# Patient Record
Sex: Female | Born: 1965
Health system: Southern US, Community
[De-identification: ages and names within clinical notes are randomized; demographics above are authoritative.]

## PROBLEM LIST (undated history)

## (undated) DIAGNOSIS — Z87440 Personal history of urinary (tract) infections: Secondary | ICD-10-CM

## (undated) DIAGNOSIS — E039 Hypothyroidism, unspecified: Secondary | ICD-10-CM

## (undated) DIAGNOSIS — Z8489 Family history of other specified conditions: Secondary | ICD-10-CM

## (undated) DIAGNOSIS — U071 COVID-19: Secondary | ICD-10-CM

## (undated) DIAGNOSIS — N926 Irregular menstruation, unspecified: Secondary | ICD-10-CM

## (undated) DIAGNOSIS — F32A Depression, unspecified: Secondary | ICD-10-CM

## (undated) DIAGNOSIS — K219 Gastro-esophageal reflux disease without esophagitis: Secondary | ICD-10-CM

## (undated) DIAGNOSIS — N83209 Unspecified ovarian cyst, unspecified side: Secondary | ICD-10-CM

## (undated) DIAGNOSIS — K2270 Barrett's esophagus without dysplasia: Secondary | ICD-10-CM

## (undated) DIAGNOSIS — D219 Benign neoplasm of connective and other soft tissue, unspecified: Secondary | ICD-10-CM

## (undated) DIAGNOSIS — F419 Anxiety disorder, unspecified: Secondary | ICD-10-CM

## (undated) DIAGNOSIS — B009 Herpesviral infection, unspecified: Secondary | ICD-10-CM

## (undated) HISTORY — DX: Unspecified ovarian cyst, unspecified side: N83.209

## (undated) HISTORY — DX: Depression, unspecified: F32.A

## (undated) HISTORY — PX: CHOLECYSTECTOMY: SHX55

## (undated) HISTORY — DX: Anxiety disorder, unspecified: F41.9

## (undated) HISTORY — DX: Irregular menstruation, unspecified: N92.6

## (undated) HISTORY — DX: Gastro-esophageal reflux disease without esophagitis: K21.9

## (undated) HISTORY — DX: Benign neoplasm of connective and other soft tissue, unspecified: D21.9

## (undated) HISTORY — PX: UPPER GASTROINTESTINAL ENDOSCOPY: SHX188

## (undated) HISTORY — DX: Herpesviral infection, unspecified: B00.9

## (undated) HISTORY — PX: WISDOM TOOTH EXTRACTION: SHX21

## (undated) HISTORY — PX: TUBAL LIGATION: SHX77

## (undated) HISTORY — DX: Personal history of urinary (tract) infections: Z87.440

## (undated) HISTORY — PX: VITRECTOMY AND CATARACT: SHX6184

---

## 1989-12-20 HISTORY — PX: TUBAL LIGATION: SHX77

## 1999-03-17 ENCOUNTER — Other Ambulatory Visit: Admission: RE | Admit: 1999-03-17 | Discharge: 1999-03-17 | Payer: Self-pay | Admitting: Obstetrics and Gynecology

## 2000-08-30 ENCOUNTER — Other Ambulatory Visit: Admission: RE | Admit: 2000-08-30 | Discharge: 2000-08-30 | Payer: Self-pay | Admitting: Obstetrics and Gynecology

## 2001-09-07 ENCOUNTER — Other Ambulatory Visit: Admission: RE | Admit: 2001-09-07 | Discharge: 2001-09-07 | Payer: Self-pay | Admitting: Obstetrics and Gynecology

## 2003-11-25 ENCOUNTER — Encounter: Admission: RE | Admit: 2003-11-25 | Discharge: 2003-11-25 | Payer: Self-pay | Admitting: Family Medicine

## 2004-09-08 ENCOUNTER — Other Ambulatory Visit: Admission: RE | Admit: 2004-09-08 | Discharge: 2004-09-08 | Payer: Self-pay | Admitting: Internal Medicine

## 2004-11-05 ENCOUNTER — Ambulatory Visit: Payer: Self-pay | Admitting: Family Medicine

## 2005-04-22 ENCOUNTER — Ambulatory Visit: Payer: Self-pay | Admitting: Family Medicine

## 2005-10-27 ENCOUNTER — Other Ambulatory Visit: Admission: RE | Admit: 2005-10-27 | Discharge: 2005-10-27 | Payer: Self-pay | Admitting: Family Medicine

## 2005-10-27 ENCOUNTER — Ambulatory Visit: Payer: Self-pay | Admitting: Family Medicine

## 2005-11-04 ENCOUNTER — Ambulatory Visit: Payer: Self-pay | Admitting: Family Medicine

## 2006-04-26 ENCOUNTER — Ambulatory Visit: Payer: Self-pay | Admitting: Family Medicine

## 2006-04-27 ENCOUNTER — Encounter: Admission: RE | Admit: 2006-04-27 | Discharge: 2006-04-27 | Payer: Self-pay | Admitting: Family Medicine

## 2006-12-01 ENCOUNTER — Ambulatory Visit: Payer: Self-pay | Admitting: Family Medicine

## 2006-12-19 ENCOUNTER — Other Ambulatory Visit: Admission: RE | Admit: 2006-12-19 | Discharge: 2006-12-19 | Payer: Self-pay | Admitting: Family Medicine

## 2006-12-19 ENCOUNTER — Ambulatory Visit: Payer: Self-pay | Admitting: Family Medicine

## 2006-12-19 ENCOUNTER — Encounter: Payer: Self-pay | Admitting: Family Medicine

## 2006-12-19 LAB — CONVERTED CEMR LAB: Pap Smear: NORMAL

## 2007-11-10 ENCOUNTER — Encounter: Payer: Self-pay | Admitting: Family Medicine

## 2007-11-10 DIAGNOSIS — H7290 Unspecified perforation of tympanic membrane, unspecified ear: Secondary | ICD-10-CM | POA: Insufficient documentation

## 2007-11-10 DIAGNOSIS — N6459 Other signs and symptoms in breast: Secondary | ICD-10-CM | POA: Insufficient documentation

## 2007-11-13 ENCOUNTER — Ambulatory Visit: Payer: Self-pay | Admitting: Family Medicine

## 2007-11-14 LAB — CONVERTED CEMR LAB
Basophils Absolute: 0 10*3/uL (ref 0.0–0.1)
Basophils Relative: 0.6 % (ref 0.0–1.0)
Eosinophils Absolute: 0.1 10*3/uL (ref 0.0–0.6)
Eosinophils Relative: 1.6 % (ref 0.0–5.0)
FSH: 11.9 milliintl units/mL
HCT: 34.9 % — ABNORMAL LOW (ref 36.0–46.0)
Hemoglobin: 11.7 g/dL — ABNORMAL LOW (ref 12.0–15.0)
LH: 5.6 milliintl units/mL
Lymphocytes Relative: 43.4 % (ref 12.0–46.0)
MCHC: 33.6 g/dL (ref 30.0–36.0)
MCV: 86.5 fL (ref 78.0–100.0)
Monocytes Absolute: 0.4 10*3/uL (ref 0.2–0.7)
Monocytes Relative: 6.7 % (ref 3.0–11.0)
Neutro Abs: 3.3 10*3/uL (ref 1.4–7.7)
Neutrophils Relative %: 47.7 % (ref 43.0–77.0)
Platelets: 351 10*3/uL (ref 150–400)
Prolactin: 7.5 ng/mL
RBC: 4.04 M/uL (ref 3.87–5.11)
RDW: 12.7 % (ref 11.5–14.6)
TSH: 1.74 microintl units/mL (ref 0.35–5.50)
WBC: 6.7 10*3/uL (ref 4.5–10.5)

## 2007-12-19 ENCOUNTER — Ambulatory Visit: Payer: Self-pay | Admitting: Family Medicine

## 2007-12-19 LAB — CONVERTED CEMR LAB
Bilirubin Urine: NEGATIVE
Epithelial cells, urine: 0 /lpf
Glucose, Urine, Semiquant: NEGATIVE
Ketones, urine, test strip: NEGATIVE
Nitrite: NEGATIVE
Protein, U semiquant: NEGATIVE
Specific Gravity, Urine: <1.005
Urobilinogen, UA: NEGATIVE
pH: 8.5

## 2007-12-22 ENCOUNTER — Telehealth (INDEPENDENT_AMBULATORY_CARE_PROVIDER_SITE_OTHER): Payer: Self-pay | Admitting: *Deleted

## 2007-12-22 ENCOUNTER — Ambulatory Visit: Payer: Self-pay | Admitting: Family Medicine

## 2007-12-22 LAB — CONVERTED CEMR LAB
Bacteria, UA: 0
Bilirubin Urine: NEGATIVE
Epithelial cells, urine: 0 /lpf
Glucose, Urine, Semiquant: NEGATIVE
Ketones, urine, test strip: NEGATIVE
Nitrite: NEGATIVE
Protein, U semiquant: NEGATIVE
Specific Gravity, Urine: 1.015
Urobilinogen, UA: NEGATIVE
WBC Urine, dipstick: NEGATIVE
WBC, UA: 0 cells/hpf
pH: 6.5

## 2007-12-25 ENCOUNTER — Encounter: Payer: Self-pay | Admitting: Family Medicine

## 2007-12-26 ENCOUNTER — Encounter: Payer: Self-pay | Admitting: Family Medicine

## 2007-12-27 ENCOUNTER — Other Ambulatory Visit: Admission: RE | Admit: 2007-12-27 | Discharge: 2007-12-27 | Payer: Self-pay | Admitting: Family Medicine

## 2007-12-27 ENCOUNTER — Ambulatory Visit: Payer: Self-pay | Admitting: Family Medicine

## 2007-12-27 ENCOUNTER — Encounter: Payer: Self-pay | Admitting: Family Medicine

## 2007-12-28 ENCOUNTER — Encounter: Payer: Self-pay | Admitting: Family Medicine

## 2008-01-02 ENCOUNTER — Encounter (INDEPENDENT_AMBULATORY_CARE_PROVIDER_SITE_OTHER): Payer: Self-pay | Admitting: *Deleted

## 2008-01-02 ENCOUNTER — Encounter: Payer: Self-pay | Admitting: Family Medicine

## 2008-01-02 LAB — CONVERTED CEMR LAB: Pap Smear: NORMAL

## 2008-01-05 ENCOUNTER — Encounter (INDEPENDENT_AMBULATORY_CARE_PROVIDER_SITE_OTHER): Payer: Self-pay | Admitting: *Deleted

## 2008-01-05 LAB — HM MAMMOGRAPHY: HM Mammogram: NORMAL

## 2008-03-08 ENCOUNTER — Encounter: Payer: Self-pay | Admitting: Family Medicine

## 2008-03-25 ENCOUNTER — Encounter: Payer: Self-pay | Admitting: Family Medicine

## 2008-04-16 ENCOUNTER — Encounter: Payer: Self-pay | Admitting: Family Medicine

## 2008-04-29 ENCOUNTER — Ambulatory Visit: Payer: Self-pay | Admitting: Family Medicine

## 2008-05-14 ENCOUNTER — Encounter: Payer: Self-pay | Admitting: Family Medicine

## 2008-05-30 ENCOUNTER — Encounter: Payer: Self-pay | Admitting: Family Medicine

## 2009-01-17 ENCOUNTER — Ambulatory Visit: Payer: Self-pay | Admitting: Family Medicine

## 2009-01-17 ENCOUNTER — Encounter: Payer: Self-pay | Admitting: Family Medicine

## 2009-01-17 ENCOUNTER — Other Ambulatory Visit: Admission: RE | Admit: 2009-01-17 | Discharge: 2009-01-17 | Payer: Self-pay | Admitting: Family Medicine

## 2009-01-20 DIAGNOSIS — D219 Benign neoplasm of connective and other soft tissue, unspecified: Secondary | ICD-10-CM

## 2009-01-20 HISTORY — DX: Benign neoplasm of connective and other soft tissue, unspecified: D21.9

## 2009-01-20 LAB — CONVERTED CEMR LAB
ALT: 17 U/L
AST: 24 U/L
Albumin: 4.1 g/dL
Alkaline Phosphatase: 58 U/L
BUN: 8 mg/dL
Basophils Absolute: 0 K/uL
Basophils Relative: 0.7 %
Bilirubin, Direct: 0.1 mg/dL
CO2: 27 meq/L
Calcium: 9.4 mg/dL
Chloride: 105 meq/L
Cholesterol: 206 mg/dL
Creatinine, Ser: 0.8 mg/dL
Direct LDL: 90.3 mg/dL
Eosinophils Absolute: 0.1 K/uL
Eosinophils Relative: 0.8 %
GFR calc Af Amer: 101 mL/min
GFR calc non Af Amer: 83 mL/min
Glucose, Bld: 90 mg/dL
HCT: 39.1 %
HDL: 98.1 mg/dL
Hemoglobin: 13.2 g/dL
Lymphocytes Relative: 43.8 %
MCHC: 33.7 g/dL
MCV: 87.2 fL
Monocytes Absolute: 0.5 K/uL
Monocytes Relative: 7.3 %
Neutro Abs: 3.3 K/uL
Neutrophils Relative %: 47.4 %
Platelets: 259 K/uL
Potassium: 4.1 meq/L
RBC: 4.49 M/uL
RDW: 14.1 %
Sodium: 138 meq/L
TSH: 1.8 u[IU]/mL
Total Bilirubin: 0.8 mg/dL
Total CHOL/HDL Ratio: 2.1
Total Protein: 7.2 g/dL
Triglycerides: 56 mg/dL
VLDL: 11 mg/dL
WBC: 7 10*3/microliter

## 2009-01-28 ENCOUNTER — Encounter (INDEPENDENT_AMBULATORY_CARE_PROVIDER_SITE_OTHER): Payer: Self-pay | Admitting: *Deleted

## 2009-02-05 ENCOUNTER — Encounter: Payer: Self-pay | Admitting: Family Medicine

## 2009-02-24 ENCOUNTER — Encounter: Payer: Self-pay | Admitting: Family Medicine

## 2009-03-06 ENCOUNTER — Encounter (INDEPENDENT_AMBULATORY_CARE_PROVIDER_SITE_OTHER): Payer: Self-pay | Admitting: *Deleted

## 2009-05-05 ENCOUNTER — Encounter: Payer: Self-pay | Admitting: Family Medicine

## 2009-06-24 ENCOUNTER — Encounter: Payer: Self-pay | Admitting: Family Medicine

## 2009-07-20 HISTORY — PX: PARTIAL HYSTERECTOMY: SHX80

## 2009-07-29 ENCOUNTER — Ambulatory Visit (HOSPITAL_COMMUNITY): Admission: RE | Admit: 2009-07-29 | Discharge: 2009-07-30 | Payer: Self-pay | Admitting: Obstetrics and Gynecology

## 2009-07-29 ENCOUNTER — Encounter: Payer: Self-pay | Admitting: Family Medicine

## 2009-07-29 ENCOUNTER — Encounter: Payer: Self-pay | Admitting: Obstetrics and Gynecology

## 2009-12-09 ENCOUNTER — Ambulatory Visit: Payer: Self-pay | Admitting: Family Medicine

## 2009-12-15 ENCOUNTER — Telehealth: Payer: Self-pay | Admitting: Family Medicine

## 2009-12-20 HISTORY — PX: COLONOSCOPY: SHX174

## 2010-02-12 ENCOUNTER — Ambulatory Visit: Payer: Self-pay | Admitting: Family Medicine

## 2010-02-12 DIAGNOSIS — K5900 Constipation, unspecified: Secondary | ICD-10-CM | POA: Insufficient documentation

## 2010-02-12 LAB — CONVERTED CEMR LAB
Bacteria, UA: 0
Bilirubin Urine: NEGATIVE
Casts: 0 /LPF
Epithelial cells, urine: 1 /LPF
Glucose, Urine, Semiquant: NEGATIVE
Ketones, urine, test strip: NEGATIVE
Mucus, UA: 0
Nitrite: NEGATIVE
RBC / HPF: 0
Specific Gravity, Urine: 1.015
Urine crystals, microscopic: 0 /HPF
Urobilinogen, UA: 0.2
WBC Urine, dipstick: NEGATIVE
WBC, UA: 0 {cells}/[HPF]
Yeast, UA: 0
pH: 5

## 2010-02-13 LAB — CONVERTED CEMR LAB
ALT: 14 units/L (ref 0–35)
AST: 17 units/L (ref 0–37)
Albumin: 4 g/dL (ref 3.5–5.2)
Alkaline Phosphatase: 52 units/L (ref 39–117)
BUN: 8 mg/dL (ref 6–23)
Basophils Absolute: 0 10*3/uL (ref 0.0–0.1)
Basophils Relative: 0 % (ref 0.0–3.0)
Bilirubin, Direct: 0.1 mg/dL (ref 0.0–0.3)
CO2: 30 meq/L (ref 19–32)
Calcium: 8.7 mg/dL (ref 8.4–10.5)
Chloride: 108 meq/L (ref 96–112)
Creatinine, Ser: 0.7 mg/dL (ref 0.4–1.2)
Eosinophils Absolute: 0.1 10*3/uL (ref 0.0–0.7)
Eosinophils Relative: 1.4 % (ref 0.0–5.0)
GFR calc non Af Amer: 96.41 mL/min (ref >60)
Glucose, Bld: 81 mg/dL (ref 70–99)
HCT: 39.1 % (ref 36.0–46.0)
Hemoglobin: 12.9 g/dL (ref 12.0–15.0)
Lymphocytes Relative: 40.6 % (ref 12.0–46.0)
Lymphs Abs: 2.5 10*3/uL (ref 0.7–4.0)
MCHC: 32.9 g/dL (ref 30.0–36.0)
MCV: 91 fL (ref 78.0–100.0)
Monocytes Absolute: 0.4 10*3/uL (ref 0.1–1.0)
Monocytes Relative: 7 % (ref 3.0–12.0)
Neutro Abs: 3.2 10*3/uL (ref 1.4–7.7)
Neutrophils Relative %: 51 % (ref 43.0–77.0)
Platelets: 242 10*3/uL (ref 150.0–400.0)
Potassium: 3.8 meq/L (ref 3.5–5.1)
RBC: 4.3 M/uL (ref 3.87–5.11)
RDW: 13.3 % (ref 11.5–14.6)
Sodium: 141 meq/L (ref 135–145)
TSH: 2.13 microintl units/mL (ref 0.35–5.50)
Total Bilirubin: 0.4 mg/dL (ref 0.3–1.2)
Total Protein: 6.8 g/dL (ref 6.0–8.3)
WBC: 6.2 10*3/uL (ref 4.5–10.5)

## 2010-02-16 ENCOUNTER — Telehealth (INDEPENDENT_AMBULATORY_CARE_PROVIDER_SITE_OTHER): Payer: Self-pay | Admitting: *Deleted

## 2010-02-16 ENCOUNTER — Telehealth: Payer: Self-pay | Admitting: Family Medicine

## 2010-02-17 ENCOUNTER — Ambulatory Visit: Payer: Self-pay | Admitting: Internal Medicine

## 2010-02-17 ENCOUNTER — Encounter: Payer: Self-pay | Admitting: Family Medicine

## 2010-02-24 ENCOUNTER — Ambulatory Visit: Payer: Self-pay | Admitting: Internal Medicine

## 2010-02-24 LAB — HM COLONOSCOPY

## 2010-02-25 ENCOUNTER — Encounter (INDEPENDENT_AMBULATORY_CARE_PROVIDER_SITE_OTHER): Payer: Self-pay | Admitting: *Deleted

## 2010-03-02 ENCOUNTER — Ambulatory Visit: Payer: Self-pay | Admitting: Internal Medicine

## 2010-03-03 ENCOUNTER — Telehealth: Payer: Self-pay | Admitting: Internal Medicine

## 2010-03-05 ENCOUNTER — Ambulatory Visit (HOSPITAL_COMMUNITY): Admission: RE | Admit: 2010-03-05 | Discharge: 2010-03-05 | Payer: Self-pay | Admitting: Internal Medicine

## 2010-03-06 ENCOUNTER — Telehealth: Payer: Self-pay | Admitting: Internal Medicine

## 2010-10-26 ENCOUNTER — Ambulatory Visit: Payer: Self-pay | Admitting: Family Medicine

## 2010-10-26 LAB — CONVERTED CEMR LAB: Rapid Strep: POSITIVE

## 2010-11-16 ENCOUNTER — Ambulatory Visit: Payer: Self-pay | Admitting: Internal Medicine

## 2010-11-16 DIAGNOSIS — J209 Acute bronchitis, unspecified: Secondary | ICD-10-CM | POA: Insufficient documentation

## 2011-01-09 ENCOUNTER — Encounter: Payer: Self-pay | Admitting: Family Medicine

## 2011-01-19 NOTE — Assessment & Plan Note (Signed)
Summary: lower left back pain/ alc   Vital Signs:  Patient profile:   45 year old female Height:      67 inches Weight:      154.25 pounds BMI:     24.25 Temp:     98.6 degrees F oral Pulse rate:   72 / minute Pulse rhythm:   regular BP sitting:   116 / 78  (left arm) Cuff size:   regular  Vitals Entered By: Lewanda Rife LPN (February 12, 2010 8:51 AM)  History of Present Illness: has had a lot of constipation -- about 6 weeks - unusual for her  took some ex lax a few times then tried benefiber then colon cleanse  then her back started hurting L lower -- wakes her up in middle of the night  gets intense before bm - and now moving toward the front side   is having bm about every 6 days - very unusual  they are hard to pass also  no blood  no diet change -- does actually drinking more water , and is exercising  some gas   no n/v   never had colonosc in past  no colon problems in family   sometimes back hurts more than others - will come and go  no meds for it  no meds at all anyway   urine test clear today no burniing or other symptoms   had hysterectomy this year -- for fibroids  had urine tested in gyn office     Allergies (verified): No Known Drug Allergies  Past History:  Past Medical History: Last updated: 07/29/2009 irreg menses    gyn- Dr Genice Rouge  Past Surgical History: Last updated: 07/29/2009 Tubal ligation 2/10 pelvic US-- small ov cyst, small fibroid 5/10 pelvic US - resolution R cyst, new L cyst 8/10- partial hyst , one tube removed   Family History: Last updated: 02/08/2009 Father: DM Mother: HTN, breast ca Siblings: brother with HTN  Social History: Last updated: 04/29/2008 Marital Status: Married Children: 2 Occupation: parts Airline pilot person Never Smoked  Risk Factors: Smoking Status: never (04/29/2008)  Review of Systems General:  Denies chills, fatigue, fever, loss of appetite, and malaise. Eyes:  Denies blurring and eye  irritation. CV:  Denies chest pain or discomfort, shortness of breath with exertion, and swelling of feet. Resp:  Denies cough, shortness of breath, and wheezing. GI:  Complains of abdominal pain, change in bowel habits, constipation, and gas; denies bloody stools, dark tarry stools, diarrhea, hemorrhoids, indigestion, nausea, and vomiting. GU:  Denies dysuria and hematuria. MS:  Complains of low back pain; denies joint swelling and muscle weakness. Derm:  Denies itching, lesion(s), poor wound healing, and rash. Neuro:  Denies headaches, numbness, and tingling. Endo:  Denies cold intolerance, excessive thirst, excessive urination, and heat intolerance. Heme:  Denies abnormal bruising and bleeding.  Physical Exam  General:  Well-developed,well-nourished,in no acute distress; alert,appropriate and cooperative throughout examination Head:  normocephalic, atraumatic, and no abnormalities observed.   Eyes:  vision grossly intact, pupils equal, pupils round, and pupils reactive to light.  no conjunctival pallor, injection or icterus  Mouth:  pharynx pink and moist.   Neck:  supple with full rom and no masses or thyromegally, no JVD or carotid bruit  Chest Wall:  No deformities, masses, or tenderness noted. Lungs:  Normal respiratory effort, chest expands symmetrically. Lungs are clear to auscultation, no crackles or wheezes. Heart:  Normal rate and regular rhythm. S1 and S2 normal without  gallop, murmur, click, rub or other extra sounds. Abdomen:  mild tenderness LLQ and flank (not cva) no rebound or gaurding  soft, normal bowel sounds, no distention, no masses, no hepatomegaly, and no splenomegaly.   Msk:  no CVA tenderness no spinal tenderness nl rom spint  Pulses:  R and L carotid,radial,femoral,dorsalis pedis and posterior tibial pulses are full and equal bilaterally Extremities:  No clubbing, cyanosis, edema, or deformity noted with normal full range of motion of all joints.   Neurologic:   sensation intact to light touch, gait normal, and DTRs symmetrical and normal.   Skin:  Intact without suspicious lesions or rashes Cervical Nodes:  No lymphadenopathy noted Psych:  nl affect    Impression & Recommendations:  Problem # 1:  CONSTIPATION (ICD-564.00) Assessment New new and persistant despite otc meds suggested fluids and fiber and otc miralax  lab today incl tsh if not imp - needs GI consult  Orders: Venipuncture (98119) TLB-BMP (Basic Metabolic Panel-BMET) (80048-METABOL) TLB-CBC Platelet - w/Differential (85025-CBCD) TLB-Hepatic/Liver Function Pnl (80076-HEPATIC) TLB-TSH (Thyroid Stimulating Hormone) (84443-TSH) UA Dipstick W/ Micro (manual) (14782)  Problem # 2:  FLANK PAIN (ICD-789.09) Assessment: New suspect rel to above ua nl  will see if imp with imp in constipation  ? if rel to recent hyst or adhesions no change with position  if not imp - will ref /gi lab today Orders: Venipuncture (95621) TLB-BMP (Basic Metabolic Panel-BMET) (80048-METABOL) TLB-CBC Platelet - w/Differential (85025-CBCD) TLB-Hepatic/Liver Function Pnl (80076-HEPATIC) TLB-TSH (Thyroid Stimulating Hormone) (84443-TSH) UA Dipstick W/ Micro (manual) (30865)  Patient Instructions: 1)  drink lots of water and start miralax once daily with water as directed  2)  labs today  3)  continue good fluid and fiber intake 4)  if increased pain let me know   Prior Medications (reviewed today): None Current Allergies (reviewed today): No known allergies   Laboratory Results   Urine Tests  Date/Time Received: February 12, 2010 8:52 AM  Date/Time Reported: February 12, 2010 8:52 AM   Routine Urinalysis   Color: yellow Appearance: Hazy Glucose: negative   (Normal Range: Negative) Bilirubin: negative   (Normal Range: Negative) Ketone: negative   (Normal Range: Negative) Spec. Gravity: 1.015   (Normal Range: 1.003-1.035) Blood: trace-intact   (Normal Range: Negative) pH: 5.0    (Normal Range: 5.0-8.0) Protein: trace   (Normal Range: Negative) Urobilinogen: 0.2   (Normal Range: 0-1) Nitrite: negative   (Normal Range: Negative) Leukocyte Esterace: negative   (Normal Range: Negative)  Urine Microscopic WBC/HPF: 0 RBC/HPF: 0 Bacteria/HPF: 0 Mucous/HPF: 0 Epithelial/HPF: 1 Crystals/HPF: 0 Casts/LPF: 0 Yeast/HPF: 0 Other: 0

## 2011-01-19 NOTE — Miscellaneous (Signed)
Summary: gi med  Clinical Lists Changes  Medications: Added new medication of BENTYL 20 MG  TABS (DICYCLOMINE HCL) by mouth two times a day - Signed Rx of BENTYL 20 MG  TABS (DICYCLOMINE HCL) by mouth two times a day;  #30 x 1;  Signed;  Entered by: Eual Fines RN;  Authorized by: Hart Carwin MD;  Method used: Electronically to [facility]*, [address] [place], [place]  [address], Ph: [phone], Fax: [phone] Observations: Added new observation of NKA: T (02/24/2010 15:43)    Prescriptions: BENTYL 20 MG  TABS (DICYCLOMINE HCL) by mouth two times a day  #30 x 1   Entered by:   Eual Fines RN   Authorized by:   Hart Carwin MD   Signed by:   Eual Fines RN on 02/24/2010   Method used:   Electronically to        [facility]* (retail)       [address]       [place], [place]  [address]       Ph: [phone]       Fax: [phone]   RxID:   [id number]

## 2011-01-19 NOTE — Miscellaneous (Signed)
Summary: Orders Update  Patient has been advised of appointment time and date of CT abd/pelvis. She verbalizes understanding of appointment time and date as well as of verbal prep instructions given to her. Hortense Ramal CMA Duncan Dull)  February 25, 2010 9:26 AM  Clinical Lists Changes  Problems: Added new problem of ABDOMINAL PAIN, LEFT LOWER QUADRANT (ICD-789.04) Orders: Added new Referral order of CT Abdomen/Pelvis with Contrast (CT Abd/Pelvis w/con) - Signed

## 2011-01-19 NOTE — Progress Notes (Signed)
Summary: Pelvic Ultrasound Scheduled  Phone Note Outgoing Call   Call placed by: Laureen Ochs LPN,  March 03, 2010 8:14 AM Call placed to: Patient Summary of Call: Follow-up from CT on 03-02-10.  Pt. is scheduled for a Pelvic/Vaginal Ultrasound with attention to the left ovary, at [facility] on 03-05-10 at 2pm. She should arrive with a full bladder. Message left for patient to callback.  Initial call taken by: Laureen Ochs LPN,  March 03, 2010 8:16 AM  Follow-up for Phone Call        Above MD orders/appt. information reviewed with patient. Pt. instructed to call back as needed.   Follow-up by: Laureen Ochs LPN,  March 03, 2010 9:22 AM

## 2011-01-19 NOTE — Letter (Signed)
Summary: [facility] Instructions  [facility]  [address] [place], [place] [address]   Phone: [phone]  Fax: [phone]       Shanigua Kolbeck    06/17/1966    MRN: [id number]       Procedure Day /Date:02-24-10     Arrival Time: 1:30 PM     Procedure Time: 2:30 PM     Location of Procedure:                    X    [facility] (4th Floor)  PREPARATION FOR COLONOSCOPY WITH MIRALAX  Starting 5 days prior to your procedure 02-19-10  do not eat nuts, seeds, popcorn, corn, beans, peas,  salads, or any raw vegetables.  Do not take any fiber supplements (e.g. Metamucil, Citrucel, and Benefiber). ____________________________________________________________________________________________________   THE DAY BEFORE YOUR PROCEDURE         DATE:02-23-10 DAY: Monday  1   Drink clear liquids the entire day-NO SOLID FOOD  2   Do not drink anything colored red or purple.  Avoid juices with pulp.  No orange juice.  3   Drink at least 64 oz. (8 glasses) of fluid/clear liquids during the day to prevent dehydration and help the prep work efficiently.  CLEAR LIQUIDS INCLUDE: Water Jello Ice Popsicles Tea (sugar ok, no milk/cream) Powdered fruit flavored drinks Coffee (sugar ok, no milk/cream) Gatorade Juice: apple, white grape, white cranberry  Lemonade Clear bullion, consomm, broth Carbonated beverages (any kind) Strained chicken noodle soup Hard Candy  4   Mix the entire bottle of Miralax with 64 oz. of Gatorade/Powerade in the morning and put in the refrigerator to chill.  5   At 3:00 pm take 2 Dulcolax/Bisacodyl tablets.  6   At 4:30 pm take one Reglan/Metoclopramide tablet.  7  Starting at 5:00 pm drink one 8 oz glass of the Miralax mixture every 15-20 minutes until you have finished drinking the entire 64 oz.  You should finish drinking prep around 7:30 or 8:00 pm.  8   If you are nauseated, you may take the 2nd Reglan/Metoclopramide tablet at 6:30 pm.        9    At 8:00 pm take 2 more DULCOLAX/Bisacodyl tablets.     THE DAY OF YOUR PROCEDURE      DATE: 02-24-10 DAY: Tuesday  You may drink clear liquids until 12:30 PM  (2 HOURS BEFORE PROCEDURE).   MEDICATION INSTRUCTIONS  Unless otherwise instructed, you should take regular prescription medications with a small sip of water as early as possible the morning of your procedure.        OTHER INSTRUCTIONS  You will need a responsible adult at least 46 years of age to accompany you and drive you home.   This person must remain in the waiting room during your procedure.  Wear loose fitting clothing that is easily removed. Leave jewelry and other valuables at home.  However, you may wish to bring a book to read or an iPod/MP3 player to listen to music as you wait for your procedure to start.  Remove all body piercing jewelry and leave at home.  Total time from sign-in until discharge is approximately 2-3 hours.  You should go home directly after your procedure and rest.  You can resume normal activities the day after your procedure.  The day of your procedure you should not:   Drive   Make legal decisions   Operate machinery  Drink alcohol   Return to work  You will receive specific instructions about eating, activities and medications before you leave.   The above instructions have been reviewed and explained to me by   _______________________    I fully understand and can verbalize these instructions _____________________________ Date _______

## 2011-01-19 NOTE — Letter (Signed)
Summary: Results Follow up Letter  [facility] at [facility]  [address] [place], [place] [address]   Phone: [phone]  Fax: [phone]    02/25/2010 MRN: [id number]     [facility] [address] [place], [place]  [address]    Dear Ms. Scioneaux,  The following are the results of your recent test(s):  Test         Result    Pap Smear:        Normal _____  Not Normal _____ Comments: ______________________________________________________ Cholesterol: LDL(Bad cholesterol):         Your goal is less than:         HDL (Good cholesterol):       Your goal is more than: Comments:  ______________________________________________________ Mammogram:        Normal __x___  Not Normal _____ Comments: Repeat in 1 year  ___________________________________________________________________ Hemoccult:        Normal _____  Not normal _______ Comments:    _____________________________________________________________________ Other Tests:    We routinely do not discuss normal results over the telephone.  If you desire a copy of the results, or you have any questions about this information we can discuss them at your next office visit.   Sincerely,   Roxy Manns MD

## 2011-01-19 NOTE — Progress Notes (Signed)
Summary: triage  Phone Note From Other Clinic Call back at [phone]   Caller: Shirlee Limerick front Dr Royden Purl office Call For: any doc Reason for Call: Schedule Patient Appt Summary of Call: Dr Milinda Antis would like this patient seen before first available appt 3-28 do to flank pain and constipation  Initial call taken by: Tawni Levy,  February 16, 2010 2:30 PM  Follow-up for Phone Call        Pt. will see Willette Cluster NP on 02-17-10 at 1:30pm. Shirlee Limerick will advise pt. of appt/med.list/co-pay. Follow-up by: Laureen Ochs LPN,  February 16, 2010 3:15 PM

## 2011-01-19 NOTE — Assessment & Plan Note (Signed)
Summary: Abigail Kramer,Abigail Kramer   Vital Signs:  Patient profile:   45 year old female Height:      67 inches Weight:      154.25 pounds BMI:     24.25 Temp:     99.2 degrees F oral Pulse rate:   88 / minute Pulse rhythm:   regular BP sitting:   100 / 70  (left arm) Cuff size:   regular  Vitals Entered By: Lewanda Rife LPN (October 26, 2010 3:24 PM) CC: sorethroat, pressure in ears, h/a, and sometimes a productive cough with yellow phlegm.   History of Present Illness: rapid strep is pos today   sunday night - severe sore throat -- and that has improved a bit  now hoarse  low grade fever 99.2 and achey and chilled   cough is sometimes prod - yellow mucous  occ pressure in ears and dizzy   taking some nyquil and ibuprofen   a little congestion in nose at night   Allergies (verified): No Known Drug Allergies  Past History:  Past Medical History: Last updated: 02/17/2010 irreg menses  gyn- Dr Genice Rouge Urinary Tract Infection  Past Surgical History: Last updated: 07/29/2009 Tubal ligation 2/10 pelvic US-- small ov cyst, small fibroid 5/10 pelvic US - resolution R cyst, new L cyst 8/10- partial hyst , one tube removed   Family History: Last updated: 02/17/2010 Mother: HTN, breast ca Siblings: brother with HTN No FH of Colon Cancer: Family History of Colon Polyps:Mother (in her 55's)  Social History: Last updated: 02/17/2010 Occupation: Sales Married 2 childern Patient has never smoked.  Alcohol Use - no Daily Caffeine Use: 4 daily  Illicit Drug Use - no  Risk Factors: Smoking Status: never (02/17/2010)  Review of Systems General:  Complains of chills, fatigue, fever, and loss of appetite. Eyes:  Denies blurring and eye irritation. ENT:  Complains of hoarseness, nasal congestion, postnasal drainage, sinus pressure, and sore throat. CV:  Denies chest pain or discomfort, palpitations, and shortness of breath with exertion. Resp:  Denies cough, sputum productive,  and wheezing. GI:  Denies abdominal pain, change in bowel habits, and indigestion. Derm:  Denies rash. Heme:  Denies abnormal bruising and bleeding.  Physical Exam  General:  Well-developed,well-nourished,in no acute distress; alert,appropriate and cooperative throughout examination Head:  normocephalic, atraumatic, and no abnormalities observed.  no sinus tenderness  Eyes:  vision grossly intact, pupils equal, pupils round, pupils reactive to light, and no injection.   Ears:  R ear normal and L ear normal.   Nose:  nares are injected and congested bilaterally  Mouth:  diffuse but mild throat injection without swelling or exudate Neck:  No deformities, masses, or tenderness noted. Lungs:  harsh bs at bases no rales/ rhonchi or wheeze Heart:  Normal rate and regular rhythm. S1 and S2 normal without gallop, murmur, click, rub or other extra sounds. Msk:  no acute joint changes  Skin:  Intact without suspicious lesions or rashes Cervical Nodes:  No lymphadenopathy noted Psych:  normal affect, talkative and pleasant    Impression & Recommendations:  Problem # 1:  STREP THROAT (ICD-034.0) Assessment New  tx with augmentin and update if not imp in several days  recommend sympt care- see pt instructions   Her updated medication list for this problem includes:    Ibuprofen 200 Mg Tabs (Ibuprofen) ..... Otc as directed.    Augmentin 875-125 Mg Tabs (Amoxicillin-pot clavulanate) .Marland Kitchen... 1 by mouth two times a day for 10 days  Orders: Rapid Strep (42706) Prescription Created Electronically [phone])  Problem # 2:  URI (ICD-465.9) Assessment: New  ? if rel to above or not  recommend sympt care- see pt instructions   covering with augmentin in rel to strep  given guifen ac for cough -- as needed  pt advised to update me if symptoms worsen or do not improve  Her updated medication list for this problem includes:    Ibuprofen 200 Mg Tabs (Ibuprofen) ..... Otc as directed.    Nyquil  60-7.05-18-999 Mg/5ml Liqd (Pseudoeph-doxylamine-dm-apap) ..... Otc as directed.    Guaifenesin Ac 100-10 Mg/68ml Syrp (Guaifenesin-codeine) .Marland Kitchen... 1-2 teaspoons by mouth up to every 4-6 hours as needed cough warn- may sedate  Orders: Rapid Strep (83151)  Complete Medication List: 1)  Miralax Powd (Polyethylene glycol 3350) .... Once daily as needed 2)  Bentyl 20 Mg Tabs (Dicyclomine hcl) .... By mouth two times a day as needed 3)  Ibuprofen 200 Mg Tabs (Ibuprofen) .... Otc as directed. 4)  Nyquil 60-7.05-18-999 Mg/83ml Liqd (Pseudoeph-doxylamine-dm-apap) .... Otc as directed. 5)  Augmentin 875-125 Mg Tabs (Amoxicillin-pot clavulanate) .Marland Kitchen.. 1 by mouth two times a day for 10 days 6)  Guaifenesin Ac 100-10 Mg/47ml Syrp (Guaifenesin-codeine) .Marland Kitchen.. 1-2 teaspoons by mouth up to every 4-6 hours as needed cough warn- may sedate  Patient Instructions: 1)  you can try the codine cough syrup with caution and nasal saline spray for congestion 2)  take the augmentin as directed for strep 3)  stay out of work tomorrow  4)  tylenol over the counter as directed may help with aches, headache and fever 5)  call if symptoms worsen or if not improved in 4-5 days  Prescriptions: GUAIFENESIN AC 100-10 MG/5ML SYRP (GUAIFENESIN-CODEINE) 1-2 teaspoons by mouth up to every 4-6 hours as needed cough warn- may sedate  #120cc x 0   Entered and Authorized by:   Judith Part MD   Signed by:   Judith Part MD on 10/26/2010   Method used:   Print then Give to Patient   RxID:   [id number] AUGMENTIN 875-125 MG TABS (AMOXICILLIN-POT CLAVULANATE) 1 by mouth two times a day for 10 days  #20 x 0   Entered and Authorized by:   Judith Part MD   Signed by:   Judith Part MD on 10/26/2010   Method used:   Electronically to        [facility]* (retail)       [address]       [place], [place]  [address]       Ph: [phone]       Fax: [phone]   RxID:   [id number]    Orders Added: 1)   Rapid Strep [77824] 2)  Est. Patient Level III [23536] 3)  Prescription Created Electronically [phone]    Current Allergies (reviewed today): No known allergies   Laboratory Results  Date/Time Received: October 26, 2010 3:27 PM  Date/Time Reported: October 26, 2010 3:27 PM   Other Tests  Rapid Strep: positive

## 2011-01-19 NOTE — Procedures (Signed)
Summary: Colonoscopy  Patient: Abigail Kramer Note: All result statuses are Final unless otherwise noted.  Tests: (1) Colonoscopy (COL)   COL Colonoscopy           DONE     [facility]     [address]     [place], [place]  [address]           COLONOSCOPY PROCEDURE REPORT           PATIENT:  Abigail Kramer, Abigail Kramer  MR#:  [id number]     BIRTHDATE:  1966/10/07, 43 yrs. old  GENDER:  female           ENDOSCOPIST:  Hedwig Morton. Juanda Chance, MD     Referred by:  Marne A. Milinda Antis, M.D.           PROCEDURE DATE:  02/24/2010     PROCEDURE:  Colonoscopy 19147     ASA CLASS:  Class I     INDICATIONS:  abdominal pain, family Hx of polyps LLQ abd. pain     radiates to the back, s/p TAH 6 months ago           MEDICATIONS:   Versed 12 mg, Fentanyl 100 mcg           DESCRIPTION OF PROCEDURE:   After the risks benefits and     alternatives of the procedure were thoroughly explained, informed     consent was obtained.  Digital rectal exam was performed and     revealed no rectal masses.   The  endoscope was introduced through     the anus and advanced to the cecum, which was identified by both     the appendix and ileocecal valve, without limitations.  The     quality of the prep was good, using MoviPrep.  The instrument was     then slowly withdrawn as the colon was fully examined.     <<PROCEDUREIMAGES>>           FINDINGS:  No polyps or cancers were seen (see image1, image2,     image3, image4, and image5). normal descending and sigmoid colon,     no spasm or thickening of the wall, no tortuosity   Retroflexed     views in the rectum revealed no abnormalities.    The scope was     then withdrawn from the patient and the procedure completed.           COMPLICATIONS:  None           ENDOSCOPIC IMPRESSION:     1) No polyps or cancers     2) Normal colonoscopy     passage of the scope through the left colon did not reproduce     pt's pain.     RECOMMENDATIONS:     LLQ abd. pain,. proceed to CT  scan of the abdomen, consider     adhesions. Pain may be due to IBS     Bentyl 20 mg po bid, #30, 1 refill     XCT scan of the abd./pelvis     Probiotic 1x/day           REPEAT EXAM:  In 10 year(s) for.           ______________________________     Hedwig Morton. Juanda Chance, MD           CC:           n.     eSIGNED:   Ryley Bachtel  M. Marcha Licklider at 02/24/2010 03:39 PM           Aretha Parrot, [id number]  Note: An exclamation mark (!) indicates a result that was not dispersed into the flowsheet. Document Creation Date: 02/24/2010 3:40 PM _______________________________________________________________________  (1) Order result status: Final Collection or observation date-time: 02/24/2010 15:28 Requested date-time:  Receipt date-time:  Reported date-time:  Referring Physician:   Ordering Physician: Lina Sar [phone]) Specimen Source:  Source: Launa Grill Order Number: [id number] Lab site:   Appended Document: Colonoscopy    Clinical Lists Changes  Observations: Added new observation of COLONNXTDUE: 02/2020 (02/24/2010 16:02)

## 2011-01-19 NOTE — Assessment & Plan Note (Signed)
Summary: CONGESTION/CLE   Vital Signs:  Patient profile:   45 year old female Weight:      153.75 pounds Temp:     99.0 degrees F oral Pulse rate:   76 / minute Pulse rhythm:   regular BP sitting:   122 / 82  (left arm) Cuff size:   regular  Vitals Entered By: Selena Batten Dance CMA Duncan Dull) (November 16, 2010 9:14 AM) CC: Congestion   History of Present Illness: CC: congestion, HA, sore chest  seen here 3 wks ago, dx with strep throat and cough treated with augmentin 10 days, felt got better but now feeling poorly again.  4d h/o feeling more congested in chest, low grade fever to 99.9, HA, chest soreness with cough.  + sinus pressure HA.  Last night started having productive cough - slight blood, yellow sputum.    + abd pain and nausea/diarrhea/vomiting on augmentin.  since, resolved.  + neck pain in back.  No new rashes.  No myalgias/arthralgias.    + daugther sick as well.  No smokers at home.  using codeine cough syrup which is working as well as guaifenesin.   Current Medications (verified): 1)  Ibuprofen 200 Mg Tabs (Ibuprofen) .... Otc As Directed. 2)  Nyquil 60-7.05-18-999 Mg/25ml Liqd (Pseudoeph-Doxylamine-Dm-Apap) .... Otc As Directed. 3)  Guaifenesin Ac 100-10 Mg/87ml Syrp (Guaifenesin-Codeine) .Marland Kitchen.. 1-2 Teaspoons By Mouth Up To Every 4-6 Hours As Needed Cough Warn- May Sedate  Allergies (verified): No Known Drug Allergies  Past History:  Past Medical History: Last updated: 02/17/2010 irreg menses  gyn- Dr Genice Rouge Urinary Tract Infection  Social History: Last updated: 02/17/2010 Occupation: Sales Married 2 childern Patient has never smoked.  Alcohol Use - no Daily Caffeine Use: 4 daily  Illicit Drug Use - no  Review of Systems       per HPI  Physical Exam  General:  Well-developed,well-nourished,in no acute distress; alert,appropriate and cooperative throughout examination Head:  normocephalic, atraumatic, and no abnormalities observed.  no sinus tenderness    Eyes:  vision grossly intact, pupils equal, pupils round, pupils reactive to light, and no injection.   Ears:  R ear normal and L ear normal.   Nose:  nares are injected and congested bilaterally  Mouth:  no exudates.  + pharyngeal erythema.  + L swollen tonsil. Neck:  No deformities, masses, or tenderness noted. Lungs:  clear to auscultation bilaterally, no c/w.  normal WOB Heart:  Normal rate and regular rhythm. S1 and S2 normal without gallop, murmur, click, rub or other extra sounds. Abdomen:  no HSM, soft, NTND, NABS Pulses:  2+ rad pulses, brisk cap refill Extremities:  no pedal edema Skin:  Intact without suspicious lesions or rashes   Impression & Recommendations:  Problem # 1:  ACUTE BRONCHITIS (ICD-466.0) with recent augmentin use.  treat with zpack, supportive care.  lungs clear today.  RTC if not improved, red flags.  The following medications were removed from the medication list:    Augmentin 875-125 Mg Tabs (Amoxicillin-pot clavulanate) .Marland Kitchen... 1 by mouth two times a day for 10 days Her updated medication list for this problem includes:    Nyquil 60-7.05-18-999 Mg/31ml Liqd (Pseudoeph-doxylamine-dm-apap) ..... Otc as directed.    Guaifenesin Ac 100-10 Mg/20ml Syrp (Guaifenesin-codeine) .Marland Kitchen... 1-2 teaspoons by mouth up to every 4-6 hours as needed cough warn- may sedate    Zithromax Z-pak 250 Mg Tabs (Azithromycin) .Marland Kitchen... Take one as directed  Complete Medication List: 1)  Ibuprofen 200 Mg Tabs (Ibuprofen) .... Otc  as directed. 2)  Nyquil 60-7.05-18-999 Mg/73ml Liqd (Pseudoeph-doxylamine-dm-apap) .... Otc as directed. 3)  Guaifenesin Ac 100-10 Mg/55ml Syrp (Guaifenesin-codeine) .Marland Kitchen.. 1-2 teaspoons by mouth up to every 4-6 hours as needed cough warn- may sedate 4)  Zithromax Z-pak 250 Mg Tabs (Azithromycin) .... Take one as directed  Patient Instructions: 1)  Sounds like you have a bronchitis - I want to treat with antibiotic x 5 days. 2)  Use medication as prescribed:  azithromycin for next 5 days.  continue codeine cough syrup.  nasal saline spray for congestion, continue mucinex with plenty of fluid. 3)  Please return if you are not improving as expected, or if you have high fevers (>101.5) or difficulty breathing/swallowing. 4)  Call clinic with questions.  Pleasure to see you today! Prescriptions: ZITHROMAX Z-PAK 250 MG TABS (AZITHROMYCIN) take one as directed  #1 x 0   Entered and Authorized by:   Eustaquio Boyden  MD   Signed by:   Eustaquio Boyden  MD on 11/16/2010   Method used:   Electronically to        [facility]* (retail)       [address]       [place], [place]  [address]       Ph: [phone]       Fax: [phone]   RxID:   [id number]    Orders Added: 1)  Est. Patient Level III [57322]    Current Allergies (reviewed today): No known allergies

## 2011-01-19 NOTE — Assessment & Plan Note (Signed)
Summary: ABD.PAIN/CONSTIPATION         (NEW TO GI)        DEBORAH   History of Present Illness Visit Type: consult  Primary GI MD: Lina Sar MD Primary Provider: Roxy Manns, MD  Requesting Provider: Roxy Manns, MD  Chief Complaint: Constipation, and left side abd pain that radiates to patients back  History of Present Illness:   Patient is new to this practice. She presents with a several week history of constipation and abdominal pain. Tried Ex-Lax followed by fiber for one month. She then completed seven day course of "Colon Cleanse" which helped at the time she was taking it. She also gives several week history of left abdominal pain which is really more flank pain based on where she shows me.  The pain has awoken her from sleep. It sometimes radiates down to proximal front left thigh. There is no relationship between pain and defecation, meals or activity. No rectal bleeding. No dysuria.  No significant weight loss. Recent UA at PCP's office was normal.  Recent PAPsmear done, results pending. Prior to six weeks ago BMs were normal. No recent medication changes. Had a hysterectomy six months ago for fibroids.    GI Review of Systems    Reports abdominal pain.     Location of  Abdominal pain: left side.    Denies acid reflux, belching, bloating, chest pain, dysphagia with liquids, dysphagia with solids, heartburn, loss of appetite, nausea, vomiting, vomiting blood, weight loss, and  weight gain.      Reports constipation.     Denies anal fissure, black tarry stools, change in bowel habit, diarrhea, diverticulosis, fecal incontinence, heme positive stool, hemorrhoids, irritable bowel syndrome, jaundice, light color stool, liver problems, rectal bleeding, and  rectal pain.    Current Medications (verified): 1)  Miralax  Powd (Polyethylene Glycol 3350) .... Once Daily  Allergies (verified): No Known Drug Allergies  Past History:  Past Medical History: irreg menses  gyn- Dr  Genice Rouge Urinary Tract Infection  Past Surgical History: Reviewed history from 07/29/2009 and no changes required. Tubal ligation 2/10 pelvic US-- small ov cyst, small fibroid 5/10 pelvic US - resolution R cyst, new L cyst 8/10- partial hyst , one tube removed   Family History: Mother: HTN, breast ca Siblings: brother with HTN No FH of Colon Cancer: Family History of Colon Polyps:Mother (in her 13's)  Social History: Occupation: Sales Married 2 childern Patient has never smoked.  Alcohol Use - no Daily Caffeine Use: 4 daily  Illicit Drug Use - no Drug Use:  no  Review of Systems       The patient complains of back pain and change in vision.  The patient denies allergy/sinus, anemia, anxiety-new, arthritis/joint pain, blood in urine, breast changes/lumps, confusion, cough, coughing up blood, depression-new, fainting, fatigue, fever, headaches-new, hearing problems, heart murmur, heart rhythm changes, itching, menstrual pain, muscle pains/cramps, night sweats, nosebleeds, pregnancy symptoms, shortness of breath, skin rash, sleeping problems, sore throat, swelling of feet/legs, swollen lymph glands, thirst - excessive , urination - excessive , urination changes/pain, urine leakage, vision changes, and voice change.    Vital Signs:  Patient profile:   45 year old female Height:      67 inches Weight:      154 pounds BMI:     24.21 BSA:     1.81 Pulse rate:   88 / minute Pulse rhythm:   regular BP sitting:   110 / 76  (left arm) Cuff size:  regular  Vitals Entered By: Ok Anis CMA (February 17, 2010 1:41 PM)  Physical Exam  General:  Well developed, well nourished, no acute distress. Head:  Normocephalic and atraumatic. Eyes:  Conjunctiva pink, no icterus.  Mouth:  No oral lesions. Tongue moist.  Neck:  no obvious masses  Lungs:  Clear throughout to auscultation. Heart:  Regular rate and rhythm; no murmurs, rubs,  or bruits. Abdomen:  Abdomen soft, nontender,  nondistended. No obvious masses or hepatomegaly.Normal bowel sounds.   No CVA tenderness  Rectal:  No external or internal masses felt. No stool in vault. Msk:  Symmetrical with no gross deformities. Normal posture. Extremities:  No palmar erythema, no edema.  Neurologic:  Alert and  oriented x4;  grossly normal neurologically. Skin:  Intact without significant lesions or rashes. Cervical Nodes:  No significant cervical adenopathy. Psych:  Alert and cooperative. Normal mood and affect.   Impression & Recommendations:  Problem # 1:  CHANGE IN BOWELS (JYN-829.56) Assessment New Six week history of constipation in patient with previously normal BMs.  Recent labs including CBC, BMET, LFTs and TSH were normal. BMs are currently better on MIralax. Given sudden bowel habit change and history of colon polyps in mother, patient should undergo colonoscopy for further evaluation. She will be scheduled for a colonoscopy with biopsies/polypectomy (if indicated).  The risks and benefits of the procedure, as well as alternatives were discussed with the patient and she agrees to proceed.      Orders: Colonoscopy (Colon)  Problem # 2:  FLANK PAIN (ICD-789.09) Assessment: Deteriorated Several week history of left flank pain. U/A was normal. Pain wakes her up at night, it is not related to meals or defecation.  Pain can radiate to left proximal thigh. Her pain doesn't sound GI related but cannot exclude since it correlates timewise with change in bowel habits. If colonoscopy is normal patient may need imaging.   Patient Instructions: 1)  We have the Colonoscopy scheduled for 02-24-10 in our [facility] . 2)  Colonoscopy brochure given. 3)  We sent the perscription for the Miralax preperation you will be drinking to your pharmacy. 4)  [facility] Patient Information Guide given to patient. 5)  Copy sent to : Darrol Jump, MD 6)  The medication list was reviewed and reconciled.   All changed / newly prescribed medications were explained.  A complete medication list was provided to the patient / caregiver. Prescriptions: DULCOLAX 5 MG  TBEC (BISACODYL) Day before procedure take 2 at 3pm and 2 at 8pm.  #4 x 0   Entered by:   Lowry Ram NCMA   Authorized by:   Willette Cluster NP   Signed by:   Lowry Ram NCMA on 02/17/2010   Method used:   Electronically to        [facility]* (retail)       [address]       [place], [place]  [address]       Ph: [phone]       Fax: [phone]   RxID:   [id number] METOCLOPRAMIDE HCL 10 MG  TABS (METOCLOPRAMIDE HCL) As per prep instructions.  #2 x 0   Entered by:   Lowry Ram NCMA   Authorized by:   Willette Cluster NP   Signed by:   Lowry Ram NCMA on 02/17/2010   Method used:   Electronically to        [facility]* (retail)       [address]       [place], [place]  [address]       Ph: [phone]       Fax: [phone]   RxID:   [id number] MIRALAX   POWD (POLYETHYLENE GLYCOL 3350) As per prep  instructions.  #255gm x 0   Entered by:   Lowry Ram NCMA   Authorized by:   Willette Cluster NP   Signed by:   Lowry Ram NCMA on 02/17/2010   Method used:   Electronically to        [facility]* (retail)       [address]       [place], [place]  [address]       Ph: [phone]       Fax: [phone]   RxID:   [id number]

## 2011-01-19 NOTE — Progress Notes (Signed)
Summary: TRIAGE-F/U FROM ULTRASOUND  Phone Note Outgoing Call   Call placed by: Laureen Ochs LPN,  March 06, 2010 9:06 AM Call placed to: Patient Summary of Call: Ultrasound report reviewed with pt. She states the pain remains the same, intermittent left side pain, worse at night, sore when she pushes on the area. She states she saw her GYN and PCP about this pain, before she saw Dr.Jaggar Benko. "It seems like everything checks out fine, but I still have the pain"  DR.Aaliyah Cancro PLEASE ADVISE  Initial call taken by: Laureen Ochs LPN,  March 06, 2010 9:08 AM  Follow-up for Phone Call        please start Align 1 by mouth once daily, x 30 days, then reconsider Follow-up by: Hart Carwin MD,  March 06, 2010 5:42 PM  Additional Follow-up for Phone Call Additional follow up Details #1::        Message left for pt. with above MD instructions. Pt. to callback in 30 days with a condition update, sooner as needed. Additional Follow-up by: Laureen Ochs LPN,  March 09, 2010 8:34 AM

## 2011-01-19 NOTE — Progress Notes (Signed)
Summary: pt requests referral  Phone Note Call from Patient Call back at Work Phone [phone] Call back at x [phone]   Caller: Patient Call For: Judith Part MD Summary of Call: Pt was seen last week for lower back pain and she is not any better.  She was told that she would be referred somewhere if not better.  She prefers to see someone in [place]. Initial call taken by: Lowella Petties CMA,  February 16, 2010 8:48 AM  Follow-up for Phone Call        I want to ref her to GI for this persistant flank and back pain will do ref and route to [facility] Follow-up by: Judith Part MD,  February 16, 2010 1:07 PM  Additional Follow-up for Phone Call Additional follow up Details #1::        Appt made with Lucrezia Europe N.P. [facility] on 02/17/2010 at 1:00pm. Additional Follow-up by: Carlton Adam,  February 16, 2010 3:17 PM

## 2011-02-23 ENCOUNTER — Encounter: Payer: Self-pay | Admitting: Family Medicine

## 2011-02-23 DIAGNOSIS — N926 Irregular menstruation, unspecified: Secondary | ICD-10-CM

## 2011-02-23 DIAGNOSIS — Z87440 Personal history of urinary (tract) infections: Secondary | ICD-10-CM | POA: Insufficient documentation

## 2011-02-23 DIAGNOSIS — D219 Benign neoplasm of connective and other soft tissue, unspecified: Secondary | ICD-10-CM

## 2011-02-23 DIAGNOSIS — N83209 Unspecified ovarian cyst, unspecified side: Secondary | ICD-10-CM | POA: Insufficient documentation

## 2011-02-23 LAB — HM MAMMOGRAPHY: HM Mammogram: NORMAL

## 2011-02-24 ENCOUNTER — Encounter: Payer: Self-pay | Admitting: Family Medicine

## 2011-03-01 ENCOUNTER — Encounter (INDEPENDENT_AMBULATORY_CARE_PROVIDER_SITE_OTHER): Payer: Self-pay | Admitting: *Deleted

## 2011-03-09 NOTE — Miscellaneous (Signed)
Summary: Mammogram to flowsheet  Clinical Lists Changes  Observations: Added new observation of MAMMO DUE: 02/2012 (03/01/2011 13:30) Added new observation of MAMMOGRAM: normal (02/24/2011 13:31)      Preventive Care Screening  Mammogram:    Date:  02/24/2011    Next Due:  02/2012    Results:  normal

## 2011-03-09 NOTE — Letter (Signed)
Summary: Results Follow up Letter  [facility]  [address] [place], [place] [address]   Phone: [phone]  Fax: [phone]    03/01/2011 MRN: [id number]     Abigail Kramer [address] [place], [place]  [address]      Dear Abigail Kramer,  The following are the results of your recent test(s):  Test         Result    Pap Smear:        Normal _____  Not Normal _____ Comments: ______________________________________________________ Cholesterol: LDL(Bad cholesterol):         Your goal is less than:         HDL (Good cholesterol):       Your goal is more than: Comments:  ______________________________________________________ Mammogram:        Normal __X___  Not Normal _____ Comments: Repeat in 1 year  ___________________________________________________________________ Hemoccult:        Normal _____  Not normal _______ Comments:    _____________________________________________________________________ Other Tests:    We routinely do not discuss normal results over the telephone.  If you desire a copy of the results, or you have any questions about this information we can discuss them at your next office visit.   Sincerely,      Sharilyn Sites for  Dr. Roxy Manns

## 2011-03-28 LAB — CBC
HCT: 30.4 % — ABNORMAL LOW (ref 36.0–46.0)
HCT: 38.3 % (ref 36.0–46.0)
Hemoglobin: 10.2 g/dL — ABNORMAL LOW (ref 12.0–15.0)
Hemoglobin: 13.1 g/dL (ref 12.0–15.0)
MCHC: 34 g/dL (ref 30.0–36.0)
MCHC: 34.2 g/dL (ref 30.0–36.0)
MCV: 87.8 fL (ref 78.0–100.0)
MCV: 88.1 fL (ref 78.0–100.0)
Platelets: 175 10*3/uL (ref 150–400)
Platelets: 232 10*3/uL (ref 150–400)
RBC: 3.45 MIL/uL — ABNORMAL LOW (ref 3.87–5.11)
RBC: 4.36 MIL/uL (ref 3.87–5.11)
RDW: 13.8 % (ref 11.5–15.5)
RDW: 14.1 % (ref 11.5–15.5)
WBC: 11.4 10*3/uL — ABNORMAL HIGH (ref 4.0–10.5)
WBC: 7.9 10*3/uL (ref 4.0–10.5)

## 2011-04-08 ENCOUNTER — Telehealth: Payer: Self-pay | Admitting: Family Medicine

## 2011-04-08 DIAGNOSIS — Z0000 Encounter for general adult medical examination without abnormal findings: Secondary | ICD-10-CM | POA: Insufficient documentation

## 2011-04-08 NOTE — Telephone Encounter (Signed)
Message copied by Roxy Manns on Thu Apr 08, 2011  5:05 PM ------      Message from: Liane Comber      Created: Mon Apr 05, 2011  9:44 AM      Regarding: Cpx Labs tues 4/24       Please order  future cpx labs for pt's upcomming lab appt.      Thanks      Rodney Booze

## 2011-04-13 ENCOUNTER — Other Ambulatory Visit (INDEPENDENT_AMBULATORY_CARE_PROVIDER_SITE_OTHER): Payer: Managed Care, Other (non HMO) | Admitting: Family Medicine

## 2011-04-13 DIAGNOSIS — Z0000 Encounter for general adult medical examination without abnormal findings: Secondary | ICD-10-CM

## 2011-04-13 LAB — COMPREHENSIVE METABOLIC PANEL
AST: 17 U/L (ref 0–37)
Albumin: 4.1 g/dL (ref 3.5–5.2)
BUN: 10 mg/dL (ref 6–23)
Calcium: 9.3 mg/dL (ref 8.4–10.5)
Chloride: 108 mEq/L (ref 96–112)
Glucose, Bld: 87 mg/dL (ref 70–99)
Potassium: 4.9 mEq/L (ref 3.5–5.1)
Total Protein: 7 g/dL (ref 6.0–8.3)

## 2011-04-13 LAB — CBC WITH DIFFERENTIAL/PLATELET
Basophils Absolute: 0 10*3/uL (ref 0.0–0.1)
Basophils Relative: 0.6 % (ref 0.0–3.0)
Eosinophils Absolute: 0.2 10*3/uL (ref 0.0–0.7)
Eosinophils Relative: 3.6 % (ref 0.0–5.0)
HCT: 39.3 % (ref 36.0–46.0)
Hemoglobin: 13.6 g/dL (ref 12.0–15.0)
Lymphocytes Relative: 37.3 % (ref 12.0–46.0)
Lymphs Abs: 2.5 10*3/uL (ref 0.7–4.0)
MCHC: 34.5 g/dL (ref 30.0–36.0)
MCV: 91.2 fl (ref 78.0–100.0)
Monocytes Absolute: 0.4 10*3/uL (ref 0.1–1.0)
Monocytes Relative: 6.3 % (ref 3.0–12.0)
Neutro Abs: 3.5 10*3/uL (ref 1.4–7.7)
Neutrophils Relative %: 52.2 % (ref 43.0–77.0)
Platelets: 236 10*3/uL (ref 150.0–400.0)
RBC: 4.31 Mil/uL (ref 3.87–5.11)
RDW: 13.9 % (ref 11.5–14.6)
WBC: 6.7 10*3/uL (ref 4.5–10.5)

## 2011-04-13 LAB — LIPID PANEL
Cholesterol: 187 mg/dL (ref 0–200)
LDL Cholesterol: 81 mg/dL (ref 0–99)

## 2011-04-13 LAB — TSH: TSH: 2.19 u[IU]/mL (ref 0.35–5.50)

## 2011-04-16 ENCOUNTER — Ambulatory Visit (INDEPENDENT_AMBULATORY_CARE_PROVIDER_SITE_OTHER): Payer: Managed Care, Other (non HMO) | Admitting: Family Medicine

## 2011-04-16 ENCOUNTER — Encounter: Payer: Self-pay | Admitting: Family Medicine

## 2011-04-16 DIAGNOSIS — R0602 Shortness of breath: Secondary | ICD-10-CM

## 2011-04-16 DIAGNOSIS — Z0000 Encounter for general adult medical examination without abnormal findings: Secondary | ICD-10-CM

## 2011-04-16 DIAGNOSIS — Z136 Encounter for screening for cardiovascular disorders: Secondary | ICD-10-CM

## 2011-04-16 DIAGNOSIS — N926 Irregular menstruation, unspecified: Secondary | ICD-10-CM

## 2011-04-16 NOTE — Assessment & Plan Note (Addendum)
Per pt going on since teen years No other assoc symptoms  Nl exam ekg is normal with rate of 66 and one PAC on strip  Pt declines cxr or other workup at this time- but agreed to re visit problem if it persists or worsens Suspect that this is worry/anxiety induced -- did offer counseling ref for stress management  Father has myesthenia gravis

## 2011-04-16 NOTE — Assessment & Plan Note (Signed)
Reviewed health habits including diet and exercise and skin cancer prevention Also reviewed health mt list, fam hx and immunizations   Reviewed wellness labs- very good Nl breast exam

## 2011-04-16 NOTE — Patient Instructions (Signed)
Your EKG looks good  If your shortness of breath persists or worsens - I would like to get a chest x ray or consider a referral to a specialist  I think it may be related for stress or anxiety -- so if you are interested in counselor in the future - it may be helpful (let me know )  Keep up the great health habits

## 2011-04-16 NOTE — Progress Notes (Signed)
Subjective:    Patient ID: Abigail Kramer, female    DOB: [birth year], 45 y.o.   MRN: [id number]  HPI Here for health mt exam and to review chronic health problems Is feeling good in general   Experiences shortness of breath- since she was a teenager  Attributed it to anxiety Worse when going to bed at night Has trouble catching a "deep breath"- tries to press on her chest  Every once in a while - tingles on L side of chest -- fleeting  Sob is not exertional at all - walks for exercise with no problems 3.5 miles per day (some days tires out faster than others)  No asthma or wheezing   No treated anx in the past  Is a "worry wort"  Does not happen during the time of anx  Has 3 teenagers - all girls- a lot going on    No heart dz or lung dz in family except gf with heart attack  Cbc good , lytes/ renal /thyroid good Cholesterol is fantastic - working on that and on [employer] and taking some supplements  For 6 months  Is taking flinstones complete vitamin once daily Calcium and vit D  Also oculite for eyes    Has lost 20 lb since October- happy where she is    Her father has myesthenia gravis  She does not have any symptoms of that   Wt is down 11 lb  bp good at 120/80  Last ppa   Td 07  Menses - gone after hysterectomy - happy with that --nothing cancerous  Partial  Had pelvic exam last year  Not seeing gyn any more   Mam 09  colonosc 3/11  Past Medical History  Diagnosis Date  . Irregular menses   . Hx: UTI (urinary tract infection)   . Ovarian cyst 01/2009 and 04/2009  . Fibroid 01/2009    small   Past Surgical History  Procedure Date  . Tubal ligation   . Partial hysterectomy 07/2009    1 tube removed    reports that she has never smoked. She does not have any smokeless tobacco history on file. She reports that she does not drink alcohol or use illicit drugs. family history includes Cancer in her mother; Colon polyps in her mother; and  Hypertension in her brother and mother. No Known Allergies      Review of Systems Review of Systems  Constitutional: Negative for fever, appetite change, fatigue and unexpected weight change.  Eyes: Negative for pain and visual disturbance.  Respiratory: Negative for cough and chest pain and wheezing  Cardiovascular: Negative.   Gastrointestinal: Negative for nausea, diarrhea and constipation.  Genitourinary: Negative for urgency and frequency.  Skin: Negative for pallor.  Neurological: Negative for weakness, light-headedness, numbness and headaches.  Hematological: Negative for adenopathy. Does not bruise/bleed easily.  Psychiatric/Behavioral: Negative for dysphoric mood. Pos for stress and more anxiety than usual                                                                        Objective:   Physical Exam  Constitutional: She appears well-developed and well-nourished. No distress.  HENT:  Head: Normocephalic and atraumatic.  Right Ear: External  ear normal.  Left Ear: External ear normal.  Nose: Nose normal.  Mouth/Throat: Oropharynx is clear and moist.  Eyes: Conjunctivae and EOM are normal. Pupils are equal, round, and reactive to light.  Neck: Normal range of motion. Neck supple. No JVD present. Carotid bruit is not present. No thyromegaly present.  Cardiovascular: Normal rate, regular rhythm and normal heart sounds.   Pulmonary/Chest: Effort normal and breath sounds normal. No respiratory distress. She has no wheezes. She has no rales. She exhibits no tenderness.  Abdominal: Soft. Bowel sounds are normal. She exhibits no abdominal bruit. There is no tenderness.  Genitourinary: No breast swelling, tenderness, discharge or bleeding.  Musculoskeletal: Normal range of motion.  Lymphadenopathy:    She has no cervical adenopathy.  Neurological: She is alert. She has normal reflexes. No cranial nerve deficit. She exhibits normal muscle tone.  Coordination normal.  Skin: Skin is warm and dry. She is not diaphoretic.  Psychiatric: She has a normal mood and affect.          Assessment & Plan:

## 2011-05-04 NOTE — Op Note (Signed)
NAME:  Abigail Kramer, FLAIM NO.:  [id number]   MEDICAL RECORD NO.:  [id number]          PATIENT TYPE:  OIB   LOCATION:  0098                         FACILITY:  [facility]   PHYSICIAN:  Cynthia P. Romine, M.D.DATE OF BIRTH:  1966/12/07   DATE OF PROCEDURE:  07/29/2009  DATE OF DISCHARGE:                               OPERATIVE REPORT   PREOPERATIVE DIAGNOSIS:  Menorrhagia unresponsive to medical therapy.   POSTOPERATIVE DIAGNOSIS:  Menorrhagia unresponsive to medical therapy;  pathology pending.   PROCEDURE:  Robotic-total laparoscopic hysterectomy.   SURGEON:  Dr. Aram Beecham Romine.   ASSISTANT:  Dr. Leda Quail.   ANESTHESIA:  General endotracheal.   ESTIMATED BLOOD LOSS:  100 mL.   COMPLICATIONS:  None.   PROCEDURE:  The patient was taken to the operating room, and after  induction of adequate general endotracheal anesthesia, she was placed in  the low dorsal lithotomy position and prepped and draped in the usual  fashion.  Posterior weighted and anterior Sims retractor were placed in  the vagina.  Cervix was grasped on its anterior lip with a single-tooth  tenaculum.  Cervix was dilated to a #25 Shawnie Pons.  The uterus sounded to 9  cm.  A 8-cm Rumi and a small KOH ring were seated around the cervix and  inside the uterus.  The balloon on the tip of the Rumi was inflated with  4 mL of saline.  The vaginal occluder was inflated with 50 mL of air  left in the vagina.  Foley catheter was placed.  Attention was next  turned to the abdomen.  An area in the subumbilicus was infiltrated with  half percent Marcaine plain and incised at the site of her previous  tubal ligation.  The fascia was grasped with Kocher's, opened with Mayo.  The underlying peritoneum was elevated between hemostats and entered  atraumatically.  The pursestring suture was placed around the fascia  using 0 Vicryl.  Hasson trocar was inserted in peritoneal space, proper  placement noted with the  laparoscopic and pneumoperitoneum created with  the automatic insufflator.  The sites of the robotic ports were  transilluminated, infiltrated with Marcaine, incised, and the robotic  trocars were inserted on the right and the left at a level approximately  equal at the umbilicus on each side.  There were inserted on direct  visualization.  The assistance port was inserted in the right lower  quadrant also under direct visualization.  The patient was placed in  steep Trendelenburg.  The robot was docked from the side.  A monopolar  scissor was placed in port #1 and a P2 gyrus in port #2.  The surgeon  proceeded to the console.  The procedure began on the patient's right,  identifying the ureter.  The tubo-ovarian ligament and tube were  cauterized and cut.  The round ligament was cauterized and cut.  The  anterior and posterior leaf of the broad ligament were taken down  sharply with monopolar scissors.  The bladder was dissected sharply off  the cervix and the KOH ring.  The procedure was repeated on the  patient's left, cauterizing and cutting the utero-ovarian ligament, the  tube and round ligament, and taking down the anterior and posterior leaf  of the broad ligament.  The uterine artery was skeletonized on each  side.  While the KOH ring was being elevated vaginally, the pedicle of  the uterine artery was cauterized and cut on each side.  Monopolar  scissors were then used to make the colpotomy incision, and it was  followed around the KOH ring with monopolar cautery.  The specimen was  brought into the vagina and left for maintenance of the  pneumoperitoneum.  The instruments were then changed to a ProGrasp in  port 2 and a suture cut needle holder in port 1.  The vaginal angles  were closed with figure-of-eight sutures of #1 Vicryl and then 1 suture  figure-of-eight in the midline was necessary to close the vagina.  The  pelvis was irrigated and was found to be hemostatic.  The  ureters were  again identified and seen peristalsing.  There were 2 small paratubal  cysts on the patient's right tube that were removed.  It should be noted  that on the patient's left side the tube had a small hydrosalpinx, and  therefore on this side, the tube was removed with the specimen.  On the  right side, the tube remained.  Because of the hydrosalpinx, it was felt  that in order to prevent her from having to have surgery later the  hydrosalpinx enlarged.  It was better to go ahead and remove the tube at  this time.  Sponge, needle, and instrument counts were correct x3.      Cynthia P. Romine, M.D.  Electronically Signed     CPR/MEDQ  D:  07/29/2009  T:  07/29/2009  Job:  [id number]   cc:   Marne A. Tower, MD  [address] [place], [place] [address]

## 2011-05-06 ENCOUNTER — Encounter: Payer: Self-pay | Admitting: Family Medicine

## 2011-05-06 ENCOUNTER — Ambulatory Visit (INDEPENDENT_AMBULATORY_CARE_PROVIDER_SITE_OTHER): Payer: Managed Care, Other (non HMO) | Admitting: Family Medicine

## 2011-05-06 VITALS — BP 112/76 | HR 76 | Temp 98.5°F | Ht 67.0 in | Wt 140.5 lb

## 2011-05-06 DIAGNOSIS — Z87440 Personal history of urinary (tract) infections: Secondary | ICD-10-CM

## 2011-05-06 DIAGNOSIS — N390 Urinary tract infection, site not specified: Secondary | ICD-10-CM

## 2011-05-06 LAB — POCT URINALYSIS DIPSTICK
Glucose, UA: NEGATIVE
Ketones, UA: NEGATIVE
Spec Grav, UA: 1.005
Urobilinogen, UA: 0.2

## 2011-05-06 MED ORDER — CIPROFLOXACIN HCL 500 MG PO TABS: 500.0000 mg | ORAL_TABLET | Freq: Two times a day (BID) | ORAL | Status: AC

## 2011-05-06 NOTE — Assessment & Plan Note (Signed)
Pt with waxing and waning sxs, curr not bad. U/A and Micro equivocal. Will culture and await report to treat. Take Azo 3 times a day for three days. Given script for Cipro to take if culture pos or sxs worsen dramatically. If culture negative and sxs worsen Sun (off Azo) make appt for recheck on Mon (recheck U/A).

## 2011-05-06 NOTE — Progress Notes (Signed)
  Subjective:    Patient ID: Abigail Kramer, female    DOB: [birth year], 45 y.o.   MRN: [id number]  HPI Pt of Dr Royden Purl here acutely for urinary tract infection. She has had Mon morning odd smell with mild burning in the afternoon. Tues she had some left sided cramping. Yest she had burning and blood in urine. Her daughter works in ER and has taken AZO. She feels fine today due to the Azo but has mild cramping. She has had UTIs in the past, last two years ago. She cannot remember the last time on Abs, poss last Fall.   Review of SystemsNoncontributory except as above.       Objective:   Physical Exam HEENT wnl Heart RRR  Lungs CTA Back -CVAT Abd Mild suprapubic tend, tender in LLQ no rebound, referred. Had LLQ pain on GI w/u in past, felt to be IBS.  U/A equivocal, Micro 0-1 WBCs, 0-1RBCs, Rare Epis.       Assessment & Plan:

## 2011-05-07 ENCOUNTER — Telehealth: Payer: Self-pay | Admitting: *Deleted

## 2011-05-07 NOTE — Telephone Encounter (Signed)
Pt is asking for urine culture results.  I advised her that the results arent in yet.  I told her that I will try and call her tomorrow if the results are in and if you have reviewed them. I will be in tomorrow morning.

## 2011-05-08 NOTE — Telephone Encounter (Signed)
I called pt. Still awaiting report but pt having sxs off and on, sometimes with extreme urgency, burning and hematuria. Told her to start the Cipro and I'll insure when culture report returns that it is effective.

## 2011-05-09 LAB — URINE CULTURE: Colony Count: 15000

## 2011-05-09 NOTE — Telephone Encounter (Signed)
Culture shows mild infection pansensitive. The Cipro will cover it.

## 2011-11-01 ENCOUNTER — Encounter: Payer: Self-pay | Admitting: Family Medicine

## 2011-11-01 ENCOUNTER — Ambulatory Visit (INDEPENDENT_AMBULATORY_CARE_PROVIDER_SITE_OTHER): Payer: Managed Care, Other (non HMO) | Admitting: Family Medicine

## 2011-11-01 ENCOUNTER — Ambulatory Visit: Payer: Managed Care, Other (non HMO) | Admitting: Family Medicine

## 2011-11-01 VITALS — BP 122/82 | HR 88 | Temp 98.7°F | Wt 147.4 lb

## 2011-11-01 DIAGNOSIS — Z8269 Family history of other diseases of the musculoskeletal system and connective tissue: Secondary | ICD-10-CM

## 2011-11-01 DIAGNOSIS — Z820 Family history of epilepsy and other diseases of the nervous system: Secondary | ICD-10-CM

## 2011-11-01 DIAGNOSIS — R0602 Shortness of breath: Secondary | ICD-10-CM

## 2011-11-01 LAB — D-DIMER, QUANTITATIVE: D-Dimer, Quant: 0.3 ug/mL-FEU (ref 0.00–0.48)

## 2011-11-01 LAB — CK TOTAL AND CKMB (NOT AT ARMC): CK, MB: 0.8 ng/mL (ref 0.3–4.0)

## 2011-11-01 NOTE — Progress Notes (Signed)
Addended by: Alvina Chou on: 11/01/2011 11:33 AM   Modules accepted: Orders

## 2011-11-01 NOTE — Assessment & Plan Note (Addendum)
With some dizziness.  Pt concerned about myasthenia - check acetylcholine receptor Ab. Check other blood work today (D dimer, etc). F/u with PCP if not improving.  ?anxiety contributing although pt denies increased stress recently. With change in exercise tolerance, check cardiac enzymes today although doubt cardiac cause.  EKG - NSR 70s, normal axis, intervals, no ST/T changes.

## 2011-11-01 NOTE — Progress Notes (Addendum)
  Subjective:    Patient ID: Abigail Kramer, female    DOB: 05/01/1966, 45 y.o.   MRN: [id number]  HPI CC: not feeling well  Has had SOB issues in past, last 2-3 wks worsening.  Currently feeling lightheaded and SOB.  Saturday night having pain between shoulder blades, some diaphoresis.  No longer.  Also having sleeping issues - several awakenings during night, also some trouble falling asleep.  Sleeping pill (diphenhydramine) does help.  Mid chest tightness.  Chest pain feels like tightness in mid chest, sometimes shocklike on left side.  Not exhertional.  Does endorse decreased exercise in last month (prior was walking several days/wk 3+mi, not recently 2/2 time constraints).  No HA, fevers/chills, coughing, wheezing, orthopnea, PNDyspnea, recent viral illness.  No palpitations, no skipped beats.  No diplopia  1 1/2 wks ago felt very lightheaded, felt nauseated.  Ate and drank food which improved sxs.  Does fly a lot.  Recently went to [facility].  1 1/2 hour max flight time.  No family or personal history of blood clots.  Not on hormonal meds.  Mother with heart rate issues, grandfather died of MI.  Father with myasthenia gravis.  Husband notices eye (thinks right) tends to be smaller when looking at her in pictures.  Review of Systems Per HPI    Objective:   Physical Exam  Nursing note and vitals reviewed. Constitutional: She appears well-developed and well-nourished. No distress.  HENT:  Head: Normocephalic and atraumatic.  Right Ear: Hearing, tympanic membrane, external ear and ear canal normal.  Left Ear: Hearing, tympanic membrane, external ear and ear canal normal.  Nose: No mucosal edema or rhinorrhea.  Mouth/Throat: Uvula is midline, oropharynx is clear and moist and mucous membranes are normal. No oropharyngeal exudate, posterior oropharyngeal edema, posterior oropharyngeal erythema or tonsillar abscesses.  Eyes: Conjunctivae and EOM are normal. Pupils are equal, round, and  reactive to light. No scleral icterus.       No fatigue with upward gaze No ptosis  Neck: Normal range of motion. Neck supple. No thyromegaly present.  Cardiovascular: Normal rate, regular rhythm, normal heart sounds and intact distal pulses.   No murmur heard. Pulmonary/Chest: Effort normal and breath sounds normal. No respiratory distress. She has no wheezes. She has no rales. She exhibits no mass and no tenderness.  Musculoskeletal: She exhibits no edema.       No palpable cords, no calf pain  Lymphadenopathy:    She has no cervical adenopathy.  Skin: Skin is warm and dry. No rash noted.  Psychiatric:       Somewhat anxious      Assessment & Plan:

## 2011-11-01 NOTE — Patient Instructions (Signed)
Please follow up with Dr. Milinda Antis in next several weeks if symptoms not improving. We have checked blood work today to evaluate this shortness of breath.  I have also checked blood work for myasthenia, although it does not sound like this is the case. EKG today looking normal. Please let us know if symptoms not improving or any worsening.

## 2011-11-01 NOTE — Progress Notes (Signed)
Addended by: Eustaquio Boyden on: 11/01/2011 11:04 AM   Modules accepted: Orders

## 2011-11-01 NOTE — Progress Notes (Signed)
Addended by: Alvina Chou on: 11/01/2011 11:24 AM   Modules accepted: Orders

## 2011-11-02 LAB — BASIC METABOLIC PANEL
BUN: 14 mg/dL (ref 6–23)
Chloride: 104 mEq/L (ref 96–112)
Potassium: 4.7 mEq/L (ref 3.5–5.1)

## 2011-11-02 LAB — CBC WITH DIFFERENTIAL/PLATELET
Basophils Absolute: 0.3 10*3/uL — ABNORMAL HIGH (ref 0.0–0.1)
Basophils Relative: 3.3 % — ABNORMAL HIGH (ref 0.0–3.0)
Eosinophils Absolute: 0.1 10*3/uL (ref 0.0–0.7)
Lymphocytes Relative: 32.7 % (ref 12.0–46.0)
MCHC: 33.8 g/dL (ref 30.0–36.0)
Neutrophils Relative %: 57.9 % (ref 43.0–77.0)
RBC: 4.56 Mil/uL (ref 3.87–5.11)

## 2011-11-02 LAB — TSH: TSH: 1.88 u[IU]/mL (ref 0.35–5.50)

## 2011-11-03 LAB — ACETYLCHOLINE RECEPTOR, BINDING: A CHR BINDING ABS: <0.3 nmol/L (ref <=0.30)

## 2011-11-08 ENCOUNTER — Ambulatory Visit (INDEPENDENT_AMBULATORY_CARE_PROVIDER_SITE_OTHER): Payer: Managed Care, Other (non HMO) | Admitting: Family Medicine

## 2011-11-08 ENCOUNTER — Ambulatory Visit (INDEPENDENT_AMBULATORY_CARE_PROVIDER_SITE_OTHER)
Admission: RE | Admit: 2011-11-08 | Discharge: 2011-11-08 | Disposition: A | Discharge status: Home / Self Care | Payer: Managed Care, Other (non HMO) | Source: Ambulatory Visit | Attending: Family Medicine | Admitting: Family Medicine

## 2011-11-08 ENCOUNTER — Encounter: Payer: Self-pay | Admitting: Family Medicine

## 2011-11-08 VITALS — BP 114/72 | HR 72 | Temp 98.5°F | Ht 67.0 in | Wt 148.2 lb

## 2011-11-08 DIAGNOSIS — R0602 Shortness of breath: Secondary | ICD-10-CM

## 2011-11-08 NOTE — Patient Instructions (Addendum)
Chest x ray today- will update you with result tomorrow  Get some prilosec otc 20 mg and take 1 pill daily in am  Update me in 1 week with how you are feeling - if not improved I may refer you to pulmonary  If worse - alert me  Heat is ok 10 minutes at a time

## 2011-11-08 NOTE — Assessment & Plan Note (Addendum)
With chest discomfort and neg EKG/ labs so far Rev note/ labs studies from last visit with Dr Reece Agar cxr today  Disc poss anx given symptoms/ (poss menopause)  Also disc poss gastritis or gerd Will try prilosec 1 week - inst to take 20 mg once daily in am  If no imp consider pulm ref for PFTs  If all nl - may need to consider anx as dx  Had a long disc about gradual dec in caffiene (tea)

## 2011-11-08 NOTE — Progress Notes (Signed)
Subjective:    Patient ID: Abigail Kramer, female    DOB: [birth year], 45 y.o.   MRN: [id number]  HPI Here for f/u of sob/ chest discomfort Saw Dr Reece Agar and had nl EKG with NSR in 70s as well as nl labs incl CK and D dimer and acetlcholine  receptor  Wt stable Nl vitals today  Still having chest discomfort on and off - mid chest and sometimes on L and right  Tight at times  no cough - but feels as if she has bronchitis  Feels like she cannot get a good deep breath  No wheezing   No new stress or anxiety  Sleep problem started about 3 weeks ago when this started  Just had vacation and good anniversary/ kids are good - etc  She is a general worrier  Not feeling anxious   Sleep problem- is new for her --occ cannot fall asleep occ wakes up early  Had partial hyst in past - ? Starting menopause  Night sweats have been pretty bad lately  Has not had a chest x ray   Patient Active Problem List  Diagnoses  . CONSTIPATION  . Hx: UTI (urinary tract infection)  . Routine general medical examination at a health care facility  . Shortness of breath   Past Medical History  Diagnosis Date  . Irregular menses   . Hx: UTI (urinary tract infection)   . Ovarian cyst 01/2009 and 04/2009  . Fibroid 01/2009    small   Past Surgical History  Procedure Date  . Tubal ligation   . Partial hysterectomy 07/2009    1 tube removed   History  Substance Use Topics  . Smoking status: Never Smoker   . Smokeless tobacco: Never Used  . Alcohol Use: No   Family History  Problem Relation Age of Onset  . Hypertension Mother   . Cancer Mother     breast  . Hypertension Brother   . Colon polyps Mother    No Known Allergies No current outpatient prescriptions on file prior to visit.      Review of Systems Review of Systems  Constitutional: Negative for fever, appetite change, fatigue and unexpected weight change.  Eyes: Negative for pain and visual disturbance.  Respiratory: Negative for cough  and wheeze or stridor, denies post nasal drip  Cardiovascular: Negative for cp or palpitations    Gastrointestinal: Negative for nausea, diarrhea and constipation.  Genitourinary: Negative for urgency and frequency.  Skin: Negative for pallor or rash   Neurological: Negative for weakness, light-headedness, numbness and headaches.  Hematological: Negative for adenopathy. Does not bruise/bleed easily.  Psychiatric/Behavioral: Negative for dysphoric mood. The patient is not nervous/anxious.  does have a tremor - and drinks a lot of caffeine         Objective:   Physical Exam  Constitutional: She appears well-developed and well-nourished. No distress.  HENT:  Head: Normocephalic and atraumatic.  Right Ear: External ear normal.  Left Ear: External ear normal.  Nose: Nose normal.  Mouth/Throat: Oropharynx is clear and moist.  Eyes: Conjunctivae and EOM are normal. Pupils are equal, round, and reactive to light. No scleral icterus.  Neck: Normal range of motion. Neck supple. No JVD present. Carotid bruit is not present. No thyromegaly present.  Cardiovascular: Normal rate, regular rhythm, normal heart sounds and intact distal pulses.  Exam reveals no gallop.   Pulmonary/Chest: Effort normal and breath sounds normal. No respiratory distress. She has no wheezes. She has no  rales. She exhibits no tenderness.  Abdominal: Soft. Bowel sounds are normal. She exhibits no distension, no abdominal bruit and no mass. There is no tenderness.  Musculoskeletal: Normal range of motion. She exhibits no edema and no tenderness.       No tenderness/ edema or palp cords   Lymphadenopathy:    She has no cervical adenopathy.  Neurological: She is alert. She has normal reflexes. She displays tremor. No cranial nerve deficit or sensory deficit. She exhibits normal muscle tone. Coordination and gait normal.  Skin: Skin is warm and dry. No rash noted. No erythema. No pallor.  Psychiatric: She has a normal mood and  affect.       Not seemingly anxious or depressed Does have a hand tremor  Good eye contact and comm skills          Assessment & Plan:

## 2011-11-10 ENCOUNTER — Telehealth: Payer: Self-pay | Admitting: Family Medicine

## 2011-11-10 NOTE — Telephone Encounter (Signed)
Requesting call back to discuss x-ray results

## 2011-11-24 ENCOUNTER — Telehealth: Payer: Self-pay | Admitting: Internal Medicine

## 2011-11-24 NOTE — Telephone Encounter (Signed)
Patient called stating she is still having SOB but the prilosec has helped but she still is not sleeping.  Please advise.

## 2011-11-24 NOTE — Telephone Encounter (Signed)
Ok - so prilosec is helping cp and sob but symptoms not gone yet --that is a good start  Give it another week and update me again  Try bendryl 25-50 mg at bedtime to see if this helps with sleep in the meantime also udpate me earlier if symptoms worsen

## 2011-11-24 NOTE — Telephone Encounter (Signed)
Patient notified as instructed by telephone. 

## 2012-05-02 ENCOUNTER — Encounter: Payer: Self-pay | Admitting: Family Medicine

## 2012-05-05 ENCOUNTER — Encounter: Payer: Self-pay | Admitting: Family Medicine

## 2012-05-08 ENCOUNTER — Encounter: Payer: Self-pay | Admitting: Family Medicine

## 2012-05-08 ENCOUNTER — Encounter: Payer: Self-pay | Admitting: *Deleted

## 2012-07-05 ENCOUNTER — Ambulatory Visit: Payer: Managed Care, Other (non HMO)

## 2012-10-13 ENCOUNTER — Encounter: Payer: Self-pay | Admitting: Family Medicine

## 2012-10-13 ENCOUNTER — Telehealth: Payer: Self-pay | Admitting: Family Medicine

## 2012-10-13 ENCOUNTER — Ambulatory Visit (INDEPENDENT_AMBULATORY_CARE_PROVIDER_SITE_OTHER): Payer: Managed Care, Other (non HMO) | Admitting: Family Medicine

## 2012-10-13 VITALS — BP 122/78 | HR 86 | Temp 98.2°F | Ht 67.0 in | Wt 138.2 lb

## 2012-10-13 DIAGNOSIS — IMO0002 Reserved for concepts with insufficient information to code with codable children: Secondary | ICD-10-CM

## 2012-10-13 DIAGNOSIS — H8110 Benign paroxysmal vertigo, unspecified ear: Secondary | ICD-10-CM

## 2012-10-13 MED ORDER — PROMETHAZINE HCL 25 MG/ML IJ SOLN
50.0000 mg | Freq: Once | INTRAMUSCULAR | Status: AC
  Administered 2012-10-13: 50 mg via INTRAMUSCULAR

## 2012-10-13 MED ORDER — MECLIZINE HCL 25 MG PO TABS: 25.0000 mg | ORAL_TABLET | Freq: Four times a day (QID) | ORAL | Status: DC | PRN

## 2012-10-13 NOTE — Patient Instructions (Addendum)
I think you have vertigo  Phenergan injection now Pick up px for meclizine to take up to every 4 hours (wait 4 hours to take the first dose) -sent to [facility] Stay still/ move head slowly (move slowly in general) If new symptoms let me know  Update if not starting to improve in a week or if worsening

## 2012-10-13 NOTE — Telephone Encounter (Signed)
She was seen in the office and treated

## 2012-10-13 NOTE — Telephone Encounter (Signed)
°  Caller: Dale/Spouse; Patient Name: Abigail Kramer; PCP: Roxy Manns [facility]); Best Callback Phone Number: [phone].  Pt calling that she is having dizziness started  throughout the night 10/12/12 and has vomited 5-6 x in the last hour.    As long as she lies still the room does not spin.  Afebrile.  Triaged Dizziness and Vertigo. Has taken no meds.  Needs to be in the next 4 hour for vertigo and vomiting and not responding to 4 hours of home care.  Appt made at 1145, today 10/13/12 with Tower.

## 2012-10-13 NOTE — Progress Notes (Signed)
Subjective:    Patient ID: Abigail Kramer, female    DOB: Aug 07, 1966, 46 y.o.   MRN: [id number]  HPI Her for dizziness/ vertigo  [place]  Symptoms started this am Rolled over in bed - got dizzy  Got better, then flipped head over after shower-- and got severely dizzy -- then vomited several times  Feels like the room is spinning  Is much improved when she stays still  Moving head to the L makes it much worse   Patient Active Problem List  Diagnosis  . CONSTIPATION  . Hx: UTI (urinary tract infection)  . Routine general medical examination at a health care facility  . Shortness of breath   Past Medical History  Diagnosis Date  . Irregular menses   . Hx: UTI (urinary tract infection)   . Ovarian cyst 01/2009 and 04/2009  . Fibroid 01/2009    small   Past Surgical History  Procedure Date  . Tubal ligation   . Partial hysterectomy 07/2009    1 tube removed   History  Substance Use Topics  . Smoking status: Never Smoker   . Smokeless tobacco: Never Used  . Alcohol Use: No   Family History  Problem Relation Age of Onset  . Hypertension Mother   . Cancer Mother     breast  . Hypertension Brother   . Colon polyps Mother    No Known Allergies Current Outpatient Prescriptions on File Prior to Visit  Medication Sig Dispense Refill  . diphenhydramine-acetaminophen (TYLENOL PM) 25-500 MG TABS Take 1 tablet by mouth at bedtime as needed.            Review of Systems Review of Systems  Constitutional: Negative for fever, appetite change, fatigue and unexpected weight change.  Eyes: Negative for pain and visual disturbance.  Respiratory: Negative for cough and shortness of breath.   Cardiovascular: Negative for cp or palpitations    Gastrointestinal: Negative for nausea, diarrhea and constipation.  Genitourinary: Negative for urgency and frequency.  Skin: Negative for pallor or rash   Neurological: Negative for weakness,  numbness and  headaches. neg for speech slurring or confusion, pos for vertigo (positional) Hematological: Negative for adenopathy. Does not bruise/bleed easily.  Psychiatric/Behavioral: Negative for dysphoric mood. The patient is not nervous/anxious.         Objective:   Physical Exam  Constitutional: She appears well-developed and well-nourished. No distress.  HENT:  Head: Normocephalic and atraumatic.  Mouth/Throat: Oropharynx is clear and moist.  Eyes: Conjunctivae normal and EOM are normal. Pupils are equal, round, and reactive to light. Right eye exhibits no discharge. Left eye exhibits no discharge.       3-4 beats of horizontal nystagmus bilat   Neck: Normal range of motion. Neck supple. No thyromegaly present.  Cardiovascular: Normal rate, regular rhythm and intact distal pulses.   Pulmonary/Chest: Effort normal and breath sounds normal. No respiratory distress. She has no wheezes.  Abdominal: Soft. Bowel sounds are normal. She exhibits no distension and no mass. There is no tenderness.  Musculoskeletal: She exhibits no edema.  Lymphadenopathy:    She has no cervical adenopathy.  Neurological: She is alert. She has normal reflexes. She displays no atrophy and no tremor. No cranial nerve deficit. She exhibits normal muscle tone. Coordination normal.       Slow assisted gait   Skin: Skin is warm and dry. No erythema. No pallor.       Nl skin  turgor  Psychiatric: She has a normal mood and affect.          Assessment & Plan:

## 2012-10-13 NOTE — Assessment & Plan Note (Signed)
With recent travel to [place] and barometric change Phenergan 50 IM now  Disc moving slowly  Given px for mecilzine As she improves may be able to do exercises  Will update if worse or new symptoms  Supportive husband present

## 2012-12-20 HISTORY — PX: PARTIAL HYSTERECTOMY: SHX80

## 2013-10-03 ENCOUNTER — Encounter: Payer: Self-pay | Admitting: Family Medicine

## 2013-10-04 ENCOUNTER — Encounter: Payer: Self-pay | Admitting: Family Medicine

## 2013-12-17 ENCOUNTER — Telehealth: Payer: Self-pay | Admitting: Family Medicine

## 2013-12-17 DIAGNOSIS — Z0000 Encounter for general adult medical examination without abnormal findings: Secondary | ICD-10-CM

## 2013-12-17 NOTE — Telephone Encounter (Signed)
Pt is very hormonal and would like to see if there is any other labs that could be drawn tomorrow (12/18/13) to discuss at [facility]?

## 2013-12-17 NOTE — Telephone Encounter (Signed)
Please add FSH and LH to labs - dx : menopause , thanks and we will discuss at her visit

## 2013-12-17 NOTE — Telephone Encounter (Signed)
Message copied by Judy Pimple on Mon Dec 17, 2013  8:08 AM ------      Message from: Alvina Chou      Created: Wed Dec 12, 2013 10:37 AM      Regarding: Lab orders for Tuesday, 12.30.14       Patient is scheduled for CPX labs, please order future labs, Thanks , Terri       ------

## 2013-12-18 ENCOUNTER — Other Ambulatory Visit (INDEPENDENT_AMBULATORY_CARE_PROVIDER_SITE_OTHER): Payer: Managed Care, Other (non HMO)

## 2013-12-18 DIAGNOSIS — Z780 Asymptomatic menopausal state: Secondary | ICD-10-CM

## 2013-12-18 DIAGNOSIS — Z0000 Encounter for general adult medical examination without abnormal findings: Secondary | ICD-10-CM

## 2013-12-18 DIAGNOSIS — N951 Menopausal and female climacteric states: Secondary | ICD-10-CM

## 2013-12-18 LAB — CBC WITH DIFFERENTIAL/PLATELET
Basophils Relative: 0.5 % (ref 0.0–3.0)
Eosinophils Relative: 1.6 % (ref 0.0–5.0)
Lymphocytes Relative: 36.7 % (ref 12.0–46.0)
MCV: 88.1 fl (ref 78.0–100.0)
Monocytes Absolute: 0.4 10*3/uL (ref 0.1–1.0)
Monocytes Relative: 5.4 % (ref 3.0–12.0)
Neutrophils Relative %: 55.8 % (ref 43.0–77.0)
RBC: 4.5 Mil/uL (ref 3.87–5.11)
WBC: 7.9 10*3/uL (ref 4.5–10.5)

## 2013-12-18 LAB — COMPREHENSIVE METABOLIC PANEL
Albumin: 4.1 g/dL (ref 3.5–5.2)
BUN: 9 mg/dL (ref 6–23)
CO2: 26 mEq/L (ref 19–32)
Calcium: 8.6 mg/dL (ref 8.4–10.5)
Chloride: 106 mEq/L (ref 96–112)
Glucose, Bld: 88 mg/dL (ref 70–99)
Potassium: 4.2 mEq/L (ref 3.5–5.1)

## 2013-12-18 LAB — LIPID PANEL
Cholesterol: 225 mg/dL — ABNORMAL HIGH (ref 0–200)
HDL: 103.3 mg/dL (ref >39.00)
Total CHOL/HDL Ratio: 2
Triglycerides: 120 mg/dL (ref 0.0–149.0)
VLDL: 24 mg/dL (ref 0.0–40.0)

## 2013-12-18 LAB — TSH: TSH: 0.86 u[IU]/mL (ref 0.35–5.50)

## 2013-12-18 NOTE — Telephone Encounter (Signed)
done

## 2013-12-18 NOTE — Addendum Note (Signed)
Addended by: Baldomero Lamy on: 12/18/2013 09:08 AM   Modules accepted: Orders

## 2013-12-25 ENCOUNTER — Ambulatory Visit (INDEPENDENT_AMBULATORY_CARE_PROVIDER_SITE_OTHER): Payer: Managed Care, Other (non HMO) | Admitting: Family Medicine

## 2013-12-25 ENCOUNTER — Encounter: Payer: Self-pay | Admitting: Family Medicine

## 2013-12-25 VITALS — BP 112/76 | HR 82 | Temp 98.5°F | Ht 66.75 in | Wt 150.5 lb

## 2013-12-25 DIAGNOSIS — N951 Menopausal and female climacteric states: Secondary | ICD-10-CM

## 2013-12-25 DIAGNOSIS — Z0000 Encounter for general adult medical examination without abnormal findings: Secondary | ICD-10-CM

## 2013-12-25 MED ORDER — FLUOXETINE HCL 20 MG PO TABS: 20.0000 mg | ORAL_TABLET | Freq: Every day | ORAL | Status: DC

## 2013-12-25 NOTE — Progress Notes (Signed)
Subjective:    Patient ID: Abigail Kramer, female    DOB: 1966-09-07, 48 y.o.   MRN: [id number]  HPI Here for health maintenance exam and to review chronic medical problems    Has been doing ok overall   Some mood changes lately  ? Hormone change  A lot of life changes - promotion and daughter moved in  No time to take care of herself (doing the job of 3 people and traveling a lot)   Has eaten more junk-- starting back on wt watchers now   [employer] is up 12 lb with bmi of 23  Pap 09 Had a hysterectomy - so ? If menopausal -- no cancer of any type  Is having night sweats - and stays hot  Also less focus  She would like to try med for mood  No gyn symptoms at all    Flu vaccine 9/14  Td 12/07  Mammogram 10/14- neg f/u - told to return to annual screening    Labs:   Chemistry      Component Value Date/Time   NA 137 12/18/2013 0758   K 4.2 12/18/2013 0758   CL 106 12/18/2013 0758   CO2 26 12/18/2013 0758   BUN 9 12/18/2013 0758   CREATININE 0.7 12/18/2013 0758      Component Value Date/Time   CALCIUM 8.6 12/18/2013 0758   ALKPHOS 45 12/18/2013 0758   AST 17 12/18/2013 0758   ALT 20 12/18/2013 0758   BILITOT 0.7 12/18/2013 0758     glucose is 88 Lab Results  Component Value Date   WBC 7.9 12/18/2013   HGB 13.2 12/18/2013   HCT 39.6 12/18/2013   MCV 88.1 12/18/2013   PLT 276.0 12/18/2013    Lab Results  Component Value Date   TSH 0.86 12/18/2013     Lipids: Lab Results  Component Value Date   CHOL 225* 12/18/2013   CHOL 187 04/13/2011   CHOL 206* 01/17/2009   Lab Results  Component Value Date   HDL 103.30 12/18/2013   HDL 88.90 04/13/2011   HDL 98.1 01/17/2009   Lab Results  Component Value Date   LDLCALC 81 04/13/2011   Lab Results  Component Value Date   TRIG 120.0 12/18/2013   TRIG 84.0 04/13/2011   TRIG 56 01/17/2009   Lab Results  Component Value Date   CHOLHDL 2 12/18/2013   CHOLHDL 2 04/13/2011   CHOLHDL 2.1 CALC 01/17/2009   Lab  Results  Component Value Date   LDLDIRECT 99.3 12/18/2013   LDLDIRECT 90.3 01/17/2009   ratio is still very good   Patient Active Problem List   Diagnosis Date Noted  . Positional vertigo 10/13/2012  . Routine general medical examination at a health care facility 04/08/2011  . Hx: UTI (urinary tract infection)   . CONSTIPATION 02/12/2010   Past Medical History  Diagnosis Date  . Irregular menses   . Hx: UTI (urinary tract infection)   . Ovarian cyst 01/2009 and 04/2009  . Fibroid 01/2009    small   Past Surgical History  Procedure Laterality Date  . Tubal ligation    . Partial hysterectomy  07/2009    1 tube removed   History  Substance Use Topics  . Smoking status: Never Smoker   . Smokeless tobacco: Never Used  . Alcohol Use: No   Family History  Problem Relation Age of Onset  . Hypertension Mother   . Cancer Mother     breast  .  Hypertension Brother   . Colon polyps Mother    No Known Allergies Current Outpatient Prescriptions on File Prior to Visit  Medication Sig Dispense Refill  . diphenhydramine-acetaminophen (TYLENOL PM) 25-500 MG TABS Take 1 tablet by mouth at bedtime as needed.        . meclizine (ANTIVERT) 25 MG tablet Take 1 tablet (25 mg total) by mouth every 6 (six) hours as needed.  30 tablet  1   No current facility-administered medications on file prior to visit.      Review of Systems Review of Systems  Constitutional: Negative for fever, appetite change, fatigue and unexpected weight change.  Eyes: Negative for pain and visual disturbance.  Respiratory: Negative for cough and shortness of breath.   Cardiovascular: Negative for cp or palpitations    Gastrointestinal: Negative for nausea, diarrhea and constipation.  Genitourinary: Negative for urgency and frequency.  Skin: Negative for pallor or rash   Neurological: Negative for weakness, light-headedness, numbness and headaches.  Hematological: Negative for adenopathy. Does not bruise/bleed  easily.  Psychiatric/Behavioral: pos for labile mood/ neg for SI         Objective:   Physical Exam  Constitutional: She appears well-developed and well-nourished. No distress.  HENT:  Head: Normocephalic and atraumatic.  Right Ear: External ear normal.  Left Ear: External ear normal.  Mouth/Throat: Oropharynx is clear and moist.  Eyes: Conjunctivae and EOM are normal. Pupils are equal, round, and reactive to light. No scleral icterus.  Neck: Normal range of motion. Neck supple. No JVD present. Carotid bruit is not present. No thyromegaly present.  Cardiovascular: Normal rate, regular rhythm, normal heart sounds and intact distal pulses.  Exam reveals no gallop.   Pulmonary/Chest: Effort normal and breath sounds normal. No respiratory distress. She has no wheezes. She exhibits no tenderness.  Abdominal: Soft. Bowel sounds are normal. She exhibits no distension, no abdominal bruit and no mass. There is no tenderness.  Genitourinary: No breast swelling, tenderness, discharge or bleeding.  Breast exam: No mass, nodules, thickening, tenderness, bulging, retraction, inflamation, nipple discharge or skin changes noted.  No axillary or clavicular LA.    Musculoskeletal: Normal range of motion. She exhibits no edema and no tenderness.  Lymphadenopathy:    She has no cervical adenopathy.  Neurological: She is alert. She has normal reflexes. No cranial nerve deficit. She exhibits normal muscle tone. Coordination normal.  Skin: Skin is warm and dry. No rash noted. No erythema. No pallor.  Some lentigos   Psychiatric: She has a normal mood and affect.          Assessment & Plan:

## 2013-12-25 NOTE — Patient Instructions (Signed)
Take 1/2 tablet of the prozac once daily and if you tolerate it well and want to increase it - go up to one pill daily If worse or more depressed or anxious stop it and let me know  Eat healthy and try to exercise Labs look good

## 2013-12-25 NOTE — Progress Notes (Signed)
Pre-visit discussion using our clinic review tool. No additional management support is needed unless otherwise documented below in the visit note.  

## 2013-12-26 NOTE — Assessment & Plan Note (Signed)
With mood changes  Does not want HRT Will try prozac 10 mg -titrate to 20  Disc realistically what to expect in terms of imp  Disc poss side eff in detail -incl worse mood or SI- inst to call and stop med if needed

## 2013-12-26 NOTE — Assessment & Plan Note (Signed)
Reviewed health habits including diet and exercise and skin cancer prevention Reviewed appropriate screening tests for age  Also reviewed health mt list, fam hx and immunization status , as well as social and family history   See HPI Labs reviewed  

## 2014-02-28 ENCOUNTER — Telehealth: Payer: Self-pay

## 2014-02-28 NOTE — Telephone Encounter (Signed)
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 Pt has had diarrhea for 2-3 weeks; is slightly better since taking probiotic but pt is not sure if prozac could be causing diarrhea. [facility]. Pt request cb.

## 2014-02-28 NOTE — Telephone Encounter (Signed)
Pt left v/m; pt started on prozac 12/25/13 for menopausal symptoms; pt request cb to discuss other options. Left v/m for pt to cb.

## 2014-02-28 NOTE — Telephone Encounter (Signed)
wellbutrin treats depression and not menopausal disorder- does she think she is depressed? Does she think the prozac made her feel depressed?

## 2014-03-01 MED ORDER — BUPROPION HCL ER (XL) 150 MG PO TB24: 150.0000 mg | ORAL_TABLET | Freq: Every day | ORAL | Status: DC

## 2014-03-01 NOTE — Telephone Encounter (Signed)
Left voicemail requesting pt to call office 

## 2014-03-01 NOTE — Telephone Encounter (Signed)
We can certainly try it  In that case -stop the prozac and start wellbutrin xl- [facility]  wellbutrin is stimulating and can worsen anxiety- if this happens or other side effects please let me know  Follow up with me in about a month

## 2014-03-01 NOTE — Telephone Encounter (Signed)
Pt was just reading some articles that said wellbutrin would help menopausal sxs. Pt said she is having hot flashes, "brain fog", and moody. Pt also thinks that the prozac isn't making her feel depressed but it's causing her not to have any motivation. Pt can barely do her work at her job and when she gets home it's really hard for her to do anything, she has no energy, and also no sex drive, pt was trying to see if the wellbutrin would help with her menopausal sxs as well as help with the prozac that's not really giving her any energy/motivation, also not sure if the prozac is causing diarrhea

## 2014-03-01 NOTE — Telephone Encounter (Signed)
Pt notified of Dr. Marliss Coots comments/recommendations, pt advise that Rx sent to pharmacy and f/u scheduled for 04/10/14

## 2014-04-10 ENCOUNTER — Ambulatory Visit (INDEPENDENT_AMBULATORY_CARE_PROVIDER_SITE_OTHER): Payer: Managed Care, Other (non HMO) | Admitting: Family Medicine

## 2014-04-10 ENCOUNTER — Encounter: Payer: Self-pay | Admitting: Family Medicine

## 2014-04-10 VITALS — BP 126/86 | HR 85 | Temp 98.5°F | Ht 66.75 in | Wt 142.0 lb

## 2014-04-10 DIAGNOSIS — F39 Unspecified mood [affective] disorder: Secondary | ICD-10-CM

## 2014-04-10 DIAGNOSIS — R4586 Emotional lability: Secondary | ICD-10-CM

## 2014-04-10 DIAGNOSIS — N951 Menopausal and female climacteric states: Secondary | ICD-10-CM

## 2014-04-10 MED ORDER — BUPROPION HCL ER (XL) 150 MG PO TB24: 150.0000 mg | ORAL_TABLET | Freq: Every day | ORAL | Status: DC

## 2014-04-10 NOTE — Patient Instructions (Addendum)
Continue wellbutrin for mood disorder associated with menopause  You can try black kohash over the counter for hot flashes as directed Keep me posted    Take care of yourself

## 2014-04-10 NOTE — Progress Notes (Signed)
Subjective:    Patient ID: Abigail Kramer, female    DOB: 31-Mar-1966, 48 y.o.   MRN: [id number]  HPI Here for f/u for mood / menopause  She started on prozac for symptoms and -noticed sluggishness and lack of motivation No change in night sweats  She also had diarrhea - and was miserable with it   Changed to wellbutrin  Did really well for a while - mood and energy level are better  Night sweats are worse with time   She has also noticed cold hands and feet  They turn color  Had these symptoms before  Also joints hurts - and her daughter has lupus   Patient Active Problem List   Diagnosis Date Noted  . Symptoms, such as flushing, sleeplessness, headache, lack of concentration, associated with the menopause 12/25/2013  . Positional vertigo 10/13/2012  . Routine general medical examination at a health care facility 04/08/2011  . Hx: UTI (urinary tract infection)   . CONSTIPATION 02/12/2010   Past Medical History  Diagnosis Date  . Irregular menses   . Hx: UTI (urinary tract infection)   . Ovarian cyst 01/2009 and 04/2009  . Fibroid 01/2009    small   Past Surgical History  Procedure Laterality Date  . Tubal ligation    . Partial hysterectomy  07/2009    1 tube removed   History  Substance Use Topics  . Smoking status: Never Smoker   . Smokeless tobacco: Never Used  . Alcohol Use: No   Family History  Problem Relation Age of Onset  . Hypertension Mother   . Cancer Mother     breast  . Hypertension Brother   . Colon polyps Mother    No Known Allergies Current Outpatient Prescriptions on File Prior to Visit  Medication Sig Dispense Refill  . buPROPion (WELLBUTRIN XL) 150 MG 24 hr tablet Take 1 tablet (150 mg total) by mouth daily.  30 tablet  3  . diphenhydramine-acetaminophen (TYLENOL PM) 25-500 MG TABS Take 1 tablet by mouth at bedtime as needed.        . meclizine (ANTIVERT) 25 MG tablet Take 1 tablet (25 mg total) by mouth every 6 (six) hours as needed.  30  tablet  1   No current facility-administered medications on file prior to visit.     Review of Systems Review of Systems  Constitutional: Negative for fever, appetite change,  and unexpected weight change.  Eyes: Negative for pain and visual disturbance.  Respiratory: Negative for cough and shortness of breath.   Cardiovascular: Negative for cp or palpitations    Gastrointestinal: Negative for nausea, diarrhea and constipation.  Genitourinary: Negative for urgency and frequency. pos for hot flashes and night sweats  Skin: Negative for pallor or rash   Neurological: Negative for weakness, light-headedness, numbness and headaches.  Hematological: Negative for adenopathy. Does not bruise/bleed easily.  Psychiatric/Behavioral: Negative for dysphoric mood. The patient is not nervous/anxious.         Objective:   Physical Exam  Constitutional: She appears well-developed and well-nourished.  HENT:  Head: Normocephalic and atraumatic.  Eyes: Conjunctivae and EOM are normal. Pupils are equal, round, and reactive to light.  Neck: Normal range of motion. Neck supple. Carotid bruit is not present. No tracheal deviation present. No thyromegaly present.  Cardiovascular: Normal rate and regular rhythm.   Pulmonary/Chest: Effort normal and breath sounds normal.  Musculoskeletal: She exhibits no edema.  Lymphadenopathy:    She has no cervical adenopathy.  Neurological: She is alert. She has normal reflexes. No cranial nerve deficit. She exhibits normal muscle tone. Coordination normal.  No tremor   Skin: Skin is warm and dry. No rash noted. No erythema. No pallor.  Psychiatric: She has a normal mood and affect.          Assessment & Plan:

## 2014-04-10 NOTE — Progress Notes (Signed)
Pre visit review using our clinic review tool, if applicable. No additional management support is needed unless otherwise documented below in the visit note. 

## 2014-04-11 NOTE — Assessment & Plan Note (Signed)
Still has hot flashes / maybe worse with wellbutrin -but since it helps her mood she wants to stay on it  Disc trial of black kohash for menopausal symptoms  Will continue to follow

## 2014-04-11 NOTE — Assessment & Plan Note (Signed)
Suspect due to menopause Overall improved and better energy level with wellbutrin - will continue it  Did not tolerate prozac

## 2014-08-30 ENCOUNTER — Encounter: Payer: Self-pay | Admitting: Internal Medicine

## 2014-10-18 ENCOUNTER — Ambulatory Visit (INDEPENDENT_AMBULATORY_CARE_PROVIDER_SITE_OTHER): Payer: Managed Care, Other (non HMO) | Admitting: Family Medicine

## 2014-10-18 ENCOUNTER — Encounter: Payer: Self-pay | Admitting: Family Medicine

## 2014-10-18 VITALS — BP 118/74 | HR 78 | Temp 99.3°F | Ht 66.75 in | Wt 149.5 lb

## 2014-10-18 DIAGNOSIS — N951 Menopausal and female climacteric states: Secondary | ICD-10-CM

## 2014-10-18 DIAGNOSIS — K130 Diseases of lips: Secondary | ICD-10-CM

## 2014-10-18 DIAGNOSIS — F39 Unspecified mood [affective] disorder: Secondary | ICD-10-CM

## 2014-10-18 DIAGNOSIS — R5382 Chronic fatigue, unspecified: Secondary | ICD-10-CM

## 2014-10-18 DIAGNOSIS — R5383 Other fatigue: Secondary | ICD-10-CM | POA: Insufficient documentation

## 2014-10-18 DIAGNOSIS — B009 Herpesviral infection, unspecified: Secondary | ICD-10-CM | POA: Insufficient documentation

## 2014-10-18 DIAGNOSIS — R4586 Emotional lability: Secondary | ICD-10-CM

## 2014-10-18 DIAGNOSIS — K121 Other forms of stomatitis: Secondary | ICD-10-CM

## 2014-10-18 DIAGNOSIS — I7300 Raynaud's syndrome without gangrene: Secondary | ICD-10-CM

## 2014-10-18 DIAGNOSIS — H538 Other visual disturbances: Secondary | ICD-10-CM

## 2014-10-18 LAB — LUTEINIZING HORMONE: LH: 5.8 m[IU]/mL

## 2014-10-18 LAB — TSH: TSH: 1.45 u[IU]/mL (ref 0.35–4.50)

## 2014-10-18 LAB — FOLLICLE STIMULATING HORMONE: FSH: 11.8 m[IU]/mL

## 2014-10-18 MED ORDER — KETOCONAZOLE 2 % EX CREA: 1.0000 "application " | TOPICAL_CREAM | Freq: Two times a day (BID) | CUTANEOUS | Status: DC

## 2014-10-18 NOTE — Patient Instructions (Signed)
Labs today-thyroid and hormone  Don't forget to get an eye appt. Try the ketoconazole on corners of mouth  Try the kenalog in orabase gel for mouth ulcers as needed  Keep hands and feet warm   If you feel the need to add another antidepressant for mood swings - let me know

## 2014-10-18 NOTE — Progress Notes (Signed)
Pre visit review using our clinic review tool, if applicable. No additional management support is needed unless otherwise documented below in the visit note. 

## 2014-10-18 NOTE — Progress Notes (Signed)
Subjective:    Patient ID: Abigail Kramer, female    DOB: [birth year], 48 y.o.   MRN: [id number]  HPI Having a lot of menopausal symptoms    Dry hair/ loosing hair  No focus  Very forgetful  Night sweats-  No sex drive Tired and moody and jumpy  Thinks her fingers are more wrinkly   Eye issues- last eye exam in December - scratchy eyes and worse vision  Sensitive to smells   Had a hysterectomy - ? When she would stop her period    Problems with mouth sores/ lip splitting  Corners of mouth- 2 mo  Ulcers on palate occasionally   She is getting by with the mood changes  wellbutrin is great - she thinks that helps  [place] Fleeting is helping too  Does not want to go on HRT   Patient Active Problem List   Diagnosis Date Noted  . Mood changes 04/10/2014  . Symptoms, such as flushing, sleeplessness, headache, lack of concentration, associated with the menopause 12/25/2013  . Routine general medical examination at a health care facility 04/08/2011   Past Medical History  Diagnosis Date  . Irregular menses   . Hx: UTI (urinary tract infection)   . Ovarian cyst 01/2009 and 04/2009  . Fibroid 01/2009    small   Past Surgical History  Procedure Laterality Date  . Tubal ligation    . Partial hysterectomy  07/2009    1 tube removed   History  Substance Use Topics  . Smoking status: Never Smoker   . Smokeless tobacco: Never Used  . Alcohol Use: No   Family History  Problem Relation Age of Onset  . Hypertension Mother   . Cancer Mother     breast  . Hypertension Brother   . Colon polyps Mother    Allergies  Allergen Reactions  . Prozac [Fluoxetine Hcl]     Sluggishness/dismotivation   Current Outpatient Prescriptions on File Prior to Visit  Medication Sig Dispense Refill  . buPROPion (WELLBUTRIN XL) 150 MG 24 hr tablet Take 1 tablet (150 mg total) by mouth daily.  30 tablet  11  . diphenhydramine-acetaminophen (TYLENOL PM) 25-500 MG TABS Take 1 tablet by mouth at  bedtime as needed.        . meclizine (ANTIVERT) 25 MG tablet Take 1 tablet (25 mg total) by mouth every 6 (six) hours as needed.  30 tablet  1   No current facility-administered medications on file prior to visit.     Review of Systems Review of Systems  Constitutional: Negative for fever, appetite change,  and unexpected weight change. pos for fatigue Eyes: Negative for pain and visual disturbance. ENT neg for uri symptoms   Respiratory: Negative for cough and shortness of breath.   Cardiovascular: Negative for cp or palpitations    Gastrointestinal: Negative for nausea, diarrhea and constipation.  Genitourinary: Negative for urgency and frequency.  Skin: Negative for pallor or rash  pos for dryness at corners of lips  Neurological: Negative for weakness, light-headedness, numbness and headaches.  Hematological: Negative for adenopathy. Does not bruise/bleed easily.  Psychiatric/Behavioral: Negative for dysphoric mood. The patient is not nervous/anxious.         Objective:   Physical Exam  Constitutional: She appears well-developed and well-nourished. No distress.  HENT:  Head: Normocephalic and atraumatic.  Right Ear: External ear normal.  Left Ear: External ear normal.  Mouth/Throat: Oropharynx is clear and moist. No oropharyngeal exudate.  apthous ulcer -  small with erythema R upper palate  Some cracking at corners of mouth bilat   Eyes: Conjunctivae and EOM are normal. Pupils are equal, round, and reactive to light. Right eye exhibits no discharge. Left eye exhibits no discharge. No scleral icterus.  Neck: Normal range of motion. Neck supple.  Cardiovascular: Normal rate, regular rhythm, normal heart sounds and intact distal pulses.   Pulmonary/Chest: Effort normal and breath sounds normal. No respiratory distress. She has no wheezes. She has no rales.  Musculoskeletal: She exhibits no edema.  Lymphadenopathy:    She has no cervical adenopathy.  Neurological: She is  alert. She has normal reflexes. No cranial nerve deficit. She exhibits normal muscle tone. Coordination normal.  Skin: Skin is warm and dry. No pallor.  No allopecia noted   Some cracking at corners of her mouth  Psychiatric: She has a normal mood and affect.          Assessment & Plan:   Problem List Items Addressed This Visit      Unprioritized   Cheilosis    Will try ketoconazole for poss fungal infection  Reminded not to lick lips Update if not starting to improve in a week or if worsening      Fatigue - Primary    Lab today ? If mood or menopause related     Relevant Orders      TSH (Completed)   Mood changes    Pt is tolerating some mood changes- suspect from menopause  Does not want to change medicines or add at this time If worse-consider poss addition of paxil or other SSRI    Mouth ulcer    Disc mouth ulcers Adv to avoid acidic food/bev Given px for kenalog in orabase gel to try    Raynaud phenomenon    Pt will make effort to keep ext warm  Nl exam Ca channel blocker is opt if worse in future     Symptoms, such as flushing, sleeplessness, headache, lack of concentration, associated with the menopause    Pt tolerating symptoms  fsh /lh today to see where she is in the menopause spectrum -given partial hyst in the past     Relevant Orders      Follicle Stimulating Hormone (Completed)      Luteinizing hormone (Completed)   Vision blurred    Enc her to get eye exam if this continues

## 2014-10-20 NOTE — Assessment & Plan Note (Signed)
Pt tolerating symptoms  fsh /lh today to see where she is in the menopause spectrum -given partial hyst in the past

## 2014-10-20 NOTE — Assessment & Plan Note (Signed)
Pt will make effort to keep ext warm  Nl exam Ca channel blocker is opt if worse in future

## 2014-10-20 NOTE — Assessment & Plan Note (Signed)
Disc mouth ulcers Adv to avoid acidic food/bev Given px for kenalog in orabase gel to try

## 2014-10-20 NOTE — Assessment & Plan Note (Signed)
Enc her to get eye exam if this continues

## 2014-10-20 NOTE — Assessment & Plan Note (Signed)
Will try ketoconazole for poss fungal infection  Reminded not to lick lips Update if not starting to improve in a week or if worsening

## 2014-10-20 NOTE — Assessment & Plan Note (Signed)
Pt is tolerating some mood changes- suspect from menopause  Does not want to change medicines or add at this time If worse-consider poss addition of paxil or other SSRI

## 2014-10-20 NOTE — Assessment & Plan Note (Signed)
Lab today ? If mood or menopause related

## 2014-10-23 ENCOUNTER — Encounter: Payer: Self-pay | Admitting: Family Medicine

## 2014-12-24 ENCOUNTER — Telehealth: Payer: Self-pay | Admitting: Family Medicine

## 2014-12-24 DIAGNOSIS — Z0000 Encounter for general adult medical examination without abnormal findings: Secondary | ICD-10-CM

## 2014-12-24 NOTE — Telephone Encounter (Signed)
-----   Message from Ellamae Sia sent at 12/17/2014  2:09 PM EST ----- Regarding: Lab orders for Wednesday, 1.6.15 Patient is scheduled for CPX labs, please order future labs, Thanks , Karna Christmas

## 2014-12-25 ENCOUNTER — Other Ambulatory Visit (INDEPENDENT_AMBULATORY_CARE_PROVIDER_SITE_OTHER): Payer: Managed Care, Other (non HMO)

## 2014-12-25 DIAGNOSIS — Z0000 Encounter for general adult medical examination without abnormal findings: Secondary | ICD-10-CM

## 2014-12-25 LAB — CBC WITH DIFFERENTIAL/PLATELET
Basophils Absolute: 0 10*3/uL (ref 0.0–0.1)
Basophils Relative: 0.7 % (ref 0.0–3.0)
Eosinophils Absolute: 0.1 10*3/uL (ref 0.0–0.7)
Eosinophils Relative: 1.7 % (ref 0.0–5.0)
HEMATOCRIT: 39.7 % (ref 36.0–46.0)
Hemoglobin: 13 g/dL (ref 12.0–15.0)
LYMPHS PCT: 37.7 % (ref 12.0–46.0)
Lymphs Abs: 2.1 10*3/uL (ref 0.7–4.0)
MCHC: 32.8 g/dL (ref 30.0–36.0)
MCV: 90.3 fl (ref 78.0–100.0)
MONO ABS: 0.4 10*3/uL (ref 0.1–1.0)
Monocytes Relative: 7.2 % (ref 3.0–12.0)
Neutro Abs: 2.9 10*3/uL (ref 1.4–7.7)
Neutrophils Relative %: 52.7 % (ref 43.0–77.0)
Platelets: 258 10*3/uL (ref 150.0–400.0)
RBC: 4.39 Mil/uL (ref 3.87–5.11)
RDW: 13.7 % (ref 11.5–15.5)
WBC: 5.6 10*3/uL (ref 4.0–10.5)

## 2014-12-25 LAB — COMPREHENSIVE METABOLIC PANEL
ALBUMIN: 4 g/dL (ref 3.5–5.2)
ALK PHOS: 54 U/L (ref 39–117)
ALT: 16 U/L (ref 0–35)
AST: 20 U/L (ref 0–37)
BUN: 11 mg/dL (ref 6–23)
CALCIUM: 8.6 mg/dL (ref 8.4–10.5)
CO2: 26 meq/L (ref 19–32)
Chloride: 106 mEq/L (ref 96–112)
Creatinine, Ser: 0.7 mg/dL (ref 0.4–1.2)
GFR: 92.87 mL/min (ref >60.00)
Glucose, Bld: 91 mg/dL (ref 70–99)
POTASSIUM: 4.2 meq/L (ref 3.5–5.1)
Sodium: 137 mEq/L (ref 135–145)
TOTAL PROTEIN: 6.8 g/dL (ref 6.0–8.3)
Total Bilirubin: 0.7 mg/dL (ref 0.2–1.2)

## 2014-12-25 LAB — TSH: TSH: 1.67 u[IU]/mL (ref 0.35–4.50)

## 2014-12-25 LAB — LIPID PANEL
CHOLESTEROL: 226 mg/dL — AB (ref 0–200)
HDL: 103.1 mg/dL (ref >39.00)
LDL CALC: 110 mg/dL — AB (ref 0–99)
NonHDL: 122.9
TRIGLYCERIDES: 64 mg/dL (ref 0.0–149.0)
Total CHOL/HDL Ratio: 2
VLDL: 12.8 mg/dL (ref 0.0–40.0)

## 2015-01-01 ENCOUNTER — Encounter: Payer: Self-pay | Admitting: Family Medicine

## 2015-01-01 ENCOUNTER — Ambulatory Visit (INDEPENDENT_AMBULATORY_CARE_PROVIDER_SITE_OTHER): Payer: Managed Care, Other (non HMO) | Admitting: Family Medicine

## 2015-01-01 VITALS — BP 116/74 | HR 71 | Temp 98.3°F | Ht 67.0 in | Wt 153.2 lb

## 2015-01-01 DIAGNOSIS — B009 Herpesviral infection, unspecified: Secondary | ICD-10-CM

## 2015-01-01 DIAGNOSIS — Z0000 Encounter for general adult medical examination without abnormal findings: Secondary | ICD-10-CM

## 2015-01-01 MED ORDER — BUPROPION HCL ER (XL) 150 MG PO TB24: 150.0000 mg | ORAL_TABLET | Freq: Every day | ORAL | Status: DC

## 2015-01-01 NOTE — Assessment & Plan Note (Signed)
Reviewed health habits including diet and exercise and skin cancer prevention Reviewed appropriate screening tests for age  Also reviewed health mt list, fam hx and immunization status , as well as social and family history   She will continue wt watchers and start walking  Labs rev  Good cholesterol/ high HDL -commended

## 2015-01-01 NOTE — Progress Notes (Signed)
Pre visit review using our clinic review tool, if applicable. No additional management support is needed unless otherwise documented below in the visit note. 

## 2015-01-01 NOTE — Progress Notes (Signed)
Subjective:    Patient ID: Abigail Kramer, female    DOB: Oct 16, 1966, 49 y.o.   MRN: [id number]  HPI Here for health maintenance exam and to review chronic medical problems    Wt is stable with bmi 24  Does not exercise - was going to start exercise (walking at lunch)  Does eat pretty healthy   Declines HIV screening   Flu hot in 10/15 Td 12/07  Mammogram 11/15 - was normal  No lumps  Had a hysterectomy - for fibroids /no cancer - not seeing gyn  No gyn symptoms or problems   colonosc 9/15- was normal  10 year recall on that   Has new hx of oral HSV Dentist is treating currently with anti virals  Will disc re: would have to go on suppressive tx  Unsure how long she has had this  Is controlled now     Results for orders placed or performed in visit on 12/25/14  CBC with Differential  Result Value Ref Range   WBC 5.6 4.0 - 10.5 K/uL   RBC 4.39 3.87 - 5.11 Mil/uL   Hemoglobin 13.0 12.0 - 15.0 g/dL   HCT 39.7 36.0 - 46.0 %   MCV 90.3 78.0 - 100.0 fl   MCHC 32.8 30.0 - 36.0 g/dL   RDW 13.7 11.5 - 15.5 %   Platelets 258.0 150.0 - 400.0 K/uL   Neutrophils Relative % 52.7 43.0 - 77.0 %   Lymphocytes Relative 37.7 12.0 - 46.0 %   Monocytes Relative 7.2 3.0 - 12.0 %   Eosinophils Relative 1.7 0.0 - 5.0 %   Basophils Relative 0.7 0.0 - 3.0 %   Neutro Abs 2.9 1.4 - 7.7 K/uL   Lymphs Abs 2.1 0.7 - 4.0 K/uL   Monocytes Absolute 0.4 0.1 - 1.0 K/uL   Eosinophils Absolute 0.1 0.0 - 0.7 K/uL   Basophils Absolute 0.0 0.0 - 0.1 K/uL  Comprehensive metabolic panel  Result Value Ref Range   Sodium 137 135 - 145 mEq/L   Potassium 4.2 3.5 - 5.1 mEq/L   Chloride 106 96 - 112 mEq/L   CO2 26 19 - 32 mEq/L   Glucose, Bld 91 70 - 99 mg/dL   BUN 11 6 - 23 mg/dL   Creatinine, Ser 0.7 0.4 - 1.2 mg/dL   Total Bilirubin 0.7 0.2 - 1.2 mg/dL   Alkaline Phosphatase 54 39 - 117 U/L   AST 20 0 - 37 U/L   ALT 16 0 - 35 U/L   Total Protein 6.8 6.0 - 8.3 g/dL   Albumin 4.0 3.5 - 5.2 g/dL     Calcium 8.6 8.4 - 10.5 mg/dL   GFR 92.87 >60.00 mL/min  Lipid panel  Result Value Ref Range   Cholesterol 226 (H) 0 - 200 mg/dL   Triglycerides 64.0 0.0 - 149.0 mg/dL   HDL 103.10 >39.00 mg/dL   VLDL 12.8 0.0 - 40.0 mg/dL   LDL Cholesterol 110 (H) 0 - 99 mg/dL   Total CHOL/HDL Ratio 2    NonHDL 122.90   TSH  Result Value Ref Range   TSH 1.67 0.35 - 4.50 uIU/mL    Cholesterol is excellent - high HDL , great profile   Patient Active Problem List   Diagnosis Date Noted  . Fatigue 10/18/2014  . Vision blurred 10/18/2014  . Mouth ulcer 10/18/2014  . Cheilosis 10/18/2014  . Raynaud phenomenon 10/18/2014  . Mood changes 04/10/2014  . Symptoms, such as flushing, sleeplessness, headache,  lack of concentration, associated with the menopause 12/25/2013  . Routine general medical examination at a health care facility 04/08/2011   Past Medical History  Diagnosis Date  . Irregular menses   . Hx: UTI (urinary tract infection)   . Ovarian cyst 01/2009 and 04/2009  . Fibroid 01/2009    small   Past Surgical History  Procedure Laterality Date  . Tubal ligation    . Partial hysterectomy  07/2009    1 tube removed   History  Substance Use Topics  . Smoking status: Never Smoker   . Smokeless tobacco: Never Used  . Alcohol Use: No   Family History  Problem Relation Age of Onset  . Hypertension Mother   . Cancer Mother     breast  . Hypertension Brother   . Colon polyps Mother    Allergies  Allergen Reactions  . Prozac [Fluoxetine Hcl]     Sluggishness/dismotivation   Current Outpatient Prescriptions on File Prior to Visit  Medication Sig Dispense Refill  . buPROPion (WELLBUTRIN XL) 150 MG 24 hr tablet Take 1 tablet (150 mg total) by mouth daily. 30 tablet 11  . diphenhydramine-acetaminophen (TYLENOL PM) 25-500 MG TABS Take 1 tablet by mouth at bedtime as needed.      Marland Kitchen ketoconazole (NIZORAL) 2 % cream Apply 1 application topically 2 (two) times daily. Apply small amount to  corners of mouth twice daily as needed 15 g 0  . meclizine (ANTIVERT) 25 MG tablet Take 1 tablet (25 mg total) by mouth every 6 (six) hours as needed. 30 tablet 1   No current facility-administered medications on file prior to visit.      Review of Systems Review of Systems  Constitutional: Negative for fever, appetite change, fatigue and unexpected weight change.  Eyes: Negative for pain and visual disturbance.  Respiratory: Negative for cough and shortness of breath.   Cardiovascular: Negative for cp or palpitations    Gastrointestinal: Negative for nausea, diarrhea and constipation.  Genitourinary: Negative for urgency and frequency.  Skin: Negative for pallor or rash   Neurological: Negative for weakness, light-headedness, numbness and headaches.  Hematological: Negative for adenopathy. Does not bruise/bleed easily.  Psychiatric/Behavioral: Negative for dysphoric mood. The patient is not nervous/anxious.         Objective:   Physical Exam  Constitutional: She appears well-developed and well-nourished. No distress.  HENT:  Head: Normocephalic and atraumatic.  Right Ear: External ear normal.  Left Ear: External ear normal.  Nose: Nose normal.  Mouth/Throat: Oropharynx is clear and moist.  No mouth lesions today  Eyes: Conjunctivae and EOM are normal. Pupils are equal, round, and reactive to light. Right eye exhibits no discharge. Left eye exhibits no discharge. No scleral icterus.  Neck: Normal range of motion. Neck supple. No JVD present. No thyromegaly present.  Cardiovascular: Normal rate, regular rhythm, normal heart sounds and intact distal pulses.  Exam reveals no gallop.   Pulmonary/Chest: Effort normal and breath sounds normal. No respiratory distress. She has no wheezes. She has no rales.  Abdominal: Soft. Bowel sounds are normal. She exhibits no distension and no mass. There is no tenderness.  Genitourinary: No breast swelling, tenderness, discharge or bleeding.    Breast exam: No mass, nodules, thickening, tenderness, bulging, retraction, inflamation, nipple discharge or skin changes noted.  No axillary or clavicular LA.      Musculoskeletal: She exhibits no edema or tenderness.  Lymphadenopathy:    She has no cervical adenopathy.  Neurological: She is alert.  She has normal reflexes. No cranial nerve deficit. She exhibits normal muscle tone. Coordination normal.  Skin: Skin is warm and dry. No rash noted. No erythema. No pallor.  Solar lentigos diffusely   Psychiatric: She has a normal mood and affect.          Assessment & Plan:   Problem List Items Addressed This Visit      Other   HSV (herpes simplex virus) infection   Relevant Medications   valACYclovir (VALTREX) 1000 MG tablet   Routine general medical examination at a health care facility - Primary    Reviewed health habits including diet and exercise and skin cancer prevention Reviewed appropriate screening tests for age  Also reviewed health mt list, fam hx and immunization status , as well as social and family history   She will continue wt watchers and start walking  Labs rev  Good cholesterol/ high HDL -commended

## 2015-01-01 NOTE — Patient Instructions (Signed)
Keep taking good care of yourself  Eat a healthy diet    Get back to walking  Labs look good - including cholesterol

## 2015-02-05 ENCOUNTER — Other Ambulatory Visit: Payer: Self-pay | Admitting: Family Medicine

## 2015-02-05 MED ORDER — KETOCONAZOLE 2 % EX CREA: 1.0000 "application " | TOPICAL_CREAM | Freq: Two times a day (BID) | CUTANEOUS | Status: DC

## 2015-02-05 NOTE — Telephone Encounter (Signed)
Please refill times one  

## 2015-02-05 NOTE — Telephone Encounter (Signed)
rx sent [facility]

## 2015-05-13 ENCOUNTER — Other Ambulatory Visit: Payer: Self-pay | Admitting: *Deleted

## 2015-05-13 MED ORDER — BUPROPION HCL ER (XL) 150 MG PO TB24: 150.0000 mg | ORAL_TABLET | Freq: Every day | ORAL | Status: DC

## 2015-05-13 NOTE — Telephone Encounter (Signed)
Received fax saying pt needs a 90 day supply of her Rx instead of 30, Rx changed

## 2015-10-22 ENCOUNTER — Encounter: Payer: Self-pay | Admitting: Family Medicine

## 2015-10-22 ENCOUNTER — Ambulatory Visit (INDEPENDENT_AMBULATORY_CARE_PROVIDER_SITE_OTHER): Payer: BLUE CROSS/BLUE SHIELD | Admitting: Family Medicine

## 2015-10-22 VITALS — BP 130/66 | HR 82 | Temp 98.4°F | Ht 67.0 in | Wt 154.0 lb

## 2015-10-22 DIAGNOSIS — Z23 Encounter for immunization: Secondary | ICD-10-CM | POA: Diagnosis not present

## 2015-10-22 DIAGNOSIS — F411 Generalized anxiety disorder: Secondary | ICD-10-CM

## 2015-10-22 MED ORDER — ESCITALOPRAM OXALATE 10 MG PO TABS
10.0000 mg | ORAL_TABLET | Freq: Every day | ORAL | Status: DC
Start: 2015-10-22 — End: 2016-10-29

## 2015-10-22 NOTE — Progress Notes (Signed)
Pre visit review using our clinic review tool, if applicable. No additional management support is needed unless otherwise documented below in the visit note. 

## 2015-10-22 NOTE — Patient Instructions (Addendum)
Continue wellbutrin  Add lexapro 10 mg daily for anxiety - it will take a while to work  If intolerable side effects or depression/ suicidal thoughts- then stop medication and let me know  After 2 weeks let me know if you want to increase to the 20 mg dose  Follow up in Feb as planned   Get back to exercise  Try to change work as much as you can to allow self care    Cut caffeine as much as possible

## 2015-10-22 NOTE — Progress Notes (Signed)
Subjective:    Patient ID: Abigail Kramer, female    DOB: 1966-09-28, 49 y.o.   MRN: [id number]  HPI Here for f/u of chronic conditions  Her anxiety level is worse the past few weeks Stress level is very high at work - very short handed /has to travel some   Does not see any improvement ahead in work situation  She does not want to leave Her supervisor is aware   Everything at home is ok   Not doing as well with her diet/ less time for self care Thinks she gained wt  No exercise   Husband changed jobs - and he is much happier   Also in the midst of menopause  Night sweats are worse in the past week   Lab Results  Component Value Date   TSH 1.67 12/25/2014    Patient Active Problem List   Diagnosis Date Noted  . Fatigue 10/18/2014  . Vision blurred 10/18/2014  . Mouth ulcer 10/18/2014  . HSV (herpes simplex virus) infection 10/18/2014  . Raynaud phenomenon 10/18/2014  . Mood changes ([place]) 04/10/2014  . Symptoms, such as flushing, sleeplessness, headache, lack of concentration, associated with the menopause 12/25/2013  . Routine general medical examination at a health care facility 04/08/2011   Past Medical History  Diagnosis Date  . Irregular menses   . Hx: UTI (urinary tract infection)   . Ovarian cyst 01/2009 and 04/2009  . Fibroid 01/2009    small  . HSV infection     oral - treated by dentist    Past Surgical History  Procedure Laterality Date  . Tubal ligation    . Partial hysterectomy  07/2009    1 tube removed   Social History  Substance Use Topics  . Smoking status: Never Smoker   . Smokeless tobacco: Never Used  . Alcohol Use: No   Family History  Problem Relation Age of Onset  . Hypertension Mother   . Cancer Mother     breast  . Hypertension Brother   . Colon polyps Mother    Allergies  Allergen Reactions  . Prozac [Fluoxetine Hcl]     Sluggishness/dismotivation   Current Outpatient Prescriptions on File Prior to Visit  Medication  Sig Dispense Refill  . buPROPion (WELLBUTRIN XL) 150 MG 24 hr tablet Take 1 tablet (150 mg total) by mouth daily. 90 tablet 2  . diphenhydramine-acetaminophen (TYLENOL PM) 25-500 MG TABS Take 1 tablet by mouth at bedtime as needed.      Marland Kitchen ketoconazole (NIZORAL) 2 % cream Apply 1 application topically 2 (two) times daily. Apply small amount to corners of mouth twice daily as needed 15 g 1  . meclizine (ANTIVERT) 25 MG tablet Take 1 tablet (25 mg total) by mouth every 6 (six) hours as needed. 30 tablet 1  . valACYclovir (VALTREX) 1000 MG tablet 2 tablets 12 hrs. apart as needed for cold sore     No current facility-administered medications on file prior to visit.     Review of Systems    Review of Systems  Constitutional: Negative for fever, appetite change, fatigue and unexpected weight change.  Eyes: Negative for pain and visual disturbance.  Respiratory: Negative for cough and shortness of breath.   Cardiovascular: Negative for cp or palpitations    Gastrointestinal: Negative for nausea, diarrhea and constipation.  Genitourinary: Negative for urgency and frequency.  Skin: Negative for pallor or rash   Neurological: Negative for weakness, light-headedness, numbness and headaches.  Hematological: Negative for adenopathy. Does not bruise/bleed easily.  Psychiatric/Behavioral: Negative for dysphoric mood. The patient is  nervous/anxious.  pos for irritability and feelings of being overwhelmed , neg for SI    Objective:   Physical Exam  Constitutional: She appears well-developed and well-nourished. No distress.  Well appearing   HENT:  Head: Normocephalic and atraumatic.  Mouth/Throat: Oropharynx is clear and moist.  Eyes: Conjunctivae and EOM are normal. Pupils are equal, round, and reactive to light.  Neck: Normal range of motion. Neck supple. No JVD present. Carotid bruit is not present. No thyromegaly present.  Cardiovascular: Normal rate, regular rhythm, normal heart sounds and  intact distal pulses.  Exam reveals no gallop.   Pulmonary/Chest: Effort normal and breath sounds normal. No respiratory distress. She has no wheezes. She has no rales.  No crackles  Abdominal: Soft. Bowel sounds are normal. She exhibits no distension, no abdominal bruit and no mass. There is no tenderness.  Musculoskeletal: She exhibits no edema.  Lymphadenopathy:    She has no cervical adenopathy.  Neurological: She is alert. She has normal reflexes.  Skin: Skin is warm and dry. No rash noted.  Psychiatric: Her speech is normal and behavior is normal. Thought content normal. Her mood appears anxious. Her affect is not blunt, not labile and not inappropriate. She does not exhibit a depressed mood.  Talkative and pleasant with good insight           Assessment & Plan:   Problem List Items Addressed This Visit      Other   Generalized anxiety disorder    Reviewed stressors/ coping techniques/symptoms/ support sources/ tx options and side effects in detail today More stress lately and more anxiety symptoms  Menopause could also add to this  Will continue wellbutrin Add lexapro 10 mg and titrate to 20 as tolerated Discussed expectations of SSRI medication including time to effectiveness and mechanism of action, also poss of side effects (early and late)- including mental fuzziness, weight or appetite change, nausea and poss of worse dep or anxiety (even suicidal thoughts)  Pt voiced understanding and will stop med and update if this occurs    >25 minutes spent in face to face time with patient, >50% spent in counselling or coordination of care F/u feb as planned or earlier if needed       Other Visit Diagnoses    Need for influenza vaccination    -  Primary    Relevant Orders    Flu Vaccine QUAD 36+ mos PF IM (Fluarix & Fluzone Quad PF) (Completed)

## 2015-10-23 NOTE — Assessment & Plan Note (Signed)
Reviewed stressors/ coping techniques/symptoms/ support sources/ tx options and side effects in detail today More stress lately and more anxiety symptoms  Menopause could also add to this  Will continue wellbutrin Add lexapro 10 mg and titrate to 20 as tolerated Discussed expectations of SSRI medication including time to effectiveness and mechanism of action, also poss of side effects (early and late)- including mental fuzziness, weight or appetite change, nausea and poss of worse dep or anxiety (even suicidal thoughts)  Pt voiced understanding and will stop med and update if this occurs    >25 minutes spent in face to face time with patient, >50% spent in counselling or coordination of care F/u feb as planned or earlier if needed

## 2015-11-06 ENCOUNTER — Telehealth: Payer: Self-pay

## 2015-11-06 NOTE — Telephone Encounter (Signed)
Pt notified of Dr. Marliss Coots comments, pt will stay at 10mg 

## 2015-11-06 NOTE — Telephone Encounter (Signed)
If she feels good now - we do not need to inc to 20 (if she changes her mind let me know) Stay at the 10 mg Glad she is doing better

## 2015-11-06 NOTE — Telephone Encounter (Signed)
Pt was seen 10/22/15; pt has been taking Lexapro 10 mg daily and has not had any side effects from med; pt is feeling a lot better with anxiousness and pt wanted to know if should continue taking the 10 mg or should she try to increase to 20 mg; pt was not sure if needed to increase to 20 mg since feeling better.[facility]. Pt request cb.

## 2016-01-05 ENCOUNTER — Telehealth: Payer: Self-pay | Admitting: Family Medicine

## 2016-01-05 NOTE — Telephone Encounter (Signed)
Pt has cpx labs on Friday 1/20.  She wanted to know if she could get genetic testing for breast cancer Family history of breast cancer Please advise

## 2016-01-05 NOTE — Telephone Encounter (Signed)
We do not do that in our lab  Would have to see a specialist  We can discuss it at her appointment

## 2016-01-05 NOTE — Telephone Encounter (Signed)
Patient notified as instructed by telephone and verbalized understanding. 

## 2016-01-08 ENCOUNTER — Other Ambulatory Visit: Payer: Self-pay | Admitting: Family Medicine

## 2016-01-08 ENCOUNTER — Telehealth: Payer: Self-pay | Admitting: Family Medicine

## 2016-01-08 DIAGNOSIS — Z0000 Encounter for general adult medical examination without abnormal findings: Secondary | ICD-10-CM

## 2016-01-08 NOTE — Telephone Encounter (Signed)
-----   Message from Ellamae Sia sent at 01/08/2016 10:35 AM EST ----- Regarding: Lab orders for Friday, 1.20.17   late add on Patient is scheduled for CPX labs, please order future labs, Thanks , Karna Christmas

## 2016-01-09 ENCOUNTER — Other Ambulatory Visit (INDEPENDENT_AMBULATORY_CARE_PROVIDER_SITE_OTHER): Payer: BLUE CROSS/BLUE SHIELD

## 2016-01-09 DIAGNOSIS — Z0000 Encounter for general adult medical examination without abnormal findings: Secondary | ICD-10-CM

## 2016-01-09 LAB — COMPREHENSIVE METABOLIC PANEL
ALT: 15 U/L (ref 6–29)
AST: 20 U/L (ref 10–35)
Albumin: 4.3 g/dL (ref 3.6–5.1)
Alkaline Phosphatase: 58 U/L (ref 33–130)
BILIRUBIN TOTAL: 0.3 mg/dL (ref 0.2–1.2)
BUN: 17 mg/dL (ref 7–25)
CALCIUM: 9.1 mg/dL (ref 8.6–10.4)
CO2: 27 mmol/L (ref 20–31)
CREATININE: 0.73 mg/dL (ref 0.50–1.05)
Chloride: 102 mmol/L (ref 98–110)
GLUCOSE: 91 mg/dL (ref 65–99)
Potassium: 4.4 mmol/L (ref 3.5–5.3)
SODIUM: 136 mmol/L (ref 135–146)
Total Protein: 7 g/dL (ref 6.1–8.1)

## 2016-01-09 LAB — LIPID PANEL
CHOL/HDL RATIO: 2.1 ratio (ref <=5.0)
Cholesterol: 238 mg/dL — ABNORMAL HIGH (ref 125–200)
HDL: 111 mg/dL (ref >=46)
LDL CALC: 105 mg/dL (ref <130)
Triglycerides: 112 mg/dL (ref <150)
VLDL: 22 mg/dL (ref <30)

## 2016-01-09 LAB — CBC WITH DIFFERENTIAL/PLATELET
BASOS ABS: 0.1 10*3/uL (ref 0.0–0.1)
Basophils Relative: 1 % (ref 0–1)
EOS PCT: 2 % (ref 0–5)
Eosinophils Absolute: 0.2 10*3/uL (ref 0.0–0.7)
HEMATOCRIT: 40.7 % (ref 36.0–46.0)
Hemoglobin: 13.7 g/dL (ref 12.0–15.0)
LYMPHS ABS: 3.2 10*3/uL (ref 0.7–4.0)
LYMPHS PCT: 38 % (ref 12–46)
MCH: 30.1 pg (ref 26.0–34.0)
MCHC: 33.7 g/dL (ref 30.0–36.0)
MCV: 89.5 fL (ref 78.0–100.0)
MPV: 10.7 fL (ref 8.6–12.4)
Monocytes Absolute: 0.5 10*3/uL (ref 0.1–1.0)
Monocytes Relative: 6 % (ref 3–12)
Neutro Abs: 4.4 10*3/uL (ref 1.7–7.7)
Neutrophils Relative %: 53 % (ref 43–77)
Platelets: 330 10*3/uL (ref 150–400)
RBC: 4.55 MIL/uL (ref 3.87–5.11)
RDW: 14 % (ref 11.5–15.5)
WBC: 8.3 10*3/uL (ref 4.0–10.5)

## 2016-01-10 LAB — TSH: TSH: 2.576 u[IU]/mL (ref 0.350–4.500)

## 2016-01-14 ENCOUNTER — Other Ambulatory Visit: Payer: Managed Care, Other (non HMO)

## 2016-01-18 ENCOUNTER — Other Ambulatory Visit: Payer: Self-pay | Admitting: Family Medicine

## 2016-01-21 ENCOUNTER — Ambulatory Visit (INDEPENDENT_AMBULATORY_CARE_PROVIDER_SITE_OTHER): Payer: BLUE CROSS/BLUE SHIELD | Admitting: Family Medicine

## 2016-01-21 ENCOUNTER — Encounter: Payer: Self-pay | Admitting: Family Medicine

## 2016-01-21 VITALS — BP 126/72 | HR 69 | Temp 98.5°F | Ht 66.75 in | Wt 154.0 lb

## 2016-01-21 DIAGNOSIS — Z0000 Encounter for general adult medical examination without abnormal findings: Secondary | ICD-10-CM | POA: Diagnosis not present

## 2016-01-21 DIAGNOSIS — R5382 Chronic fatigue, unspecified: Secondary | ICD-10-CM

## 2016-01-21 NOTE — Patient Instructions (Addendum)
Get your mammogram on Monday as planned  Continue self breast exams  Take care of yourself  Dr Lorelei Pont may be helpful for your foot - you can make an appointment with him here  Labs look good

## 2016-01-21 NOTE — Progress Notes (Signed)
Subjective:    Patient ID: Abigail Kramer, female    DOB: 07-30-1966, 50 y.o.   MRN: [id number]  HPI Here for health maintenance exam and to review chronic medical problems    Is doing ok overall  Working a lot    [employer] [facility] is stable with bmi of 24 Eats fairly healthy  Would like to exercise more    --having foot problems lately  Needs another shot for it    HIV screen -not high risk/declines   Pap nl 1/09- has had a partial hysterectomy since  Fibroids  No pelvic pain or d/c   Mm 11/15 nl - has it scheduled Monday  Self breast exam-no lump or changes  Mother had breast cancer   Flu shot 11/16  Had a Tdap 7/13   Colonoscopy 3/11 nl - 10 year recall    Results for orders placed or performed in visit on 01/09/16  Lipid panel  Result Value Ref Range   Cholesterol 238 (H) 125 - 200 mg/dL   Triglycerides 112 <150 mg/dL   HDL 111 >=46 mg/dL   Total CHOL/HDL Ratio 2.1 <=5.0 Ratio   VLDL 22 <30 mg/dL   LDL Cholesterol 105 <130 mg/dL  Comprehensive metabolic panel  Result Value Ref Range   Sodium 136 135 - 146 mmol/L   Potassium 4.4 3.5 - 5.3 mmol/L   Chloride 102 98 - 110 mmol/L   CO2 27 20 - 31 mmol/L   Glucose, Bld 91 65 - 99 mg/dL   BUN 17 7 - 25 mg/dL   Creat 0.73 0.50 - 1.05 mg/dL   Total Bilirubin 0.3 0.2 - 1.2 mg/dL   Alkaline Phosphatase 58 33 - 130 U/L   AST 20 10 - 35 U/L   ALT 15 6 - 29 U/L   Total Protein 7.0 6.1 - 8.1 g/dL   Albumin 4.3 3.6 - 5.1 g/dL   Calcium 9.1 8.6 - 10.4 mg/dL  TSH  Result Value Ref Range   TSH 2.576 0.350 - 4.500 uIU/mL  CBC with Differential/Platelet  Result Value Ref Range   WBC 8.3 4.0 - 10.5 K/uL   RBC 4.55 3.87 - 5.11 MIL/uL   Hemoglobin 13.7 12.0 - 15.0 g/dL   HCT 40.7 36.0 - 46.0 %   MCV 89.5 78.0 - 100.0 fL   MCH 30.1 26.0 - 34.0 pg   MCHC 33.7 30.0 - 36.0 g/dL   RDW 14.0 11.5 - 15.5 %   Platelets 330 150 - 400 K/uL   MPV 10.7 8.6 - 12.4 fL   Neutrophils Relative % 53 43 - 77 %   Neutro Abs 4.4 1.7 - 7.7 K/uL   Lymphocytes Relative 38 12 - 46 %   Lymphs Abs 3.2 0.7 - 4.0 K/uL   Monocytes Relative 6 3 - 12 %   Monocytes Absolute 0.5 0.1 - 1.0 K/uL   Eosinophils Relative 2 0 - 5 %   Eosinophils Absolute 0.2 0.0 - 0.7 K/uL   Basophils Relative 1 0 - 1 %   Basophils Absolute 0.1 0.0 - 0.1 K/uL   Smear Review Criteria for review not met      Cholesterol  Lab Results  Component Value Date   CHOL 238* 01/09/2016   CHOL 226* 12/25/2014   CHOL 225* 12/18/2013   Lab Results  Component Value Date   HDL 111 01/09/2016   HDL 103.10 12/25/2014   HDL 103.30 12/18/2013   Lab Results  Component Value Date   LDLCALC  105 01/09/2016   LDLCALC 110* 12/25/2014   LDLCALC 81 04/13/2011   Lab Results  Component Value Date   TRIG 112 01/09/2016   TRIG 64.0 12/25/2014   TRIG 120.0 12/18/2013   Lab Results  Component Value Date   CHOLHDL 2.1 01/09/2016   CHOLHDL 2 12/25/2014   CHOLHDL 2 12/18/2013   Lab Results  Component Value Date   LDLDIRECT 99.3 12/18/2013   LDLDIRECT 90.3 01/17/2009    Very good cholesterol profile  Great HDL  Eats fairly healthy   Patient Active Problem List   Diagnosis Date Noted  . Generalized anxiety disorder 10/22/2015  . Fatigue 10/18/2014  . Vision blurred 10/18/2014  . Mouth ulcer 10/18/2014  . HSV (herpes simplex virus) infection 10/18/2014  . Raynaud phenomenon 10/18/2014  . Mood changes ([place]) 04/10/2014  . Symptoms, such as flushing, sleeplessness, headache, lack of concentration, associated with the menopause 12/25/2013  . Routine general medical examination at a health care facility 04/08/2011   Past Medical History  Diagnosis Date  . Irregular menses   . Hx: UTI (urinary tract infection)   . Ovarian cyst 01/2009 and 04/2009  . Fibroid 01/2009    small  . HSV infection     oral - treated by dentist    Past Surgical History  Procedure Laterality Date  . Tubal ligation    . Partial hysterectomy  07/2009    1 tube removed   Social History    Substance Use Topics  . Smoking status: Never Smoker   . Smokeless tobacco: Never Used  . Alcohol Use: No   Family History  Problem Relation Age of Onset  . Hypertension Mother   . Cancer Mother     breast  . Hypertension Brother   . Colon polyps Mother    Allergies  Allergen Reactions  . Prozac [Fluoxetine Hcl]     Sluggishness/dismotivation   Current Outpatient Prescriptions on File Prior to Visit  Medication Sig Dispense Refill  . buPROPion (WELLBUTRIN XL) 150 MG 24 hr tablet Take 1 tablet (150 mg total) by mouth daily. 90 tablet 2  . diphenhydramine-acetaminophen (TYLENOL PM) 25-500 MG TABS Take 1 tablet by mouth at bedtime as needed.      Marland Kitchen escitalopram (LEXAPRO) 10 MG tablet Take 1 tablet (10 mg total) by mouth daily. 30 tablet 11  . ketoconazole (NIZORAL) 2 % cream Apply 1 application topically 2 (two) times daily. Apply small amount to corners of mouth twice daily as needed 15 g 1  . meclizine (ANTIVERT) 25 MG tablet Take 1 tablet (25 mg total) by mouth every 6 (six) hours as needed. 30 tablet 1  . valACYclovir (VALTREX) 1000 MG tablet Take 1,000 mg by mouth daily.      No current facility-administered medications on file prior to visit.    Review of Systems Review of Systems  Constitutional: Negative for fever, appetite change,  and unexpected weight change. pos for fatigue  Eyes: Negative for pain and visual disturbance.  Respiratory: Negative for cough and shortness of breath.   Cardiovascular: Negative for cp or palpitations    Gastrointestinal: Negative for nausea, diarrhea and constipation.  Genitourinary: Negative for urgency and frequency.  MSK pos for foot pain  Skin: Negative for pallor or rash   Neurological: Negative for weakness, light-headedness, numbness and headaches.  Hematological: Negative for adenopathy. Does not bruise/bleed easily.  Psychiatric/Behavioral: Negative for dysphoric mood. The patient is not nervous/anxious.         Objective:  Physical Exam  Constitutional: She appears well-developed and well-nourished. No distress.  Well appearing   HENT:  Head: Normocephalic and atraumatic.  Right Ear: External ear normal.  Left Ear: External ear normal.  Nose: Nose normal.  Mouth/Throat: Oropharynx is clear and moist.  Eyes: Conjunctivae and EOM are normal. Pupils are equal, round, and reactive to light. Right eye exhibits no discharge. Left eye exhibits no discharge. No scleral icterus.  Neck: Normal range of motion. Neck supple. No JVD present. Carotid bruit is not present. No thyromegaly present.  Cardiovascular: Normal rate, regular rhythm, normal heart sounds and intact distal pulses.  Exam reveals no gallop.   Baseline raynaud skin changes in feet with normal pedal pulses   Pulmonary/Chest: Effort normal and breath sounds normal. No respiratory distress. She has no wheezes. She has no rales.  Abdominal: Soft. Bowel sounds are normal. She exhibits no distension and no mass. There is no tenderness.  Musculoskeletal: She exhibits no edema or tenderness.  Lymphadenopathy:    She has no cervical adenopathy.  Neurological: She is alert. She has normal reflexes. No cranial nerve deficit. She exhibits normal muscle tone. Coordination normal.  Skin: Skin is warm and dry. No rash noted. No erythema. No pallor.  Psychiatric: She has a normal mood and affect.          Assessment & Plan:   Problem List Items Addressed This Visit      Other   Fatigue    Still some fatigue - suspect due to menopausal hormone change  Enc exercise and good habits       Routine general medical examination at a health care facility - Primary    Reviewed health habits including diet and exercise and skin cancer prevention Reviewed appropriate screening tests for age  Also reviewed health mt list, fam hx and immunization status , as well as social and family history    See HPI Labs reviewed  Get your mammogram on Monday as planned   Continue self breast exams  Take care of yourself

## 2016-01-21 NOTE — Progress Notes (Signed)
Pre visit review using our clinic review tool, if applicable. No additional management support is needed unless otherwise documented below in the visit note. 

## 2016-01-22 NOTE — Assessment & Plan Note (Signed)
Reviewed health habits including diet and exercise and skin cancer prevention Reviewed appropriate screening tests for age  Also reviewed health mt list, fam hx and immunization status , as well as social and family history    See HPI Labs reviewed  Get your mammogram on Monday as planned  Continue self breast exams  Take care of yourself

## 2016-01-22 NOTE — Assessment & Plan Note (Signed)
Still some fatigue - suspect due to menopausal hormone change  Enc exercise and good habits

## 2016-01-23 ENCOUNTER — Other Ambulatory Visit: Payer: Self-pay | Admitting: Family Medicine

## 2016-01-29 ENCOUNTER — Encounter: Payer: Self-pay | Admitting: Family Medicine

## 2016-02-11 ENCOUNTER — Ambulatory Visit (INDEPENDENT_AMBULATORY_CARE_PROVIDER_SITE_OTHER): Payer: BLUE CROSS/BLUE SHIELD | Admitting: Family Medicine

## 2016-02-11 ENCOUNTER — Encounter: Payer: Self-pay | Admitting: Family Medicine

## 2016-02-11 ENCOUNTER — Ambulatory Visit (INDEPENDENT_AMBULATORY_CARE_PROVIDER_SITE_OTHER)
Admission: RE | Admit: 2016-02-11 | Discharge: 2016-02-11 | Disposition: A | Discharge status: Home / Self Care | Payer: BLUE CROSS/BLUE SHIELD | Source: Ambulatory Visit | Attending: Family Medicine | Admitting: Family Medicine

## 2016-02-11 VITALS — BP 124/80 | HR 75 | Temp 98.7°F | Ht 66.75 in | Wt 157.5 lb

## 2016-02-11 DIAGNOSIS — M79671 Pain in right foot: Secondary | ICD-10-CM | POA: Diagnosis not present

## 2016-02-11 DIAGNOSIS — S92402A Displaced unspecified fracture of left great toe, initial encounter for closed fracture: Secondary | ICD-10-CM

## 2016-02-11 DIAGNOSIS — M79675 Pain in left toe(s): Secondary | ICD-10-CM

## 2016-02-11 NOTE — Progress Notes (Signed)
Pre visit review using our clinic review tool, if applicable. No additional management support is needed unless otherwise documented below in the visit note. 

## 2016-02-11 NOTE — Progress Notes (Signed)
Dr. Frederico Hamman T. Reagan Klemz, MD, [facility] [place], [address] Phone: [phone] Fax: [phone]  02/11/2016  Patient: Abigail Kramer, MRN: [id number], DOB: [birth year], 50 y.o.  Primary Physician:  Loura Pardon, MD   Chief Complaint  Patient presents with  . Toe Injury    Left Big Toe-Fell on Saturdday  . Foot Pain    Right   Subjective:   Abigail Kramer is a 50 y.o. very pleasant female patient who presents with the following:  R foot pain  L great toe / foot. Fell on Sunday. The patient fell up the stairs, and she injured her great toe, and since that time she has been having some pain.  This has improved somewhat over the last few days, but it is hurting directly at the joint line and in the region of the proximal phalanx medially.  She also has a history of multiple a Bence where she had some plantar fasciitis, and she thinks that she might be getting some early plantar fasciitis right now on the right heel.  She would like to get some recommendations from a shoe standpoint.    Past Medical History, Surgical History, Social History, Family History, Problem List, Medications, and Allergies have been reviewed and updated if relevant.  Patient Active Problem List   Diagnosis Date Noted  . Generalized anxiety disorder 10/22/2015  . Fatigue 10/18/2014  . Mouth ulcer 10/18/2014  . HSV (herpes simplex virus) infection 10/18/2014  . Raynaud phenomenon 10/18/2014  . Symptoms, such as flushing, sleeplessness, headache, lack of concentration, associated with the menopause 12/25/2013  . Routine general medical examination at a health care facility 04/08/2011    Past Medical History  Diagnosis Date  . Irregular menses   . Hx: UTI (urinary tract infection)   . Ovarian cyst 01/2009 and 04/2009  . Fibroid 01/2009    small  . HSV infection     oral - treated by dentist     Past Surgical History  Procedure Laterality  Date  . Tubal ligation    . Partial hysterectomy  07/2009    1 tube removed    Social History   Social History  . Marital Status: Married    Spouse Name: N/A  . Number of Children: 2  . Years of Education: N/A   Occupational History  . Sales    Social History Main Topics  . Smoking status: Never Smoker   . Smokeless tobacco: Never Used  . Alcohol Use: No  . Drug Use: No  . Sexual Activity: Not on file   Other Topics Concern  . Not on file   Social History Narrative    Family History  Problem Relation Age of Onset  . Hypertension Mother   . Cancer Mother     breast  . Hypertension Brother   . Colon polyps Mother     Allergies  Allergen Reactions  . Prozac [Fluoxetine Hcl]     Sluggishness/dismotivation    Medication list reviewed and updated in full in [place].  GEN: No fevers, chills. Nontoxic. Primarily MSK c/o today. MSK: Detailed in the HPI GI: tolerating PO intake without difficulty Neuro: No numbness, parasthesias, or tingling associated. Otherwise the pertinent positives of the ROS are noted above.   Objective:   BP 124/80 mmHg  Pulse 75  Temp(Src) 98.7 F (37.1 C) (Oral)  Ht 5' 6.75" (1.695 m)  Wt 157 lb 8 oz (  71.442 kg)  BMI 24.87 kg/m2  LMP 12/20/2008   GEN: WDWN, NAD, Non-toxic, Alert & Oriented x 3 HEENT: Atraumatic, Normocephalic.  Ears and Nose: No external deformity. EXTR: No clubbing/cyanosis/edema NEURO: Normal gait.  PSYCH: Normally interactive. Conversant. Not depressed or anxious appearing.  Calm demeanor.   FEET: B Echymosis: no Edema: no ROM: full LE B Gait: heel toe, non-antalgic MT pain: no Callus pattern: none Lateral Mall: NT Medial Mall: NT Talus: NT Navicular: NT Cuboid: NT Calcaneous: NT Metatarsals: NT 5th MT: NT Phalanges: NT Achilles: NT Plantar Fascia: mild ttp on the R Fat Pad: NT Peroneals: NT Post Tib: NT Great Toe: TTP AT THE PROX PHALANX MEDIALLY Ant Drawer: neg ATFL: NT CFL:  NT Deltoid: NT Other foot breakdown: none Long arch: preserved Transverse arch: preserved Hindfoot breakdown: none Sensation: intact   Radiology: Dg Toe Great Left  02/11/2016  CLINICAL DATA:  Recent fall, now with left great toe pain EXAM: LEFT GREAT TOE COMPARISON:  None. FINDINGS: No displaced fracture is seen. However on the frontal view, there is a linear lucency through the base of the proximal phalanx of the left great toe which may represent a nondisplaced fracture not visible on other images. Clinical correlation is recommended. IMPRESSION: Cannot exclude a nondisplaced fracture through the base of the proximal phalanx of the left great toe. Electronically Signed   By: Ivar Drape M.D.   On: 02/11/2016 14:08    The radiological images were independently reviewed by myself in the office and results were reviewed with the patient. My independent interpretation of images:  There appears to be a nondisplaced fracture of the proximal phalanx on the left first digit.  This is visualized on the frontal view.  I believe that I can also see this on the oblique.  This correlates clinically with the patient's point of maximal pain.Electronically Signed  By: Owens Loffler, MD On: 02/12/2016 10:02 AM   Assessment and Plan:   Fracture of great toe, left, closed, initial encounter  Toe pain, left - Plan: DG Toe Great Left  Right foot pain  Small fracture, she will likely do better if she is in a stiff soled shoe.  I don't think she needs anything else over and above this.  She is ambulating on this without difficulty right now.  We also reviewed various types of shoes, where to purchase them locally, and recommendations based on her history and foot type.  Follow-up: No Follow-up on file.  Orders Placed This Encounter  Procedures  . DG Toe Great Left    Signed,  Maud Deed. Masin Shatto, MD   Patient's Medications  New Prescriptions   No medications on file  Previous Medications    BUPROPION (WELLBUTRIN XL) 150 MG 24 HR TABLET    TAKE 1 TABLET (150 MG TOTAL) BY MOUTH DAILY.   DIPHENHYDRAMINE-ACETAMINOPHEN (TYLENOL PM) 25-500 MG TABS    Take 1 tablet by mouth at bedtime as needed.     ESCITALOPRAM (LEXAPRO) 10 MG TABLET    Take 1 tablet (10 mg total) by mouth daily.   KETOCONAZOLE (NIZORAL) 2 % CREAM    Apply 1 application topically 2 (two) times daily. Apply small amount to corners of mouth twice daily as needed   MECLIZINE (ANTIVERT) 25 MG TABLET    Take 1 tablet (25 mg total) by mouth every 6 (six) hours as needed.   NUTRITIONAL SUPPLEMENTS (ESTROVEN PO)    Take 1 tablet by mouth daily.   VALACYCLOVIR (VALTREX) 1000 MG  TABLET    Take 1,000 mg by mouth daily.   Modified Medications   No medications on file  Discontinued Medications   No medications on file

## 2016-02-11 NOTE — Patient Instructions (Signed)
Excellent Over the Counter Orthotics:  Hapad: available at www.hapad.com (Green Sports Insoles)  SPENCO: Available at some sports stores or www.amazon.com. (I prefer the full-length green orthotic that has a yellow bottom / base)  www.pedifix.com and "[employer] [facility]" website: multiple good foot products  SHOES: Danskos, Merrells, Keens, Clarks, Birkenstocks - good arch support, want minimal bendability. Finn Comfort also excellent, but even more expensive.  Tennis Shoes / Running Shoes: Many good companies and styles. The most important thing is to get a good fit and wear a shoe that feels good in the store. Walk around in the store. Run if that is something that you can do. Some of the running stores have a treadmill, also.  Brooks, New Balance, Saucony are good, but the most important thing is to wear a shoe that fits your foot and feels good.  [employer] in [place]: excellent staff, shoe selection (Running and Athletic Shoes), OTC orthotics. On [address] across from the [employer]. For running shoe and athletic shoe fit, they are the best.  Nicer shoes:  Birkenstock shoes, [place], [place]: Excellent selection  [employer], [address] [place], [place]: They have the largest selection of comfort and supportive shoes that I have ever seen, and their staff is generally quite good in fitting you for shoes. (They will intermittently have sales, mail coupons, and you can look on their website.)  [employer]: [address], [place], [place]    

## 2016-06-07 DIAGNOSIS — M71571 Other bursitis, not elsewhere classified, right ankle and foot: Secondary | ICD-10-CM | POA: Diagnosis not present

## 2016-06-07 DIAGNOSIS — M722 Plantar fascial fibromatosis: Secondary | ICD-10-CM | POA: Diagnosis not present

## 2016-08-04 DIAGNOSIS — H43813 Vitreous degeneration, bilateral: Secondary | ICD-10-CM | POA: Diagnosis not present

## 2016-08-04 DIAGNOSIS — H5213 Myopia, bilateral: Secondary | ICD-10-CM | POA: Diagnosis not present

## 2016-08-04 DIAGNOSIS — H52203 Unspecified astigmatism, bilateral: Secondary | ICD-10-CM | POA: Diagnosis not present

## 2016-08-04 DIAGNOSIS — H524 Presbyopia: Secondary | ICD-10-CM | POA: Diagnosis not present

## 2016-08-10 DIAGNOSIS — H16041 Marginal corneal ulcer, right eye: Secondary | ICD-10-CM | POA: Diagnosis not present

## 2016-08-16 DIAGNOSIS — H16041 Marginal corneal ulcer, right eye: Secondary | ICD-10-CM | POA: Diagnosis not present

## 2016-08-26 DIAGNOSIS — H16041 Marginal corneal ulcer, right eye: Secondary | ICD-10-CM | POA: Diagnosis not present

## 2016-10-29 ENCOUNTER — Other Ambulatory Visit: Payer: Self-pay | Admitting: Family Medicine

## 2016-10-29 NOTE — Telephone Encounter (Signed)
No recent/future appts please advise  

## 2016-10-31 NOTE — Telephone Encounter (Signed)
Will refill electronically Please schedule PE in about 6 mo

## 2016-11-03 NOTE — Telephone Encounter (Signed)
Abigail Kramer scheduled appts

## 2016-12-01 DIAGNOSIS — Z803 Family history of malignant neoplasm of breast: Secondary | ICD-10-CM | POA: Diagnosis not present

## 2017-01-10 ENCOUNTER — Telehealth: Payer: Self-pay | Admitting: Family Medicine

## 2017-01-10 ENCOUNTER — Encounter: Payer: Self-pay | Admitting: Family Medicine

## 2017-01-10 DIAGNOSIS — Z0000 Encounter for general adult medical examination without abnormal findings: Secondary | ICD-10-CM

## 2017-01-10 DIAGNOSIS — I7300 Raynaud's syndrome without gangrene: Secondary | ICD-10-CM

## 2017-01-10 NOTE — Telephone Encounter (Signed)
Future labs for PE incl ANA

## 2017-01-11 ENCOUNTER — Encounter: Payer: Self-pay | Admitting: Family Medicine

## 2017-01-11 DIAGNOSIS — Z1231 Encounter for screening mammogram for malignant neoplasm of breast: Secondary | ICD-10-CM | POA: Diagnosis not present

## 2017-01-11 DIAGNOSIS — Z803 Family history of malignant neoplasm of breast: Secondary | ICD-10-CM | POA: Diagnosis not present

## 2017-01-17 ENCOUNTER — Other Ambulatory Visit (INDEPENDENT_AMBULATORY_CARE_PROVIDER_SITE_OTHER): Payer: BLUE CROSS/BLUE SHIELD

## 2017-01-17 DIAGNOSIS — I7300 Raynaud's syndrome without gangrene: Secondary | ICD-10-CM | POA: Diagnosis not present

## 2017-01-17 DIAGNOSIS — Z0000 Encounter for general adult medical examination without abnormal findings: Secondary | ICD-10-CM | POA: Diagnosis not present

## 2017-01-17 LAB — CBC WITH DIFFERENTIAL/PLATELET
BASOS ABS: 0 10*3/uL (ref 0.0–0.1)
BASOS PCT: 0.8 % (ref 0.0–3.0)
EOS ABS: 0.1 10*3/uL (ref 0.0–0.7)
Eosinophils Relative: 1 % (ref 0.0–5.0)
HCT: 39 % (ref 36.0–46.0)
Hemoglobin: 13.1 g/dL (ref 12.0–15.0)
Lymphocytes Relative: 38.8 % (ref 12.0–46.0)
Lymphs Abs: 2.1 10*3/uL (ref 0.7–4.0)
MCHC: 33.6 g/dL (ref 30.0–36.0)
MCV: 90 fl (ref 78.0–100.0)
MONO ABS: 0.5 10*3/uL (ref 0.1–1.0)
Monocytes Relative: 8.6 % (ref 3.0–12.0)
NEUTROS PCT: 50.8 % (ref 43.0–77.0)
Neutro Abs: 2.7 10*3/uL (ref 1.4–7.7)
PLATELETS: 254 10*3/uL (ref 150.0–400.0)
RBC: 4.33 Mil/uL (ref 3.87–5.11)
RDW: 13.7 % (ref 11.5–15.5)
WBC: 5.4 10*3/uL (ref 4.0–10.5)

## 2017-01-17 LAB — COMPREHENSIVE METABOLIC PANEL
ALT: 13 U/L (ref 0–35)
AST: 20 U/L (ref 0–37)
Albumin: 4 g/dL (ref 3.5–5.2)
Alkaline Phosphatase: 59 U/L (ref 39–117)
BUN: 9 mg/dL (ref 6–23)
CHLORIDE: 102 meq/L (ref 96–112)
CO2: 29 meq/L (ref 19–32)
CREATININE: 0.77 mg/dL (ref 0.40–1.20)
Calcium: 9 mg/dL (ref 8.4–10.5)
GFR: 83.87 mL/min (ref >60.00)
GLUCOSE: 92 mg/dL (ref 70–99)
Potassium: 3.9 mEq/L (ref 3.5–5.1)
SODIUM: 137 meq/L (ref 135–145)
Total Bilirubin: 0.3 mg/dL (ref 0.2–1.2)
Total Protein: 6.9 g/dL (ref 6.0–8.3)

## 2017-01-17 LAB — LIPID PANEL
CHOL/HDL RATIO: 2
Cholesterol: 203 mg/dL — ABNORMAL HIGH (ref 0–200)
HDL: 82.5 mg/dL (ref >39.00)
LDL CALC: 94 mg/dL (ref 0–99)
NONHDL: 120.43
TRIGLYCERIDES: 133 mg/dL (ref 0.0–149.0)
VLDL: 26.6 mg/dL (ref 0.0–40.0)

## 2017-01-17 LAB — TSH: TSH: 2.77 u[IU]/mL (ref 0.35–4.50)

## 2017-01-19 LAB — ANTI-NUCLEAR AB-TITER (ANA TITER)

## 2017-01-19 LAB — ANA: ANA: POSITIVE — AB

## 2017-01-23 ENCOUNTER — Other Ambulatory Visit: Payer: Self-pay | Admitting: Family Medicine

## 2017-01-26 ENCOUNTER — Encounter: Payer: Self-pay | Admitting: Family Medicine

## 2017-01-26 ENCOUNTER — Ambulatory Visit (INDEPENDENT_AMBULATORY_CARE_PROVIDER_SITE_OTHER): Payer: BLUE CROSS/BLUE SHIELD | Admitting: Family Medicine

## 2017-01-26 VITALS — BP 130/80 | HR 76 | Temp 98.6°F | Ht 66.75 in | Wt 155.0 lb

## 2017-01-26 DIAGNOSIS — N951 Menopausal and female climacteric states: Secondary | ICD-10-CM

## 2017-01-26 DIAGNOSIS — Z0000 Encounter for general adult medical examination without abnormal findings: Secondary | ICD-10-CM

## 2017-01-26 DIAGNOSIS — Z23 Encounter for immunization: Secondary | ICD-10-CM

## 2017-01-26 DIAGNOSIS — R41840 Attention and concentration deficit: Secondary | ICD-10-CM

## 2017-01-26 DIAGNOSIS — I7300 Raynaud's syndrome without gangrene: Secondary | ICD-10-CM

## 2017-01-26 DIAGNOSIS — R768 Other specified abnormal immunological findings in serum: Secondary | ICD-10-CM

## 2017-01-26 MED ORDER — ESCITALOPRAM OXALATE 20 MG PO TABS: 20.0000 mg | ORAL_TABLET | Freq: Every day | ORAL | 11 refills | Status: DC

## 2017-01-26 MED ORDER — BUPROPION HCL ER (XL) 150 MG PO TB24: ORAL_TABLET | ORAL | 11 refills | Status: DC

## 2017-01-26 NOTE — Progress Notes (Signed)
Pre visit review using our clinic review tool, if applicable. No additional management support is needed unless otherwise documented below in the visit note. 

## 2017-01-26 NOTE — Progress Notes (Signed)
Subjective:    Patient ID: Abigail Kramer, female    DOB: 23-Nov-1966, 50 y.o.   MRN: [id number]  HPI Here for health maintenance exam and to review chronic medical problems    Having a lot of menopausal issues  Worst is difficulty concentrating  Had a hyst in the past   Mother had breast cancer    Wt Readings from Last 3 Encounters:  01/26/17 155 lb (70.3 kg)  02/11/16 157 lb 8 oz (71.4 kg)  01/21/16 154 lb (69.9 kg)  going to a trainer  Eating a healthy diet  bmi is 24.4  Hep C/ HIV screening - decline/low risk   Mammogram 1/18 negative  Self breast exam - no lumps or changes   Pap/gyn care  Pap nl 09 Had partial hysterectomy in 2010 with 1 tube removed and a tubal ligation in the past  She thought she had a kidney stone 2 wk ago - pain suprapubic and rad to back  Gone now    Flu vaccine - thought she had the flu/pretty certain, has not had the flu  She still wants to get a flu shot  Tetanus vaccine 7/13  Colonoscopy/ screening  Colonoscopy 3/11 normal with 10 year recall   Had a fractured toe in 2/17- not a fragility fracture  Does not take ca and D That is better   ANA is positive  Weakly 1:160  Has raynauds Had 2 eye inf this year  Takes HSV Wonders about sjogrens - father - Oran Rein, daughter had Connective tissue disorder  No swollen or red joints  Has am stiffness  Exercise helps  Unsure if she wants to go to rheumatologist yet   Results for orders placed or performed in visit on 01/17/17  CBC with Differential/Platelet  Result Value Ref Range   WBC 5.4 4.0 - 10.5 K/uL   RBC 4.33 3.87 - 5.11 Mil/uL   Hemoglobin 13.1 12.0 - 15.0 g/dL   HCT 39.0 36.0 - 46.0 %   MCV 90.0 78.0 - 100.0 fl   MCHC 33.6 30.0 - 36.0 g/dL   RDW 13.7 11.5 - 15.5 %   Platelets 254.0 150.0 - 400.0 K/uL   Neutrophils Relative % 50.8 43.0 - 77.0 %   Lymphocytes Relative 38.8 12.0 - 46.0 %   Monocytes Relative 8.6 3.0 - 12.0 %   Eosinophils Relative 1.0 0.0 -  5.0 %   Basophils Relative 0.8 0.0 - 3.0 %   Neutro Abs 2.7 1.4 - 7.7 K/uL   Lymphs Abs 2.1 0.7 - 4.0 K/uL   Monocytes Absolute 0.5 0.1 - 1.0 K/uL   Eosinophils Absolute 0.1 0.0 - 0.7 K/uL   Basophils Absolute 0.0 0.0 - 0.1 K/uL  Comprehensive metabolic panel  Result Value Ref Range   Sodium 137 135 - 145 mEq/L   Potassium 3.9 3.5 - 5.1 mEq/L   Chloride 102 96 - 112 mEq/L   CO2 29 19 - 32 mEq/L   Glucose, Bld 92 70 - 99 mg/dL   BUN 9 6 - 23 mg/dL   Creatinine, Ser 0.77 0.40 - 1.20 mg/dL   Total Bilirubin 0.3 0.2 - 1.2 mg/dL   Alkaline Phosphatase 59 39 - 117 U/L   AST 20 0 - 37 U/L   ALT 13 0 - 35 U/L   Total Protein 6.9 6.0 - 8.3 g/dL   Albumin 4.0 3.5 - 5.2 g/dL   Calcium 9.0 8.4 - 10.5 mg/dL   GFR 83.87 >60.00 mL/min  Lipid panel  Result Value Ref Range   Cholesterol 203 (H) 0 - 200 mg/dL   Triglycerides 133.0 0.0 - 149.0 mg/dL   HDL 82.50 >39.00 mg/dL   VLDL 26.6 0.0 - 40.0 mg/dL   LDL Cholesterol 94 0 - 99 mg/dL   Total CHOL/HDL Ratio 2    NonHDL 120.43   TSH  Result Value Ref Range   TSH 2.77 0.35 - 4.50 uIU/mL  ANA  Result Value Ref Range   Anit Nuclear Antibody(ANA) POS (A) NEGATIVE  Anti-nuclear ab-titer (ANA titer)  Result Value Ref Range   ANA Pattern 1 HOMOGENEOUS (A)    ANA Titer 1 1:160 (H) titer    Patient Active Problem List   Diagnosis Date Noted  . Difficulty concentrating 01/26/2017  . Positive ANA (antinuclear antibody) 01/26/2017  . Generalized anxiety disorder 10/22/2015  . Fatigue 10/18/2014  . Mouth ulcer 10/18/2014  . HSV (herpes simplex virus) infection 10/18/2014  . Raynaud phenomenon 10/18/2014  . Symptoms, such as flushing, sleeplessness, headache, lack of concentration, associated with the menopause 12/25/2013  . Routine general medical examination at a health care facility 04/08/2011   Past Medical History:  Diagnosis Date  . Fibroid 01/2009   small  . HSV infection    oral - treated by dentist   . Hx: UTI (urinary tract  infection)   . Irregular menses   . Ovarian cyst 01/2009 and 04/2009   Past Surgical History:  Procedure Laterality Date  . PARTIAL HYSTERECTOMY  07/2009   1 tube removed  . TUBAL LIGATION     Social History  Substance Use Topics  . Smoking status: Never Smoker  . Smokeless tobacco: Never Used  . Alcohol use No   Family History  Problem Relation Age of Onset  . Hypertension Mother   . Cancer Mother     breast  . Hypertension Brother   . Colon polyps Mother    Allergies  Allergen Reactions  . Prozac [Fluoxetine Hcl]     Sluggishness/dismotivation   Current Outpatient Prescriptions on File Prior to Visit  Medication Sig Dispense Refill  . diphenhydramine-acetaminophen (TYLENOL PM) 25-500 MG TABS Take 1 tablet by mouth at bedtime as needed.      Marland Kitchen ketoconazole (NIZORAL) 2 % cream Apply 1 application topically 2 (two) times daily. Apply small amount to corners of mouth twice daily as needed 15 g 1  . meclizine (ANTIVERT) 25 MG tablet Take 1 tablet (25 mg total) by mouth every 6 (six) hours as needed. 30 tablet 1  . Nutritional Supplements (ESTROVEN PO) Take 1 tablet by mouth daily.    . valACYclovir (VALTREX) 1000 MG tablet Take 1,000 mg by mouth daily.      No current facility-administered medications on file prior to visit.     Review of Systems Review of Systems  Constitutional: Negative for fever, appetite change, and unexpected weight change. pos for fatigue and poor sleep with menopause  ENT pos for HSV 1 and chronic dry mouth sensation  Eyes: Negative for pain and visual disturbance.  Respiratory: Negative for cough and shortness of breath.   Cardiovascular: Negative for cp or palpitations    Gastrointestinal: Negative for nausea, diarrhea and constipation.  Genitourinary: Negative for urgency and frequency. neg for gyn c/o  Skin: Negative for pallor or rash   MSK pos for joint stiffness w/o swelling  Neurological: Negative for weakness, light-headedness, numbness  and headaches.  Hematological: Negative for adenopathy. Does not bruise/bleed easily.  Psychiatric/Behavioral: Negative  for dysphoric mood. The patient is not nervous/anxious.  pos for difficulty concentrating        Objective:   Physical Exam  Constitutional: She appears well-developed and well-nourished. No distress.  Well appearing   HENT:  Head: Normocephalic and atraumatic.  Right Ear: External ear normal.  Left Ear: External ear normal.  Mouth/Throat: Oropharynx is clear and moist.  Eyes: Conjunctivae and EOM are normal. Pupils are equal, round, and reactive to light. No scleral icterus.  Neck: Normal range of motion. Neck supple. No JVD present. Carotid bruit is not present. No thyromegaly present.  Cardiovascular: Normal rate, regular rhythm, normal heart sounds and intact distal pulses.  Exam reveals no gallop.   Pulmonary/Chest: Effort normal and breath sounds normal. No respiratory distress. She has no wheezes. She exhibits no tenderness.  Abdominal: Soft. Bowel sounds are normal. She exhibits no distension, no abdominal bruit and no mass. There is no tenderness.  Genitourinary: No breast swelling, tenderness, discharge or bleeding.  Genitourinary Comments: Breast exam: No mass, nodules, thickening, tenderness, bulging, retraction, inflamation, nipple discharge or skin changes noted.  No axillary or clavicular LA.      Musculoskeletal: Normal range of motion. She exhibits no edema or tenderness.  Lymphadenopathy:    She has no cervical adenopathy.  Neurological: She is alert. She has normal reflexes. No cranial nerve deficit. She exhibits normal muscle tone. Coordination normal.  Skin: Skin is warm and dry. No rash noted. No erythema. No pallor.  Solar lentigines diffusely  Psychiatric: She has a normal mood and affect.          Assessment & Plan:   Problem List Items Addressed This Visit      Other   Difficulty concentrating    Suspect due to menopause  Will inc  her lexapro to 34 and report back  May want eval for ADD in the future      Positive ANA (antinuclear antibody)    With Raynaud's dz/ and some joint c/o and dry mouth (? Sjogren's  May consider rheum ref- will let us know       Raynaud phenomenon    Now with ANA of 1:160 with homogenous pattern  ? If poss Sjogrens in addition to other problems She may req ref to rheumatology -will let us know        Routine general medical examination at a health care facility - Primary    Reviewed health habits including diet and exercise and skin cancer prevention Reviewed appropriate screening tests for age  Also reviewed health mt list, fam hx and immunization status , as well as social and family history   Declines HIV/Hep C screen due to low risk  Hx of hysterectomy Flu vaccine today  Hx of non fragility fx  Did adv to start ca and D Labs reviewed Good health habits        Symptoms, such as flushing, sleeplessness, headache, lack of concentration, associated with the menopause    Menopausal symptoms are bothersome Also problems concentrating  Not candidate for hrt with mother's hx of breast cancer  Will try inc lexapro from 10 to 20 mg for better mood/sleep/concentration and update  She is also trying estroven       Other Visit Diagnoses    Need for influenza vaccination       Relevant Orders   Flu Vaccine QUAD 36+ mos IM (Completed)

## 2017-01-26 NOTE — Patient Instructions (Addendum)
Try to get 1200-1500 mg of calcium per day with at least 1000 iu of vitamin D - for bone health   Flu shot today  Keep exercising  Increase your lexapro to 20 mg daily to see if it helps with your menopausal symptoms   Alert me if you want a rheumatology visit

## 2017-01-27 NOTE — Assessment & Plan Note (Signed)
Suspect due to menopause  Will inc her lexapro to 37 and report back  May want eval for ADD in the future

## 2017-01-27 NOTE — Assessment & Plan Note (Signed)
Reviewed health habits including diet and exercise and skin cancer prevention Reviewed appropriate screening tests for age  Also reviewed health mt list, fam hx and immunization status , as well as social and family history   Declines HIV/Hep C screen due to low risk  Hx of hysterectomy Flu vaccine today  Hx of non fragility fx  Did adv to start ca and D Labs reviewed Good health habits

## 2017-01-27 NOTE — Assessment & Plan Note (Signed)
Now with ANA of 1:160 with homogenous pattern  ? If poss Sjogrens in addition to other problems She may req ref to rheumatology -will let us know

## 2017-01-27 NOTE — Assessment & Plan Note (Signed)
With Raynaud's dz/ and some joint c/o and dry mouth (? Sjogren's  May consider rheum ref- will let us know

## 2017-01-27 NOTE — Assessment & Plan Note (Signed)
Menopausal symptoms are bothersome Also problems concentrating  Not candidate for hrt with mother's hx of breast cancer  Will try inc lexapro from 10 to 20 mg for better mood/sleep/concentration and update  She is also trying estroven

## 2017-01-31 ENCOUNTER — Encounter: Payer: Self-pay | Admitting: Family Medicine

## 2017-01-31 DIAGNOSIS — R768 Other specified abnormal immunological findings in serum: Secondary | ICD-10-CM

## 2017-01-31 NOTE — Telephone Encounter (Signed)
Ref to rheumatology Will route to [facility]

## 2017-05-04 ENCOUNTER — Encounter: Payer: Self-pay | Admitting: Family Medicine

## 2017-05-27 ENCOUNTER — Encounter: Payer: Self-pay | Admitting: Family Medicine

## 2017-05-27 DIAGNOSIS — R41840 Attention and concentration deficit: Secondary | ICD-10-CM

## 2017-12-23 DIAGNOSIS — N951 Menopausal and female climacteric states: Secondary | ICD-10-CM | POA: Diagnosis not present

## 2017-12-26 DIAGNOSIS — R4586 Emotional lability: Secondary | ICD-10-CM | POA: Diagnosis not present

## 2017-12-26 DIAGNOSIS — Z72820 Sleep deprivation: Secondary | ICD-10-CM | POA: Diagnosis not present

## 2017-12-26 DIAGNOSIS — R232 Flushing: Secondary | ICD-10-CM | POA: Diagnosis not present

## 2018-01-20 ENCOUNTER — Encounter: Payer: Self-pay | Admitting: Family Medicine

## 2018-01-20 DIAGNOSIS — Z780 Asymptomatic menopausal state: Secondary | ICD-10-CM | POA: Diagnosis not present

## 2018-01-20 DIAGNOSIS — Z13820 Encounter for screening for osteoporosis: Secondary | ICD-10-CM | POA: Diagnosis not present

## 2018-01-23 DIAGNOSIS — R5383 Other fatigue: Secondary | ICD-10-CM | POA: Diagnosis not present

## 2018-01-23 DIAGNOSIS — R6882 Decreased libido: Secondary | ICD-10-CM | POA: Diagnosis not present

## 2018-01-23 DIAGNOSIS — H5712 Ocular pain, left eye: Secondary | ICD-10-CM | POA: Diagnosis not present

## 2018-01-23 DIAGNOSIS — R4586 Emotional lability: Secondary | ICD-10-CM | POA: Diagnosis not present

## 2018-01-23 DIAGNOSIS — R232 Flushing: Secondary | ICD-10-CM | POA: Diagnosis not present

## 2018-01-25 DIAGNOSIS — R5383 Other fatigue: Secondary | ICD-10-CM | POA: Diagnosis not present

## 2018-01-25 DIAGNOSIS — N951 Menopausal and female climacteric states: Secondary | ICD-10-CM | POA: Diagnosis not present

## 2018-01-25 DIAGNOSIS — Z72820 Sleep deprivation: Secondary | ICD-10-CM | POA: Diagnosis not present

## 2018-01-25 DIAGNOSIS — R6882 Decreased libido: Secondary | ICD-10-CM | POA: Diagnosis not present

## 2018-02-23 ENCOUNTER — Encounter: Payer: Self-pay | Admitting: Family Medicine

## 2018-02-24 MED ORDER — BUPROPION HCL ER (XL) 150 MG PO TB24: ORAL_TABLET | ORAL | 1 refills | Status: DC

## 2018-02-24 NOTE — Telephone Encounter (Signed)
Copied from [place]. Topic: Quick Communication - Rx Refill/Question >> Feb 24, 2018  9:54 AM Lolita Rieger, RMA wrote: Medication: welllbutrin 150 mg   Has the patient contacted their pharmacy yes    (Agent: If no, request that the patient contact the pharmacy for the refill.)   Preferred Pharmacy (with phone number or street name): [facility] [place]: Please be advised that RX refills may take up to 3 business days. We ask that you follow-up with your pharmacy.

## 2018-02-24 NOTE — Telephone Encounter (Signed)
appt scheduled and med refilled 

## 2018-03-19 ENCOUNTER — Telehealth: Payer: Self-pay | Admitting: Family Medicine

## 2018-03-19 DIAGNOSIS — Z0000 Encounter for general adult medical examination without abnormal findings: Secondary | ICD-10-CM

## 2018-03-19 NOTE — Telephone Encounter (Signed)
-----   Message from Ellamae Sia sent at 03/16/2018  4:13 PM EDT ----- Regarding: Lab orders for Thursdday, 4.4.19 Patient is scheduled for CPX labs, please order future labs, Thanks , Karna Christmas

## 2018-03-23 ENCOUNTER — Other Ambulatory Visit (INDEPENDENT_AMBULATORY_CARE_PROVIDER_SITE_OTHER): Payer: BLUE CROSS/BLUE SHIELD

## 2018-03-23 DIAGNOSIS — Z0000 Encounter for general adult medical examination without abnormal findings: Secondary | ICD-10-CM

## 2018-03-23 LAB — COMPREHENSIVE METABOLIC PANEL WITH GFR
ALT: 14 U/L (ref 0–35)
AST: 18 U/L (ref 0–37)
Albumin: 3.8 g/dL (ref 3.5–5.2)
Alkaline Phosphatase: 61 U/L (ref 39–117)
BUN: 15 mg/dL (ref 6–23)
CO2: 27 meq/L (ref 19–32)
Calcium: 8.7 mg/dL (ref 8.4–10.5)
Chloride: 104 meq/L (ref 96–112)
Creatinine, Ser: 0.71 mg/dL (ref 0.40–1.20)
GFR: 91.67 mL/min
Glucose, Bld: 88 mg/dL (ref 70–99)
Potassium: 4.7 meq/L (ref 3.5–5.1)
Sodium: 137 meq/L (ref 135–145)
Total Bilirubin: 0.4 mg/dL (ref 0.2–1.2)
Total Protein: 6.6 g/dL (ref 6.0–8.3)

## 2018-03-23 LAB — CBC WITH DIFFERENTIAL/PLATELET
BASOS PCT: 1 % (ref 0.0–3.0)
Basophils Absolute: 0.1 10*3/uL (ref 0.0–0.1)
EOS ABS: 0.1 10*3/uL (ref 0.0–0.7)
Eosinophils Relative: 1.5 % (ref 0.0–5.0)
HEMATOCRIT: 38.9 % (ref 36.0–46.0)
Hemoglobin: 12.8 g/dL (ref 12.0–15.0)
LYMPHS ABS: 2.1 10*3/uL (ref 0.7–4.0)
Lymphocytes Relative: 40 % (ref 12.0–46.0)
MCHC: 33 g/dL (ref 30.0–36.0)
MCV: 88.7 fl (ref 78.0–100.0)
Monocytes Absolute: 0.4 10*3/uL (ref 0.1–1.0)
Monocytes Relative: 7.3 % (ref 3.0–12.0)
NEUTROS ABS: 2.7 10*3/uL (ref 1.4–7.7)
NEUTROS PCT: 50.2 % (ref 43.0–77.0)
PLATELETS: 298 10*3/uL (ref 150.0–400.0)
RBC: 4.39 Mil/uL (ref 3.87–5.11)
RDW: 14.5 % (ref 11.5–15.5)
WBC: 5.4 10*3/uL (ref 4.0–10.5)

## 2018-03-23 LAB — LIPID PANEL
CHOL/HDL RATIO: 2
Cholesterol: 211 mg/dL — ABNORMAL HIGH (ref 0–200)
HDL: 100.2 mg/dL (ref >39.00)
LDL CALC: 97 mg/dL (ref 0–99)
NONHDL: 110.43
Triglycerides: 68 mg/dL (ref 0.0–149.0)
VLDL: 13.6 mg/dL (ref 0.0–40.0)

## 2018-03-23 LAB — TSH: TSH: 2.17 u[IU]/mL (ref 0.35–4.50)

## 2018-03-28 ENCOUNTER — Encounter: Payer: Self-pay | Admitting: Family Medicine

## 2018-04-24 DIAGNOSIS — R6882 Decreased libido: Secondary | ICD-10-CM | POA: Diagnosis not present

## 2018-04-24 DIAGNOSIS — R5383 Other fatigue: Secondary | ICD-10-CM | POA: Diagnosis not present

## 2018-04-24 DIAGNOSIS — N951 Menopausal and female climacteric states: Secondary | ICD-10-CM | POA: Diagnosis not present

## 2018-04-24 DIAGNOSIS — Z72820 Sleep deprivation: Secondary | ICD-10-CM | POA: Diagnosis not present

## 2018-04-24 DIAGNOSIS — E039 Hypothyroidism, unspecified: Secondary | ICD-10-CM | POA: Diagnosis not present

## 2018-04-26 DIAGNOSIS — E039 Hypothyroidism, unspecified: Secondary | ICD-10-CM | POA: Diagnosis not present

## 2018-04-26 DIAGNOSIS — R6882 Decreased libido: Secondary | ICD-10-CM | POA: Diagnosis not present

## 2018-04-26 DIAGNOSIS — N951 Menopausal and female climacteric states: Secondary | ICD-10-CM | POA: Diagnosis not present

## 2018-04-26 DIAGNOSIS — R232 Flushing: Secondary | ICD-10-CM | POA: Diagnosis not present

## 2018-05-10 ENCOUNTER — Ambulatory Visit (INDEPENDENT_AMBULATORY_CARE_PROVIDER_SITE_OTHER): Payer: BLUE CROSS/BLUE SHIELD | Admitting: Family Medicine

## 2018-05-10 ENCOUNTER — Encounter: Payer: Self-pay | Admitting: Family Medicine

## 2018-05-10 VITALS — BP 108/64 | HR 71 | Temp 98.2°F | Ht 66.5 in | Wt 174.2 lb

## 2018-05-10 DIAGNOSIS — Z0000 Encounter for general adult medical examination without abnormal findings: Secondary | ICD-10-CM | POA: Diagnosis not present

## 2018-05-10 MED ORDER — BUPROPION HCL ER (XL) 150 MG PO TB24: ORAL_TABLET | ORAL | 11 refills | Status: DC

## 2018-05-10 NOTE — Patient Instructions (Addendum)
If you are interested in the new shingles vaccine (Shingrix) - call your local pharmacy to check on coverage and availability  If affordable - get on a wait list    Fit in exercise and self care when you can   Labs look good

## 2018-05-10 NOTE — Progress Notes (Signed)
Subjective:    Patient ID: Abigail Kramer, female    DOB: [birth year], 52 y.o.   MRN: [id number]  HPI Here for health maintenance exam and to review chronic medical problems    Staying busy  Work   Trying to take care of herself   Wt Readings from Last 3 Encounters:  05/10/18 174 lb 4 oz (79 kg)  01/26/17 155 lb (70.3 kg)  02/11/16 157 lb 8 oz (71.4 kg)  gained weight  Was stress eating  Went to a trainer in the past - had to stop due to job picking up  May be changing jobs in the future  Does eat a healthy diet / veg and protein 27.70 kg/m   Did start some walking  Does have a stand up desk  Sets alarms -take breaks and move  Has been going [facility] - has had testosterone   (she does see imp in energy and libido)  Aware of risks    Pap 1/09-then partial hyst with one tube removed   Mammogram 2/19  Nl  Self breast exam -no lumps  Mother with breast cancer   Flu shot 10/18  Colonoscopy 3/11 nl with 10 y recall   Tetanus shot 7/13   Zoster status -interested in shingrix     H/o raynauds and elevated ANA  Cholesterol   Lab Results  Component Value Date   CHOL 211 (H) 03/23/2018   CHOL 203 (H) 01/17/2017   CHOL 238 (H) 01/09/2016   Lab Results  Component Value Date   HDL 100.20 03/23/2018   HDL 82.50 01/17/2017   HDL 111 01/09/2016   Lab Results  Component Value Date   LDLCALC 97 03/23/2018   LDLCALC 94 01/17/2017   LDLCALC 105 01/09/2016   Lab Results  Component Value Date   TRIG 68.0 03/23/2018   TRIG 133.0 01/17/2017   TRIG 112 01/09/2016   Lab Results  Component Value Date   CHOLHDL 2 03/23/2018   CHOLHDL 2 01/17/2017   CHOLHDL 2.1 01/09/2016   Lab Results  Component Value Date   LDLDIRECT 99.3 12/18/2013   LDLDIRECT 90.3 01/17/2009  very good profile   Other labs Results for orders placed or performed in visit on 03/23/18  TSH  Result Value Ref Range   TSH 2.17 0.35 - 4.50 uIU/mL  Lipid panel  Result Value Ref Range   Cholesterol 211 (H) 0 - 200 mg/dL   Triglycerides 68.0 0.0 - 149.0 mg/dL   HDL 100.20 >39.00 mg/dL   VLDL 13.6 0.0 - 40.0 mg/dL   LDL Cholesterol 97 0 - 99 mg/dL   Total CHOL/HDL Ratio 2    NonHDL 110.43   Comprehensive metabolic panel  Result Value Ref Range   Sodium 137 135 - 145 mEq/L   Potassium 4.7 3.5 - 5.1 mEq/L   Chloride 104 96 - 112 mEq/L   CO2 27 19 - 32 mEq/L   Glucose, Bld 88 70 - 99 mg/dL   BUN 15 6 - 23 mg/dL   Creatinine, Ser 0.71 0.40 - 1.20 mg/dL   Total Bilirubin 0.4 0.2 - 1.2 mg/dL   Alkaline Phosphatase 61 39 - 117 U/L   AST 18 0 - 37 U/L   ALT 14 0 - 35 U/L   Total Protein 6.6 6.0 - 8.3 g/dL   Albumin 3.8 3.5 - 5.2 g/dL   Calcium 8.7 8.4 - 10.5 mg/dL   GFR 91.67 >60.00 mL/min  CBC with Differential/Platelet  Result  Value Ref Range   WBC 5.4 4.0 - 10.5 K/uL   RBC 4.39 3.87 - 5.11 Mil/uL   Hemoglobin 12.8 12.0 - 15.0 g/dL   HCT 38.9 36.0 - 46.0 %   MCV 88.7 78.0 - 100.0 fl   MCHC 33.0 30.0 - 36.0 g/dL   RDW 14.5 11.5 - 15.5 %   Platelets 298.0 150.0 - 400.0 K/uL   Neutrophils Relative % 50.2 43.0 - 77.0 %   Lymphocytes Relative 40.0 12.0 - 46.0 %   Monocytes Relative 7.3 3.0 - 12.0 %   Eosinophils Relative 1.5 0.0 - 5.0 %   Basophils Relative 1.0 0.0 - 3.0 %   Neutro Abs 2.7 1.4 - 7.7 K/uL   Lymphs Abs 2.1 0.7 - 4.0 K/uL   Monocytes Absolute 0.4 0.1 - 1.0 K/uL   Eosinophils Absolute 0.1 0.0 - 0.7 K/uL   Basophils Absolute 0.1 0.0 - 0.1 K/uL    Patient Active Problem List   Diagnosis Date Noted  . Positive ANA (antinuclear antibody) 01/26/2017  . Generalized anxiety disorder 10/22/2015  . Mouth ulcer 10/18/2014  . HSV (herpes simplex virus) infection 10/18/2014  . Raynaud phenomenon 10/18/2014  . Symptoms, such as flushing, sleeplessness, headache, lack of concentration, associated with the menopause 12/25/2013  . Routine general medical examination at a health care facility 04/08/2011   Past Medical History:  Diagnosis Date  . Fibroid  01/2009   small  . HSV infection    oral - treated by dentist   . Hx: UTI (urinary tract infection)   . Irregular menses   . Ovarian cyst 01/2009 and 04/2009   Past Surgical History:  Procedure Laterality Date  . PARTIAL HYSTERECTOMY  07/2009   1 tube removed  . TUBAL LIGATION     Social History   Tobacco Use  . Smoking status: Never Smoker  . Smokeless tobacco: Never Used  Substance Use Topics  . Alcohol use: No    Alcohol/week: 0.0 oz  . Drug use: No   Family History  Problem Relation Age of Onset  . Hypertension Mother   . Cancer Mother        breast  . Hypertension Brother   . Colon polyps Mother    Allergies  Allergen Reactions  . Prozac [Fluoxetine Hcl]     Sluggishness/dismotivation   Current Outpatient Medications on File Prior to Visit  Medication Sig Dispense Refill  . diphenhydramine-acetaminophen (TYLENOL PM) 25-500 MG TABS Take 1 tablet by mouth at bedtime as needed.      Marland Kitchen liothyronine (CYTOMEL) 5 MCG tablet Take 1 tablet by mouth daily.  1  . meclizine (ANTIVERT) 25 MG tablet Take 1 tablet (25 mg total) by mouth every 6 (six) hours as needed. 30 tablet 1  . Nutritional Supplements (ESTROVEN PO) Take 1 tablet by mouth daily.    . valACYclovir (VALTREX) 1000 MG tablet Take 1,000 mg by mouth daily.      No current facility-administered medications on file prior to visit.     Review of Systems  Constitutional: Positive for fatigue. Negative for activity change, appetite change, fever and unexpected weight change.  HENT: Negative for congestion, ear pain, rhinorrhea, sinus pressure and sore throat.   Eyes: Negative for pain, redness and visual disturbance.  Respiratory: Negative for cough, shortness of breath and wheezing.   Cardiovascular: Negative for chest pain and palpitations.  Gastrointestinal: Negative for abdominal pain, blood in stool, constipation and diarrhea.  Endocrine: Negative for polydipsia and polyuria.  Genitourinary:  Negative for  dysuria, frequency and urgency.       Low libido  Musculoskeletal: Negative for arthralgias, back pain and myalgias.  Skin: Negative for pallor and rash.  Allergic/Immunologic: Negative for environmental allergies.  Neurological: Negative for dizziness, syncope and headaches.  Hematological: Negative for adenopathy. Does not bruise/bleed easily.  Psychiatric/Behavioral: Negative for decreased concentration and dysphoric mood. The patient is not nervous/anxious.        Objective:   Physical Exam  Constitutional: She appears well-developed and well-nourished. No distress.  overwt and well app  HENT:  Head: Normocephalic and atraumatic.  Right Ear: External ear normal.  Left Ear: External ear normal.  Mouth/Throat: Oropharynx is clear and moist.  No mouth lesions   Eyes: Pupils are equal, round, and reactive to light. Conjunctivae and EOM are normal. No scleral icterus.  Neck: Normal range of motion. Neck supple. No JVD present. Carotid bruit is not present. No thyromegaly present.  Cardiovascular: Normal rate, regular rhythm, normal heart sounds and intact distal pulses. Exam reveals no gallop.  Pulmonary/Chest: Effort normal and breath sounds normal. No respiratory distress. She has no wheezes. She exhibits no tenderness. No breast tenderness, discharge or bleeding.  Abdominal: Soft. Bowel sounds are normal. She exhibits no distension, no abdominal bruit and no mass. There is no tenderness.  Genitourinary: No breast tenderness, discharge or bleeding.  Genitourinary Comments: Breast exam: No mass, nodules, thickening, tenderness, bulging, retraction, inflamation, nipple discharge or skin changes noted.  No axillary or clavicular LA.      Musculoskeletal: Normal range of motion. She exhibits no edema or tenderness.  No kyphosis   Lymphadenopathy:    She has no cervical adenopathy.  Neurological: She is alert. She has normal reflexes. No cranial nerve deficit. She exhibits normal muscle  tone. Coordination normal.  Skin: Skin is warm and dry. No rash noted. No erythema. No pallor.  Tanned Solar lentigines diffusely Few sks   Psychiatric: She has a normal mood and affect.  Mood is good          Assessment & Plan:   Problem List Items Addressed This Visit      Other   Routine general medical examination at a health care facility - Primary    Reviewed health habits including diet and exercise and skin cancer prevention Reviewed appropriate screening tests for age  Also reviewed health mt list, fam hx and immunization status , as well as social and family history   See HPI  Labs rev  Good cholesterol  Enc exercise when able  Pt takes testosterone/bio id hormones from [facility]-she is aware of potential risks of it  Also takes cytomel  Goes to tanning bed- disc risks of skin cancer with this  Also continues wellbutrin

## 2018-05-10 NOTE — Assessment & Plan Note (Signed)
Reviewed health habits including diet and exercise and skin cancer prevention Reviewed appropriate screening tests for age  Also reviewed health mt list, fam hx and immunization status , as well as social and family history   See HPI  Labs rev  Good cholesterol  Enc exercise when able  Pt takes testosterone/bio id hormones from [facility] clinic-she is aware of potential risks of it  Also takes cytomel  Goes to tanning bed- disc risks of skin cancer with this  Also continues wellbutrin

## 2018-08-22 DIAGNOSIS — R6882 Decreased libido: Secondary | ICD-10-CM | POA: Diagnosis not present

## 2018-08-22 DIAGNOSIS — N951 Menopausal and female climacteric states: Secondary | ICD-10-CM | POA: Diagnosis not present

## 2018-08-22 DIAGNOSIS — E039 Hypothyroidism, unspecified: Secondary | ICD-10-CM | POA: Diagnosis not present

## 2018-08-22 DIAGNOSIS — R232 Flushing: Secondary | ICD-10-CM | POA: Diagnosis not present

## 2018-08-23 DIAGNOSIS — R6882 Decreased libido: Secondary | ICD-10-CM | POA: Diagnosis not present

## 2018-08-23 DIAGNOSIS — R232 Flushing: Secondary | ICD-10-CM | POA: Diagnosis not present

## 2018-08-23 DIAGNOSIS — N951 Menopausal and female climacteric states: Secondary | ICD-10-CM | POA: Diagnosis not present

## 2018-08-23 DIAGNOSIS — Z72820 Sleep deprivation: Secondary | ICD-10-CM | POA: Diagnosis not present

## 2018-11-06 DIAGNOSIS — E039 Hypothyroidism, unspecified: Secondary | ICD-10-CM | POA: Diagnosis not present

## 2018-11-06 DIAGNOSIS — N951 Menopausal and female climacteric states: Secondary | ICD-10-CM | POA: Diagnosis not present

## 2018-11-08 DIAGNOSIS — Z72820 Sleep deprivation: Secondary | ICD-10-CM | POA: Diagnosis not present

## 2018-11-08 DIAGNOSIS — R232 Flushing: Secondary | ICD-10-CM | POA: Diagnosis not present

## 2018-11-08 DIAGNOSIS — N951 Menopausal and female climacteric states: Secondary | ICD-10-CM | POA: Diagnosis not present

## 2018-11-08 DIAGNOSIS — R4586 Emotional lability: Secondary | ICD-10-CM | POA: Diagnosis not present

## 2019-02-01 ENCOUNTER — Encounter: Payer: Self-pay | Admitting: Family Medicine

## 2019-02-01 DIAGNOSIS — Z1231 Encounter for screening mammogram for malignant neoplasm of breast: Secondary | ICD-10-CM | POA: Diagnosis not present

## 2019-02-01 DIAGNOSIS — Z803 Family history of malignant neoplasm of breast: Secondary | ICD-10-CM | POA: Diagnosis not present

## 2019-02-27 DIAGNOSIS — H2513 Age-related nuclear cataract, bilateral: Secondary | ICD-10-CM | POA: Diagnosis not present

## 2019-02-27 DIAGNOSIS — H18413 Arcus senilis, bilateral: Secondary | ICD-10-CM | POA: Diagnosis not present

## 2019-02-27 DIAGNOSIS — H02831 Dermatochalasis of right upper eyelid: Secondary | ICD-10-CM | POA: Diagnosis not present

## 2019-02-27 DIAGNOSIS — H25013 Cortical age-related cataract, bilateral: Secondary | ICD-10-CM | POA: Diagnosis not present

## 2019-02-27 DIAGNOSIS — H2511 Age-related nuclear cataract, right eye: Secondary | ICD-10-CM | POA: Diagnosis not present

## 2019-04-10 DIAGNOSIS — R232 Flushing: Secondary | ICD-10-CM | POA: Diagnosis not present

## 2019-04-10 DIAGNOSIS — N951 Menopausal and female climacteric states: Secondary | ICD-10-CM | POA: Diagnosis not present

## 2019-04-10 DIAGNOSIS — Z72820 Sleep deprivation: Secondary | ICD-10-CM | POA: Diagnosis not present

## 2019-04-10 DIAGNOSIS — E039 Hypothyroidism, unspecified: Secondary | ICD-10-CM | POA: Diagnosis not present

## 2019-04-13 DIAGNOSIS — N951 Menopausal and female climacteric states: Secondary | ICD-10-CM | POA: Diagnosis not present

## 2019-04-13 DIAGNOSIS — R4586 Emotional lability: Secondary | ICD-10-CM | POA: Diagnosis not present

## 2019-04-13 DIAGNOSIS — R232 Flushing: Secondary | ICD-10-CM | POA: Diagnosis not present

## 2019-05-09 ENCOUNTER — Other Ambulatory Visit (INDEPENDENT_AMBULATORY_CARE_PROVIDER_SITE_OTHER): Payer: BLUE CROSS/BLUE SHIELD

## 2019-05-09 ENCOUNTER — Telehealth: Payer: Self-pay | Admitting: Family Medicine

## 2019-05-09 ENCOUNTER — Other Ambulatory Visit: Payer: BLUE CROSS/BLUE SHIELD

## 2019-05-09 DIAGNOSIS — Z0000 Encounter for general adult medical examination without abnormal findings: Secondary | ICD-10-CM

## 2019-05-09 LAB — CBC WITH DIFFERENTIAL/PLATELET
Basophils Absolute: 0 10*3/uL (ref 0.0–0.1)
Basophils Relative: 0.6 % (ref 0.0–3.0)
Eosinophils Absolute: 0.1 10*3/uL (ref 0.0–0.7)
Eosinophils Relative: 1.4 % (ref 0.0–5.0)
HCT: 39.8 % (ref 36.0–46.0)
Hemoglobin: 13.3 g/dL (ref 12.0–15.0)
Lymphocytes Relative: 37.3 % (ref 12.0–46.0)
Lymphs Abs: 2.2 10*3/uL (ref 0.7–4.0)
MCHC: 33.5 g/dL (ref 30.0–36.0)
MCV: 89 fl (ref 78.0–100.0)
Monocytes Absolute: 0.4 10*3/uL (ref 0.1–1.0)
Monocytes Relative: 6.2 % (ref 3.0–12.0)
Neutro Abs: 3.3 10*3/uL (ref 1.4–7.7)
Neutrophils Relative %: 54.5 % (ref 43.0–77.0)
Platelets: 300 10*3/uL (ref 150.0–400.0)
RBC: 4.47 Mil/uL (ref 3.87–5.11)
RDW: 14.2 % (ref 11.5–15.5)
WBC: 6 10*3/uL (ref 4.0–10.5)

## 2019-05-09 LAB — COMPREHENSIVE METABOLIC PANEL
ALT: 14 U/L (ref 0–35)
AST: 16 U/L (ref 0–37)
Albumin: 4.2 g/dL (ref 3.5–5.2)
Alkaline Phosphatase: 74 U/L (ref 39–117)
BUN: 13 mg/dL (ref 6–23)
CO2: 28 mEq/L (ref 19–32)
Calcium: 8.7 mg/dL (ref 8.4–10.5)
Chloride: 103 mEq/L (ref 96–112)
Creatinine, Ser: 0.76 mg/dL (ref 0.40–1.20)
GFR: 79.4 mL/min (ref >60.00)
Glucose, Bld: 97 mg/dL (ref 70–99)
Potassium: 4.9 mEq/L (ref 3.5–5.1)
Sodium: 137 mEq/L (ref 135–145)
Total Bilirubin: 0.5 mg/dL (ref 0.2–1.2)
Total Protein: 6.7 g/dL (ref 6.0–8.3)

## 2019-05-09 LAB — LIPID PANEL
Cholesterol: 238 mg/dL — ABNORMAL HIGH (ref 0–200)
HDL: 102 mg/dL (ref >39.00)
LDL Cholesterol: 115 mg/dL — ABNORMAL HIGH (ref 0–99)
NonHDL: 136.24
Total CHOL/HDL Ratio: 2
Triglycerides: 107 mg/dL (ref 0.0–149.0)
VLDL: 21.4 mg/dL (ref 0.0–40.0)

## 2019-05-09 LAB — TSH: TSH: 2.21 u[IU]/mL (ref 0.35–4.50)

## 2019-05-09 NOTE — Telephone Encounter (Signed)
-----   Message from Ellamae Sia sent at 05/09/2019  7:54 AM EDT ----- Regarding: lab orders for today Lab orders

## 2019-05-16 ENCOUNTER — Encounter: Payer: Self-pay | Admitting: Family Medicine

## 2019-05-16 ENCOUNTER — Other Ambulatory Visit: Payer: Self-pay

## 2019-05-16 ENCOUNTER — Ambulatory Visit (INDEPENDENT_AMBULATORY_CARE_PROVIDER_SITE_OTHER): Payer: BLUE CROSS/BLUE SHIELD | Admitting: Family Medicine

## 2019-05-16 VITALS — BP 130/68 | HR 94 | Temp 98.2°F | Ht 66.75 in | Wt 177.2 lb

## 2019-05-16 DIAGNOSIS — Z0000 Encounter for general adult medical examination without abnormal findings: Secondary | ICD-10-CM | POA: Diagnosis not present

## 2019-05-16 DIAGNOSIS — N951 Menopausal and female climacteric states: Secondary | ICD-10-CM

## 2019-05-16 DIAGNOSIS — F411 Generalized anxiety disorder: Secondary | ICD-10-CM

## 2019-05-16 MED ORDER — BUPROPION HCL ER (XL) 150 MG PO TB24: ORAL_TABLET | ORAL | 11 refills | Status: DC

## 2019-05-16 NOTE — Assessment & Plan Note (Signed)
More anxiety during the pandemic -but not treated Declines need for counseling Reviewed stressors/ coping techniques/symptoms/ support sources/ tx options and side effects in detail today Disc benefits of exercise and meditation  She continues wellbutrin for depression

## 2019-05-16 NOTE — Patient Instructions (Addendum)
If you are interested in the new shingles vaccine (Shingrix) - call your local pharmacy to check on coverage and availability  If affordable, let us know and we can work on ordering   Meditation apps like Calm and Headspace are very helpful  Use on your phone or pad- headphones  Take a look at them and see what you think   Exercise will help you feel better - work on scheduling that   Take care of yourself

## 2019-05-16 NOTE — Progress Notes (Signed)
Subjective:    Patient ID: Abigail Kramer, female    DOB: 1966-12-07, 53 y.o.   MRN: [id number]  HPI Here for health maintenance exam and to review chronic medical problems    She has been fairly healthy  Moved her parents from [place] in the middle of the pandemic -in their 31s  She has been caring for them  This makes her anxious   Working at home- that will be permanent She loves it   She is not exercising like she was  Eating healthy    Weight  Wt Readings from Last 3 Encounters:  05/16/19 177 lb 3 oz (80.4 kg)  05/10/18 174 lb 4 oz (79 kg)  01/26/17 155 lb (70.3 kg)  has a total gym at home- needs to get back on it Needs to motivate herself  Gets outdoors  27.96 kg/m   BP Readings from Last 3 Encounters:  05/16/19 130/68  05/10/18 108/64  01/26/17 130/80   Pulse Readings from Last 3 Encounters:  05/16/19 94  05/10/18 71  01/26/17 76   Has cataract surgery this week   Pap 1/09  Then partial hysterectomy and one tube removed  No gyn c/o   Flu shot 9/19 Tetanus shot 7/13  Mammogram 2/20  Self breast exam -no lumps  Mother had breast cancer  Also a cousin   Colonoscopy 3/11   Zoster status -interested in vaccine   PHQ -2 Depression screen [facility] 2/9 05/16/2019 05/10/2018  Decreased Interest 0 1  Down, Depressed, Hopeless 0 0  PHQ - 2 Score 0 1  Altered sleeping - 1  Tired, decreased energy - 1  Change in appetite - 0  Feeling bad or failure about yourself  - 0  Trouble concentrating - 1  Moving slowly or fidgety/restless - 0  Suicidal thoughts - 0  PHQ-9 Score - 4     H/o raynaud's phenomenon-not as bad recently  Has phases on and off   Also menopausal symptoms = ok/ still doing the [facility] tx Helps a lot   Cholesterol  Lab Results  Component Value Date   CHOL 238 (H) 05/09/2019   CHOL 211 (H) 03/23/2018   CHOL 203 (H) 01/17/2017   Lab Results  Component Value Date   HDL 102.00 05/09/2019   HDL 100.20 03/23/2018   HDL 82.50  01/17/2017   Lab Results  Component Value Date   LDLCALC 115 (H) 05/09/2019   LDLCALC 97 03/23/2018   LDLCALC 94 01/17/2017   Lab Results  Component Value Date   TRIG 107.0 05/09/2019   TRIG 68.0 03/23/2018   TRIG 133.0 01/17/2017   Lab Results  Component Value Date   CHOLHDL 2 05/09/2019   CHOLHDL 2 03/23/2018   CHOLHDL 2 01/17/2017   Lab Results  Component Value Date   LDLDIRECT 99.3 12/18/2013   LDLDIRECT 90.3 01/17/2009   Diet - has not changed / eats quite healthy  Parents have mild high cholesterol    Other labs: Results for orders placed or performed in visit on 05/09/19  TSH  Result Value Ref Range   TSH 2.21 0.35 - 4.50 uIU/mL  Lipid panel  Result Value Ref Range   Cholesterol 238 (H) 0 - 200 mg/dL   Triglycerides 107.0 0.0 - 149.0 mg/dL   HDL 102.00 >39.00 mg/dL   VLDL 21.4 0.0 - 40.0 mg/dL   LDL Cholesterol 115 (H) 0 - 99 mg/dL   Total CHOL/HDL Ratio 2    NonHDL 136.24  Comprehensive metabolic panel  Result Value Ref Range   Sodium 137 135 - 145 mEq/L   Potassium 4.9 3.5 - 5.1 mEq/L   Chloride 103 96 - 112 mEq/L   CO2 28 19 - 32 mEq/L   Glucose, Bld 97 70 - 99 mg/dL   BUN 13 6 - 23 mg/dL   Creatinine, Ser 0.76 0.40 - 1.20 mg/dL   Total Bilirubin 0.5 0.2 - 1.2 mg/dL   Alkaline Phosphatase 74 39 - 117 U/L   AST 16 0 - 37 U/L   ALT 14 0 - 35 U/L   Total Protein 6.7 6.0 - 8.3 g/dL   Albumin 4.2 3.5 - 5.2 g/dL   Calcium 8.7 8.4 - 10.5 mg/dL   GFR 79.40 >60.00 mL/min  CBC with Differential/Platelet  Result Value Ref Range   WBC 6.0 4.0 - 10.5 K/uL   RBC 4.47 3.87 - 5.11 Mil/uL   Hemoglobin 13.3 12.0 - 15.0 g/dL   HCT 39.8 36.0 - 46.0 %   MCV 89.0 78.0 - 100.0 fl   MCHC 33.5 30.0 - 36.0 g/dL   RDW 14.2 11.5 - 15.5 %   Platelets 300.0 150.0 - 400.0 K/uL   Neutrophils Relative % 54.5 43.0 - 77.0 %   Lymphocytes Relative 37.3 12.0 - 46.0 %   Monocytes Relative 6.2 3.0 - 12.0 %   Eosinophils Relative 1.4 0.0 - 5.0 %   Basophils Relative 0.6  0.0 - 3.0 %   Neutro Abs 3.3 1.4 - 7.7 K/uL   Lymphs Abs 2.2 0.7 - 4.0 K/uL   Monocytes Absolute 0.4 0.1 - 1.0 K/uL   Eosinophils Absolute 0.1 0.0 - 0.7 K/uL   Basophils Absolute 0.0 0.0 - 0.1 K/uL    Taking wellbutrin  It helps her mood - keeps her from getting down  Wants to continue it   No longer goes to a tanner  Is outside fair amt  Will wear sunscreen  Had derm visit a year ago   Patient Active Problem List   Diagnosis Date Noted  . Positive ANA (antinuclear antibody) 01/26/2017  . Generalized anxiety disorder 10/22/2015  . Mouth ulcer 10/18/2014  . HSV (herpes simplex virus) infection 10/18/2014  . Raynaud phenomenon 10/18/2014  . Symptoms, such as flushing, sleeplessness, headache, lack of concentration, associated with the menopause 12/25/2013  . Routine general medical examination at a health care facility 04/08/2011   Past Medical History:  Diagnosis Date  . Fibroid 01/2009   small  . HSV infection    oral - treated by dentist   . Hx: UTI (urinary tract infection)   . Irregular menses   . Ovarian cyst 01/2009 and 04/2009   Past Surgical History:  Procedure Laterality Date  . PARTIAL HYSTERECTOMY  07/2009   1 tube removed  . TUBAL LIGATION     Social History   Tobacco Use  . Smoking status: Never Smoker  . Smokeless tobacco: Never Used  Substance Use Topics  . Alcohol use: No    Alcohol/week: 0.0 standard drinks  . Drug use: No   Family History  Problem Relation Age of Onset  . Hypertension Mother   . Cancer Mother        breast  . Hypertension Brother   . Colon polyps Mother    Allergies  Allergen Reactions  . Prozac [Fluoxetine Hcl]     Sluggishness/dismotivation   Current Outpatient Medications on File Prior to Visit  Medication Sig Dispense Refill  . valACYclovir (VALTREX) 1000  MG tablet Take 1,000 mg by mouth daily.      No current facility-administered medications on file prior to visit.     Review of Systems  Constitutional:  Negative for activity change, appetite change, fatigue, fever and unexpected weight change.  HENT: Negative for congestion, ear pain, rhinorrhea, sinus pressure and sore throat.   Eyes: Negative for pain, redness and visual disturbance.  Respiratory: Negative for cough, shortness of breath and wheezing.   Cardiovascular: Negative for chest pain, palpitations and leg swelling.       Raynauds-cold fingers/toes intermittently  Gastrointestinal: Negative for abdominal pain, blood in stool, constipation and diarrhea.  Endocrine: Negative for polydipsia and polyuria.  Genitourinary: Negative for dysuria, frequency and urgency.  Musculoskeletal: Negative for arthralgias, back pain and myalgias.  Skin: Negative for pallor and rash.  Allergic/Immunologic: Negative for environmental allergies.  Neurological: Negative for dizziness, syncope and headaches.  Hematological: Negative for adenopathy. Does not bruise/bleed easily.  Psychiatric/Behavioral: Negative for decreased concentration and dysphoric mood. The patient is not nervous/anxious.        Mood stable with wellbutrin        Objective:   Physical Exam Constitutional:      General: She is not in acute distress.    Appearance: Normal appearance. She is well-developed and normal weight. She is not ill-appearing or diaphoretic.  HENT:     Head: Normocephalic and atraumatic.     Right Ear: Tympanic membrane, ear canal and external ear normal.     Left Ear: Tympanic membrane, ear canal and external ear normal.     Nose: Nose normal.     Mouth/Throat:     Mouth: Mucous membranes are moist.     Pharynx: Oropharynx is clear. No posterior oropharyngeal erythema.  Eyes:     General: No scleral icterus.    Conjunctiva/sclera: Conjunctivae normal.     Pupils: Pupils are equal, round, and reactive to light.  Neck:     Musculoskeletal: Normal range of motion and neck supple.     Thyroid: No thyromegaly.     Vascular: No carotid bruit or JVD.   Cardiovascular:     Rate and Rhythm: Regular rhythm. Tachycardia present.     Heart sounds: Normal heart sounds. No gallop.   Pulmonary:     Effort: Pulmonary effort is normal. No respiratory distress.     Breath sounds: Normal breath sounds. No wheezing.  Chest:     Chest wall: No tenderness.  Abdominal:     General: Bowel sounds are normal. There is no distension or abdominal bruit.     Palpations: Abdomen is soft. There is no mass.     Tenderness: There is no abdominal tenderness.     Hernia: No hernia is present.  Genitourinary:    Comments: Breast exam: No mass, nodules, thickening, tenderness, bulging, retraction, inflamation, nipple discharge or skin changes noted.  No axillary or clavicular LA.     Musculoskeletal: Normal range of motion.        General: No tenderness.  Lymphadenopathy:     Cervical: No cervical adenopathy.  Skin:    General: Skin is warm and dry.     Capillary Refill: Capillary refill takes less than 2 seconds.     Coloration: Skin is not pale.     Findings: No erythema or rash.     Comments: Solar lentigines diffusely Tanned   Neurological:     Mental Status: She is alert.     Cranial Nerves: No cranial  nerve deficit.     Motor: No abnormal muscle tone.     Coordination: Coordination normal.     Gait: Gait normal.     Deep Tendon Reflexes: Reflexes are normal and symmetric. Reflexes normal.  Psychiatric:        Mood and Affect: Mood normal.           Assessment & Plan:   Problem List Items Addressed This Visit      Other   Routine general medical examination at a health care facility - Primary    Reviewed health habits including diet and exercise and skin cancer prevention Reviewed appropriate screening tests for age  Also reviewed health mt list, fam hx and immunization status , as well as social and family history   See HPI Labs reviewed  Disc risks of HRT ([facility] program)-she voices understanding Disc zoster vaccine- plans to get  if affordable  Also plan made for regular exercise       Symptoms, such as flushing, sleeplessness, headache, lack of concentration, associated with the menopause    Pt continues bio identical hormones from another practice Voices understanding of their risks      Generalized anxiety disorder    More anxiety during the pandemic -but not treated Declines need for counseling Reviewed stressors/ coping techniques/symptoms/ support sources/ tx options and side effects in detail today Disc benefits of exercise and meditation  She continues wellbutrin for depression       Relevant Medications   buPROPion (WELLBUTRIN XL) 150 MG 24 hr tablet

## 2019-05-16 NOTE — Assessment & Plan Note (Signed)
Reviewed health habits including diet and exercise and skin cancer prevention Reviewed appropriate screening tests for age  Also reviewed health mt list, fam hx and immunization status , as well as social and family history   See HPI Labs reviewed  Disc risks of HRT ([facility] program)-she voices understanding Disc zoster vaccine- plans to get if affordable  Also plan made for regular exercise

## 2019-05-16 NOTE — Assessment & Plan Note (Signed)
Pt continues bio identical hormones from another practice Voices understanding of their risks

## 2019-05-28 DIAGNOSIS — H25041 Posterior subcapsular polar age-related cataract, right eye: Secondary | ICD-10-CM | POA: Diagnosis not present

## 2019-05-28 DIAGNOSIS — H2511 Age-related nuclear cataract, right eye: Secondary | ICD-10-CM | POA: Diagnosis not present

## 2019-05-29 DIAGNOSIS — H2512 Age-related nuclear cataract, left eye: Secondary | ICD-10-CM | POA: Diagnosis not present

## 2019-06-11 DIAGNOSIS — H2512 Age-related nuclear cataract, left eye: Secondary | ICD-10-CM | POA: Diagnosis not present

## 2019-06-11 DIAGNOSIS — H52202 Unspecified astigmatism, left eye: Secondary | ICD-10-CM | POA: Diagnosis not present

## 2019-07-11 DIAGNOSIS — E039 Hypothyroidism, unspecified: Secondary | ICD-10-CM | POA: Diagnosis not present

## 2019-07-11 DIAGNOSIS — N951 Menopausal and female climacteric states: Secondary | ICD-10-CM | POA: Diagnosis not present

## 2019-07-16 DIAGNOSIS — R232 Flushing: Secondary | ICD-10-CM | POA: Diagnosis not present

## 2019-07-16 DIAGNOSIS — Z72820 Sleep deprivation: Secondary | ICD-10-CM | POA: Diagnosis not present

## 2019-07-16 DIAGNOSIS — N951 Menopausal and female climacteric states: Secondary | ICD-10-CM | POA: Diagnosis not present

## 2019-10-01 DIAGNOSIS — R232 Flushing: Secondary | ICD-10-CM | POA: Diagnosis not present

## 2019-10-01 DIAGNOSIS — Z72820 Sleep deprivation: Secondary | ICD-10-CM | POA: Diagnosis not present

## 2019-10-01 DIAGNOSIS — N951 Menopausal and female climacteric states: Secondary | ICD-10-CM | POA: Diagnosis not present

## 2019-10-04 DIAGNOSIS — N951 Menopausal and female climacteric states: Secondary | ICD-10-CM | POA: Diagnosis not present

## 2019-10-04 DIAGNOSIS — Z72820 Sleep deprivation: Secondary | ICD-10-CM | POA: Diagnosis not present

## 2019-10-04 DIAGNOSIS — R232 Flushing: Secondary | ICD-10-CM | POA: Diagnosis not present

## 2019-11-13 DIAGNOSIS — R509 Fever, unspecified: Secondary | ICD-10-CM | POA: Diagnosis not present

## 2019-11-13 DIAGNOSIS — R05 Cough: Secondary | ICD-10-CM | POA: Diagnosis not present

## 2019-11-13 DIAGNOSIS — U071 COVID-19: Secondary | ICD-10-CM | POA: Diagnosis not present

## 2019-11-13 DIAGNOSIS — R0789 Other chest pain: Secondary | ICD-10-CM | POA: Diagnosis not present

## 2019-11-21 ENCOUNTER — Telehealth: Payer: Self-pay | Admitting: *Deleted

## 2019-11-21 NOTE — Telephone Encounter (Signed)
Pt notified of Dr. Marliss Coots comments and doxy appt scheduled for tomorrow

## 2019-11-21 NOTE — Telephone Encounter (Signed)
I reviewed her UC notes-re assuring  Please schedule a virtual visit thurs or fri so I can check in with her  Thanks

## 2019-11-21 NOTE — Telephone Encounter (Signed)
Patient called stating that she is not good and had a bad night last night. Patient stated that she had been running a fever until today. Patient stated that she has a tightness in her chest that comes and goes and bothers her more at certain times. Patient stated that her cough is worse today. Patient had some difficulty breathing last night. ER precautions given to patient. Pharmacy [facility]

## 2019-11-21 NOTE — Telephone Encounter (Signed)
Dr. Glori Bickers received an OV note from an UC saying pt tested positive for covid, per note Dr. Glori Bickers wanted an update on pt. Called pt and no answer so left VM requesting pt to call back and update [place] on how she is feeling

## 2019-11-22 ENCOUNTER — Encounter: Payer: Self-pay | Admitting: Family Medicine

## 2019-11-22 ENCOUNTER — Other Ambulatory Visit: Payer: Self-pay

## 2019-11-22 ENCOUNTER — Ambulatory Visit (INDEPENDENT_AMBULATORY_CARE_PROVIDER_SITE_OTHER): Payer: BC Managed Care – PPO | Admitting: Family Medicine

## 2019-11-22 DIAGNOSIS — U071 COVID-19: Secondary | ICD-10-CM | POA: Diagnosis not present

## 2019-11-22 MED ORDER — PREDNISONE 10 MG PO TABS: ORAL_TABLET | ORAL | 0 refills | Status: DC

## 2019-11-22 MED ORDER — ALBUTEROL SULFATE HFA 108 (90 BASE) MCG/ACT IN AERS: 2.0000 | INHALATION_SPRAY | RESPIRATORY_TRACT | 1 refills | Status: DC | PRN

## 2019-11-22 NOTE — Progress Notes (Signed)
Virtual Visit via Video Note  I connected with Abigail Kramer on 11/22/19 at  9:00 AM EST by a video enabled telemedicine application and verified that I am speaking with the correct person using two identifiers.  Location: Patient: home Provider: office    I discussed the limitations of evaluation and management by telemedicine and the availability of in person appointments. The patient expressed understanding and agreed to proceed.  Parties involved in encounter: Patient : Abigail Kramer Treating physician Loura Pardon MD   History of Present Illness: Pt presents with symptomatic covid -19   She was diagnosed at urgent care visit on 11/24 Had w/u incl chest xray   Doing a little better today   Still has chest tightness  Dry cough- worse when she moves around  No sputum production   (sounded a little more congested this am)   One night it hurt to breathe- that made her anxious  She has a pulse ox - all her 02 levels are 90 or above   Very mild headache   No sinus problems at all  Not congestion   Lost smell/not taste   Last temp was low grade yesterday   Takes tylenol otc    Interested in inhaler  Also prednisone   She tried to work from home a little - she was too tired  Works from home  Work place is understand   Patient Active Problem List   Diagnosis Date Noted  . COVID-19 virus infection 11/22/2019  . Positive ANA (antinuclear antibody) 01/26/2017  . Generalized anxiety disorder 10/22/2015  . Mouth ulcer 10/18/2014  . HSV (herpes simplex virus) infection 10/18/2014  . Raynaud phenomenon 10/18/2014  . Symptoms, such as flushing, sleeplessness, headache, lack of concentration, associated with the menopause 12/25/2013  . Routine general medical examination at a health care facility 04/08/2011   Past Medical History:  Diagnosis Date  . Fibroid 01/2009   small  . HSV infection    oral - treated by dentist   . Hx: UTI (urinary tract infection)   .  Irregular menses   . Ovarian cyst 01/2009 and 04/2009   Past Surgical History:  Procedure Laterality Date  . PARTIAL HYSTERECTOMY  07/2009   1 tube removed  . TUBAL LIGATION     Social History   Tobacco Use  . Smoking status: Never Smoker  . Smokeless tobacco: Never Used  Substance Use Topics  . Alcohol use: No    Alcohol/week: 0.0 standard drinks  . Drug use: No   Family History  Problem Relation Age of Onset  . Hypertension Mother   . Cancer Mother        breast  . Hypertension Brother   . Colon polyps Mother    Allergies  Allergen Reactions  . Prozac [Fluoxetine Hcl]     Sluggishness/dismotivation   Current Outpatient Medications on File Prior to Visit  Medication Sig Dispense Refill  . buPROPion (WELLBUTRIN XL) 150 MG 24 hr tablet TAKE 1 TABLET (150 MG TOTAL) BY MOUTH DAILY. 30 tablet 11  . valACYclovir (VALTREX) 1000 MG tablet Take 1,000 mg by mouth daily.      No current facility-administered medications on file prior to visit.    Review of Systems  Constitutional: Positive for fever and malaise/fatigue. Negative for chills.  HENT: Negative for congestion, ear pain, sinus pain and sore throat.   Eyes: Negative for blurred vision, discharge and redness.  Respiratory: Positive for cough and wheezing. Negative for sputum production,  shortness of breath and stridor.   Cardiovascular: Negative for chest pain, palpitations and leg swelling.  Gastrointestinal: Negative for abdominal pain, diarrhea, nausea and vomiting.  Musculoskeletal: Negative for myalgias.  Skin: Negative for rash.  Neurological: Positive for headaches. Negative for dizziness.    Observations/Objective: Patient appears well, in no distress  (just fatigued)  Weight is baseline  No facial swelling or asymmetry Normal voice-not hoarse and no slurred speech No obvious tremor or mobility impairment Moving neck and UEs normally Able to hear the call well  No cough or shortness of breath during  interview  Talkative and mentally sharp with no cognitive changes No skin changes on face or neck , no rash or pallor Affect is normal    Assessment and Plan: Problem List Items Addressed This Visit      Other   COVID-19 virus infection    Diagnosed from UC on 11/24 Had neg cxr at that time Recovering at home (husband has it also)  Chest tightness is bothersome- albuterol and low dose prednisone taper sent to pharmacy  Continues to work at home and isolate  Fluids and rest enc  Update if not starting to improve in a week or if worsening    (esp if return of fever or prod cough)            Follow Up Instructions: Take the prednisone as directed to help with chest/breathing tightness The inhaler for wheezing if needed Drink fluids and rest  Treat fever if needed  Update if worse or new symptoms   Continue to isolate until symptoms are better    I discussed the assessment and treatment plan with the patient. The patient was provided an opportunity to ask questions and all were answered. The patient agreed with the plan and demonstrated an understanding of the instructions.   The patient was advised to call back or seek an in-person evaluation if the symptoms worsen or if the condition fails to improve as anticipated.     Loura Pardon, MD

## 2019-11-22 NOTE — Assessment & Plan Note (Signed)
Diagnosed from [facility] on 11/24 Had neg cxr at that time Recovering at home (husband has it also)  Chest tightness is bothersome- albuterol and low dose prednisone taper sent to pharmacy  Continues to work at home and isolate  Fluids and rest enc  Update if not starting to improve in a week or if worsening    (esp if return of fever or prod cough)

## 2019-11-22 NOTE — Patient Instructions (Signed)
Take the prednisone as directed to help with chest/breathing tightness The inhaler for wheezing if needed Drink fluids and rest  Treat fever if needed  Update if worse or new symptoms   Continue to isolate until symptoms are better

## 2019-12-12 ENCOUNTER — Encounter: Payer: Self-pay | Admitting: Family Medicine

## 2019-12-12 ENCOUNTER — Ambulatory Visit (INDEPENDENT_AMBULATORY_CARE_PROVIDER_SITE_OTHER): Payer: BC Managed Care – PPO | Admitting: Family Medicine

## 2019-12-12 DIAGNOSIS — U071 COVID-19: Secondary | ICD-10-CM | POA: Diagnosis not present

## 2019-12-12 MED ORDER — PREDNISONE 10 MG PO TABS: ORAL_TABLET | ORAL | 0 refills | Status: DC

## 2019-12-12 NOTE — Progress Notes (Signed)
Virtual Visit via Video Note  I connected with [name] on 12/12/19 at  2:00 PM EST by a video enabled telemedicine application and verified that I am speaking with the correct person using two identifiers.  Location: Patient: home Provider: office    I discussed the limitations of evaluation and management by telemedicine and the availability of in person appointments. The patient expressed understanding and agreed to proceed.  Parties involved in encounter  Patient: Abigail Kramer  Provider:  Loura Pardon MD   History of Present Illness: Last visit px albuterol and prednisone She did improve   Yesterday- had a set back  Woke up with chest heaviness  Dry cough (not as bad as it was) - feels like a tickle in her throat   Inhaler does help some (does not resolve symptoms) She tolerated prednisone well   Feels tight in upper chest - better than this am  She is breathing harder -able to get through sentences  It seems like she is worse with wearing a mask   No fever  Pulse ox is 97%   Still fairly tired  May have overdid it yesterday-was running errands /wrapped presents   More mild headaches  No sinus pain  First thing in am - a little stuffy  No mucous to move out  Had a bad ST for 2 days - improved now  No ear pain    Sense of smell is dulled still  Appetite is fine   No n/v/d  No stomach pain   Patient Active Problem List   Diagnosis Date Noted  . COVID-19 virus infection 11/22/2019  . Positive ANA (antinuclear antibody) 01/26/2017  . Generalized anxiety disorder 10/22/2015  . Mouth ulcer 10/18/2014  . HSV (herpes simplex virus) infection 10/18/2014  . Raynaud phenomenon 10/18/2014  . Symptoms, such as flushing, sleeplessness, headache, lack of concentration, associated with the menopause 12/25/2013  . Routine general medical examination at a health care facility 04/08/2011   Past Medical History:  Diagnosis Date  . Fibroid 01/2009   small  . HSV  infection    oral - treated by dentist   . Hx: UTI (urinary tract infection)   . Irregular menses   . Ovarian cyst 01/2009 and 04/2009   Past Surgical History:  Procedure Laterality Date  . PARTIAL HYSTERECTOMY  07/2009   1 tube removed  . TUBAL LIGATION     Social History   Tobacco Use  . Smoking status: Never Smoker  . Smokeless tobacco: Never Used  Substance Use Topics  . Alcohol use: No    Alcohol/week: 0.0 standard drinks  . Drug use: No   Family History  Problem Relation Age of Onset  . Hypertension Mother   . Cancer Mother        breast  . Hypertension Brother   . Colon polyps Mother    Allergies  Allergen Reactions  . Prozac [Fluoxetine Hcl]     Sluggishness/dismotivation   Current Outpatient Medications on File Prior to Visit  Medication Sig Dispense Refill  . albuterol (VENTOLIN HFA) 108 (90 Base) MCG/ACT inhaler Inhale 2 puffs into the lungs every 4 (four) hours as needed for wheezing or shortness of breath. 18 g 1  . buPROPion (WELLBUTRIN XL) 150 MG 24 hr tablet TAKE 1 TABLET (150 MG TOTAL) BY MOUTH DAILY. 30 tablet 11  . valACYclovir (VALTREX) 1000 MG tablet Take 1,000 mg by mouth daily.      No current facility-administered medications on  file prior to visit.    Review of Systems  Constitutional: Positive for malaise/fatigue. Negative for chills and fever.  HENT: Negative for congestion, ear pain, sinus pain and sore throat.   Eyes: Negative for blurred vision, discharge and redness.  Respiratory: Positive for cough. Negative for sputum production, shortness of breath and stridor.        Tight chest w/o wheezing  Cardiovascular: Negative for chest pain, palpitations and leg swelling.  Gastrointestinal: Negative for abdominal pain, diarrhea, nausea and vomiting.  Musculoskeletal: Negative for myalgias.  Skin: Negative for rash.  Neurological: Negative for dizziness and headaches.    Observations/Objective: Patient appears well, in no distress Weight  is baseline  No facial swelling or asymmetry Normal voice-not hoarse and no slurred speech No obvious tremor or mobility impairment Moving neck and UEs normally Able to hear the call well  No cough or shortness of breath during interview  (no wheeze on forced exp) Talkative and mentally sharp with no cognitive changes No skin changes on face or neck , no rash or pallor Affect is normal    Assessment and Plan: Problem List Items Addressed This Visit      Other   COVID-19 virus infection    Initial improvement in prednisone-now chest is feeling tight again  Albuterol mdi helps somewhat  Some fatigue - getting stamina back  No purulent mucous/hypoxia or sinus symptoms so doubt bacterial infection Will repeat prednisone taper for irritable airways Watch closely (Pt has pulse ox machine)-given parameters for f/u (under 93% or acute change)  Will go to UC for eval if no improvement  inst to update [place]           Follow Up Instructions: Take prednisone again as directed Use inhaler as needed  Drink fluids / rest  Watch for fever and your 02 level   If worse - head to urgent care  If not improved in several days please let me know    I discussed the assessment and treatment plan with the patient. The patient was provided an opportunity to ask questions and all were answered. The patient agreed with the plan and demonstrated an understanding of the instructions.   The patient was advised to call back or seek an in-person evaluation if the symptoms worsen or if the condition fails to improve as anticipated.     Loura Pardon, MD

## 2019-12-12 NOTE — Patient Instructions (Signed)
Take prednisone again as directed Use inhaler as needed  Drink fluids / rest  Watch for fever and your 02 level   If worse - head to urgent care  If not improved in several days please let me know

## 2019-12-12 NOTE — Assessment & Plan Note (Signed)
Initial improvement in prednisone-now chest is feeling tight again  Albuterol mdi helps somewhat  Some fatigue - getting stamina back  No purulent mucous/hypoxia or sinus symptoms so doubt bacterial infection Will repeat prednisone taper for irritable airways Watch closely (Pt has pulse ox machine)-given parameters for f/u (under 93% or acute change)  Will go to UC for eval if no improvement  inst to update [place]

## 2019-12-25 ENCOUNTER — Telehealth: Payer: Self-pay

## 2019-12-25 ENCOUNTER — Ambulatory Visit (INDEPENDENT_AMBULATORY_CARE_PROVIDER_SITE_OTHER): Payer: BC Managed Care – PPO | Admitting: Family Medicine

## 2019-12-25 ENCOUNTER — Encounter: Payer: Self-pay | Admitting: Family Medicine

## 2019-12-25 VITALS — HR 73 | Temp 98.0°F | Wt 175.0 lb

## 2019-12-25 DIAGNOSIS — U071 COVID-19: Secondary | ICD-10-CM

## 2019-12-25 NOTE — Progress Notes (Signed)
Virtual Visit via Video Note  I connected with Abigail Kramer on 12/25/19 at 10:30 AM EST by a video enabled telemedicine application and verified that I am speaking with the correct person using two identifiers.  Location: Patient: home Provider: office    I discussed the limitations of evaluation and management by telemedicine and the availability of in person appointments. The patient expressed understanding and agreed to proceed.  Parties involved in encounter  [employer]  Provider:  Loura Pardon MD    History of Present Illness: Pt presents for continued sob after covid 19  She has been tx with prednisone twice  Last visit was 12/23  Now experiencing chest discomfort on and off (burning feeling)  Is sob with exertion - for instance carrying laundry across the house   Still coughing on and off  Has heartburn on and off  She takes omeprazole - inc it to bid for a while- helped some    Feels like her glands are some in her neck  Neck is sore to the touch   Last prednisone did helped some  Not as hoarse as she was Cough is less  Pulse ox is 99% on RA Has not been hypoxic  It was 95 % this am   Non productive cough   Patient Active Problem List   Diagnosis Date Noted  . COVID-19 virus infection 11/22/2019  . Positive ANA (antinuclear antibody) 01/26/2017  . Generalized anxiety disorder 10/22/2015  . Mouth ulcer 10/18/2014  . HSV (herpes simplex virus) infection 10/18/2014  . Raynaud phenomenon 10/18/2014  . Symptoms, such as flushing, sleeplessness, headache, lack of concentration, associated with the menopause 12/25/2013  . Routine general medical examination at a health care facility 04/08/2011   Past Medical History:  Diagnosis Date  . Fibroid 01/2009   small  . HSV infection    oral - treated by dentist   . Hx: UTI (urinary tract infection)   . Irregular menses   . Ovarian cyst 01/2009 and 04/2009   Past Surgical History:  Procedure  Laterality Date  . PARTIAL HYSTERECTOMY  07/2009   1 tube removed  . TUBAL LIGATION     Social History   Tobacco Use  . Smoking status: Never Smoker  . Smokeless tobacco: Never Used  Substance Use Topics  . Alcohol use: No    Alcohol/week: 0.0 standard drinks  . Drug use: No   Family History  Problem Relation Age of Onset  . Hypertension Mother   . Cancer Mother        breast  . Hypertension Brother   . Colon polyps Mother    Allergies  Allergen Reactions  . Prozac [Fluoxetine Hcl]     Sluggishness/dismotivation   Current Outpatient Medications on File Prior to Visit  Medication Sig Dispense Refill  . albuterol (VENTOLIN HFA) 108 (90 Base) MCG/ACT inhaler Inhale 2 puffs into the lungs every 4 (four) hours as needed for wheezing or shortness of breath. 18 g 1  . buPROPion (WELLBUTRIN XL) 150 MG 24 hr tablet TAKE 1 TABLET (150 MG TOTAL) BY MOUTH DAILY. 30 tablet 11  . Progesterone Micronized (PROGESTERONE PO) Take 50 mg by mouth at bedtime.    . valACYclovir (VALTREX) 1000 MG tablet Take 1,000 mg by mouth daily.      No current facility-administered medications on file prior to visit.     Review of Systems  Constitutional: Positive for malaise/fatigue. Negative for chills and fever.  HENT: Negative for congestion,  ear pain, sinus pain and sore throat.        Sore glands in neck  Eyes: Negative for blurred vision, discharge and redness.  Respiratory: Positive for cough and shortness of breath. Negative for stridor.        Unsure if wheezing   Cardiovascular: Negative for chest pain, palpitations and leg swelling.  Gastrointestinal: Negative for abdominal pain, diarrhea, nausea and vomiting.  Musculoskeletal: Negative for myalgias.  Skin: Negative for rash.  Neurological: Positive for headaches. Negative for dizziness.    Observations/Objective: Patient appears well, in no distress Weight is baseline  No facial swelling or asymmetry Normal voice-not hoarse and no  slurred speech No obvious tremor or mobility impairment Moving neck and UEs normally Able to hear the call well  No  shortness of breath during interview /cough sounds dry  No audible wheezing  Talkative and mentally sharp with no cognitive changes No skin changes on face or neck , no rash or pallor Affect is normal    Assessment and Plan: Problem List Items Addressed This Visit      Other   COVID-19 virus infection - Primary    Ongoing cough and tight breathing Improves with prednisone-(twice) then symptoms gradually improve Reassuring pulse ox/vitals Needs eval /exam at this point to r/o complications  Will schedule appt in our respiratory clinic          Follow Up Instructions:    I discussed the assessment and treatment plan with the patient. The patient was provided an opportunity to ask questions and all were answered. The patient agreed with the plan and demonstrated an understanding of the instructions.   The patient was advised to call back or seek an in-person evaluation if the symptoms worsen or if the condition fails to improve as anticipated.    Loura Pardon, MD

## 2019-12-25 NOTE — Patient Instructions (Addendum)
Our office will call you to schedule an appt in the respiratory clinic If symptoms suddenly worsen before that-go to the ER Keep [place] posted  Continue to watch your temperature and oxygen saturation

## 2019-12-25 NOTE — Telephone Encounter (Signed)
Pt said dx with covid 11/12/19 and last virtual appt 12/12/19. Pt has taken 2 prescriptions of prednisone and SOB seems to be worsening. Pt is not in distress and does not feel like needs to go to UC or ED. Pt still does not have sense of smell back yet. Pt scheduled virtual appt today at 10:30 with Dr Glori Bickers to see if needs respiratory clinic appt or what to do. UC & ED precautions given and pt voiced understanding. Pt will have vitals ready.

## 2019-12-25 NOTE — Assessment & Plan Note (Signed)
Ongoing cough and tight breathing Improves with prednisone-(twice) then symptoms gradually improve Reassuring pulse ox/vitals Needs eval /exam at this point to r/o complications  Will schedule appt in our respiratory clinic

## 2019-12-26 ENCOUNTER — Ambulatory Visit (INDEPENDENT_AMBULATORY_CARE_PROVIDER_SITE_OTHER): Payer: BC Managed Care – PPO | Admitting: Family Medicine

## 2019-12-26 DIAGNOSIS — R0789 Other chest pain: Secondary | ICD-10-CM

## 2019-12-26 DIAGNOSIS — Z8616 Personal history of COVID-19: Secondary | ICD-10-CM | POA: Diagnosis not present

## 2019-12-26 DIAGNOSIS — R0602 Shortness of breath: Secondary | ICD-10-CM

## 2019-12-26 NOTE — Progress Notes (Signed)
Patient ID: Abigail Kramer, female    DOB: 1966-09-06, 54 y.o.   MRN: [id number]  PCP: Abner Greenspan, MD  No chief complaint on file.   Subjective:  HPI Abigail Kramer is a 54 y.o. female presents for evaluation chest pain and shortness of breath s/p COVID-19 diagnosis in early November. Patient reports completing two rounds of prednisone with some improvement of SOB. Chest pain has never resolved. Chest occurs mediastinally and is unrelated to breathing. Chest pain is sharp to mild at times. This is new since COVID-19 diagnosis. She has no prior medical history of cardiovascular disease. She is not experiencing unintentional weight gain or edema. Oxygen level has remain normal throughout illness. She has a chest x-ray, which was negative for pneumonia in November. She is experiencing SOB with exertional activities. She has normal taste, however, experiences continued loss of smell. She is free of cough. Remains afebrile. Mostly concern as chest pain is occurring more routinely. Social History   Socioeconomic History  . Marital status: Married    Spouse name: Not on file  . Number of children: 2  . Years of education: Not on file  . Highest education level: Not on file  Occupational History  . Occupation: Sales  Tobacco Use  . Smoking status: Never Smoker  . Smokeless tobacco: Never Used  Substance and Sexual Activity  . Alcohol use: No    Alcohol/week: 0.0 standard drinks  . Drug use: No  . Sexual activity: Not on file  Other Topics Concern  . Not on file  Social History Narrative  . Not on file   Social Determinants of Health   Financial Resource Strain:   . Difficulty of Paying Living Expenses: Not on file  Food Insecurity:   . Worried About Charity fundraiser in the Last Year: Not on file  . Ran Out of Food in the Last Year: Not on file  Transportation Needs:   . Lack of Transportation (Medical): Not on file  . Lack of Transportation (Non-Medical): Not on file   Physical Activity:   . Days of Exercise per Week: Not on file  . Minutes of Exercise per Session: Not on file  Stress:   . Feeling of Stress : Not on file  Social Connections:   . Frequency of Communication with Friends and Family: Not on file  . Frequency of Social Gatherings with Friends and Family: Not on file  . Attends Religious Services: Not on file  . Active Member of Clubs or Organizations: Not on file  . Attends Archivist Meetings: Not on file  . Marital Status: Not on file  Intimate Partner Violence:   . Fear of Current or Ex-Partner: Not on file  . Emotionally Abused: Not on file  . Physically Abused: Not on file  . Sexually Abused: Not on file    Family History  Problem Relation Age of Onset  . Hypertension Mother   . Cancer Mother        breast  . Hypertension Brother   . Colon polyps Mother     Review of Systems Pertinent negatives listed in HPI Patient Active Problem List   Diagnosis Date Noted  . COVID-19 virus infection 11/22/2019  . Positive ANA (antinuclear antibody) 01/26/2017  . Generalized anxiety disorder 10/22/2015  . Mouth ulcer 10/18/2014  . HSV (herpes simplex virus) infection 10/18/2014  . Raynaud phenomenon 10/18/2014  . Symptoms, such as flushing, sleeplessness, headache, lack of concentration, associated with the  menopause 12/25/2013  . Routine general medical examination at a health care facility 04/08/2011    Allergies  Allergen Reactions  . Prozac [Fluoxetine Hcl]     Sluggishness/dismotivation    Prior to Admission medications   Medication Sig Start Date End Date Taking? Authorizing Provider  albuterol (VENTOLIN HFA) 108 (90 Base) MCG/ACT inhaler Inhale 2 puffs into the lungs every 4 (four) hours as needed for wheezing or shortness of breath. 11/22/19   Tower, Wynelle Fanny, MD  buPROPion (WELLBUTRIN XL) 150 MG 24 hr tablet TAKE 1 TABLET (150 MG TOTAL) BY MOUTH DAILY. 05/16/19   Tower, Wynelle Fanny, MD  Progesterone Micronized  (PROGESTERONE PO) Take 50 mg by mouth at bedtime.    [provider]  valACYclovir (VALTREX) 1000 MG tablet Take 1,000 mg by mouth daily.  12/31/14   [provider]    Past Medical, Surgical Family and Social History reviewed and updated.    Objective:  There were no vitals filed for this visit.  Wt Readings from Last 3 Encounters:  12/25/19 175 lb (79.4 kg)  05/16/19 177 lb 3 oz (80.4 kg)  05/10/18 174 lb 4 oz (79 kg)     Physical Exam General appearance: alert, well developed, well nourished, cooperative and in no distress Head: Normocephalic, without obvious abnormality, atraumatic Respiratory: Respirations even and unlabored, normal respiratory rate, negative for wheezing, rales, or crackle Heart: rate and rhythm normal. No gallop or murmurs noted on exam  Extremities: No gross deformities Skin: Skin color, texture, turgor normal. No rashes seen  Psych: Appropriate mood and affect. Neurologic: Mental status: Alert, oriented to person, place, and time, thought content appropriate.  Assessment & Plan:  1. Chest pain, atypical, no known history of cardiovascular disease. Pt seen today in respiratory clinic for management of acute complications related to covid-19. Patient was diagnosed with COVID-19 >30 days prior and this concern will likely require further work-up by PCP and likely a cardiologist. Will obtain a repeat CXR to rule out bronchial changes and pneumonia less likely given unremarkable physical exam. Encouraged follow-up with PCP for consideration of a referral to cardiologist. - DG Chest 2 View; Future  2. Shortness of breath 3. History of COVID-19, residual SOB and chest pain. Possible s/p residual symptoms stemming from COVID-19. - DG Chest 2 View, ordered to rule PNA or any other bronchial changes     Results of chest x-ray will be communicated via MyChart.  -The patient was given clear instructions to go to ER or return to medical center if  symptoms do not improve, worsen or new problems develop. The patient verbalized understanding.       Molli Barrows, FNP-C [facility], PRN Provider  [facility]. [place], [place] [facility] Phone: [phone] Clinic Fax: [phone] Clinic Hours: 5:30 pm -7:30 pm (Monday, Wednesday, and Friday)

## 2019-12-26 NOTE — Patient Instructions (Signed)
For x-ray imaging: [facility]  [address], [place], [place] [address] Enter through [facility] and request x-ray department. You will be contacted either via phone or via [place] with your x-ray result.  If the x-ray is normal, I could recommend follow-up with cardiology.    Nonspecific Chest Pain Chest pain can be caused by many different conditions. Some causes of chest pain can be life-threatening. These will require treatment right away. Serious causes of chest pain include:  Heart attack.  A tear in the body's main blood vessel.  Redness and swelling (inflammation) around your heart.  Blood clot in your lungs. Other causes of chest pain may not be so serious. These include:  Heartburn.  Anxiety or stress.  Damage to bones or muscles in your chest.  Lung infections. Chest pain can feel like:  Pain or discomfort in your chest.  Crushing, pressure, aching, or squeezing pain.  Burning or tingling.  Dull or sharp pain that is worse when you move, cough, or take a deep breath.  Pain or discomfort that is also felt in your back, neck, jaw, shoulder, or arm, or pain that spreads to any of these areas. It is hard to know whether your pain is caused by something that is serious or something that is not so serious. So it is important to see your doctor right away if you have chest pain. Follow these instructions at home: Medicines  Take over-the-counter and prescription medicines only as told by your doctor.  If you were prescribed an antibiotic medicine, take it as told by your doctor. Do not stop taking the antibiotic even if you start to feel better. Lifestyle   Rest as told by your doctor.  Do not use any products that contain nicotine or tobacco, such as cigarettes, e-cigarettes, and chewing tobacco. If you need help quitting, ask your doctor.  Do not drink alcohol.  Make lifestyle changes as told by your doctor. These may include: ? Getting  regular exercise. Ask your doctor what activities are safe for you. ? Eating a heart-healthy diet. A diet and nutrition specialist (dietitian) can help you to learn healthy eating options. ? Staying at a healthy weight. ? Treating diabetes or high blood pressure, if needed. ? Lowering your stress. Activities such as yoga and relaxation techniques can help. General instructions  Pay attention to any changes in your symptoms. Tell your doctor about them or any new symptoms.  Avoid any activities that cause chest pain.  Keep all follow-up visits as told by your doctor. This is important. You may need more testing if your chest pain does not go away. Contact a doctor if:  Your chest pain does not go away.  You feel depressed.  You have a fever. Get help right away if:  Your chest pain is worse.  You have a cough that gets worse, or you cough up blood.  You have very bad (severe) pain in your belly (abdomen).  You pass out (faint).  You have either of these for no clear reason: ? Sudden chest discomfort. ? Sudden discomfort in your arms, back, neck, or jaw.  You have shortness of breath at any time.  You suddenly start to sweat, or your skin gets clammy.  You feel sick to your stomach (nauseous).  You throw up (vomit).  You suddenly feel lightheaded or dizzy.  You feel very weak or tired.  Your heart starts to beat fast, or it feels like it is  skipping beats. These symptoms may be an emergency. Do not wait to see if the symptoms will go away. Get medical help right away. Call your local emergency services (911 in the [place] Do not drive yourself to the hospital. Summary  Chest pain can be caused by many different conditions. The cause may be serious and need treatment right away. If you have chest pain, see your doctor right away.  Follow your doctor's instructions for taking medicines and making lifestyle changes.  Keep all follow-up visits as told by your doctor.  This includes visits for any further testing if your chest pain does not go away.  Be sure to know the signs that show that your condition has become worse. Get help right away if you have these symptoms. This information is not intended to replace advice given to you by your health care provider. Make sure you discuss any questions you have with your health care provider. Document Revised: 06/08/2018 Document Reviewed: 06/08/2018 Elsevier Patient Education  [place] of Breath, Adult Shortness of breath means you have trouble breathing. Shortness of breath could be a sign of a medical problem. Follow these instructions at home:   Watch for any changes in your symptoms.  Do not use any products that contain nicotine or tobacco, such as cigarettes, e-cigarettes, and chewing tobacco.  Do not smoke. Smoking can cause shortness of breath. If you need help to quit smoking, ask your doctor.  Avoid things that can make it harder to breathe, such as: ? Mold. ? Dust. ? Air pollution. ? Chemical smells. ? Things that can cause allergy symptoms (allergens), if you have allergies.  Keep your living space clean. Use products that help remove mold and dust.  Rest as needed. Slowly return to your normal activities.  Take over-the-counter and prescription medicines only as told by your doctor. This includes oxygen therapy and inhaled medicines.  Keep all follow-up visits as told by your doctor. This is important. Contact a doctor if:  Your condition does not get better as soon as expected.  You have a hard time doing your normal activities, even after you rest.  You have new symptoms. Get help right away if:  Your shortness of breath gets worse.  You have trouble breathing when you are resting.  You feel light-headed or you pass out (faint).  You have a cough that is not helped by medicines.  You cough up blood.  You have pain with breathing.  You have  pain in your chest, arms, shoulders, or belly (abdomen).  You have a fever.  You cannot walk up stairs.  You cannot exercise the way you normally do. These symptoms may represent a serious problem that is an emergency. Do not wait to see if the symptoms will go away. Get medical help right away. Call your local emergency services (911 in the [place] Do not drive yourself to the hospital. Summary  Shortness of breath is when you have trouble breathing enough air. It can be a sign of a medical problem.  Avoid things that make it hard for you to breathe, such as smoking, pollution, mold, and dust.  Watch for any changes in your symptoms. Contact your doctor if you do not get better or you get worse. This information is not intended to replace advice given to you by your health care provider. Make sure you discuss any questions you have with your health care provider. Document Revised: 05/08/2018 Document Reviewed:  05/08/2018 Elsevier Patient Education  [place].

## 2019-12-27 ENCOUNTER — Ambulatory Visit (HOSPITAL_COMMUNITY)
Admission: RE | Admit: 2019-12-27 | Discharge: 2019-12-27 | Disposition: A | Discharge status: Home / Self Care | Payer: BC Managed Care – PPO | Source: Ambulatory Visit | Attending: Family Medicine | Admitting: Family Medicine

## 2019-12-27 ENCOUNTER — Encounter: Payer: Self-pay | Admitting: Family Medicine

## 2019-12-27 ENCOUNTER — Other Ambulatory Visit: Payer: Self-pay

## 2019-12-27 DIAGNOSIS — R0789 Other chest pain: Secondary | ICD-10-CM

## 2019-12-27 DIAGNOSIS — R079 Chest pain, unspecified: Secondary | ICD-10-CM | POA: Diagnosis not present

## 2019-12-27 DIAGNOSIS — Z8616 Personal history of COVID-19: Secondary | ICD-10-CM | POA: Diagnosis not present

## 2019-12-27 DIAGNOSIS — R0602 Shortness of breath: Secondary | ICD-10-CM | POA: Diagnosis not present

## 2019-12-29 ENCOUNTER — Telehealth: Payer: Self-pay | Admitting: Family Medicine

## 2019-12-29 DIAGNOSIS — R0789 Other chest pain: Secondary | ICD-10-CM | POA: Insufficient documentation

## 2019-12-29 NOTE — Telephone Encounter (Signed)
-----   Message from [place] sent at 12/28/2019  4:34 PM EST ----- Patient advised and states she wants to proceed with referral to cardiology.  Today she felt fine but last night was waking up with SOB. Seems to have issues at night.

## 2019-12-29 NOTE — Telephone Encounter (Signed)
Ref done  Will route to [facility] 

## 2019-12-31 NOTE — Telephone Encounter (Signed)
Appt has been scheduled.

## 2020-01-08 ENCOUNTER — Other Ambulatory Visit: Payer: Self-pay

## 2020-01-08 ENCOUNTER — Ambulatory Visit (INDEPENDENT_AMBULATORY_CARE_PROVIDER_SITE_OTHER): Payer: BC Managed Care – PPO | Admitting: Cardiology

## 2020-01-08 ENCOUNTER — Encounter: Payer: Self-pay | Admitting: Gastroenterology

## 2020-01-08 VITALS — BP 136/100 | HR 79 | Ht 67.0 in | Wt 180.5 lb

## 2020-01-08 DIAGNOSIS — R079 Chest pain, unspecified: Secondary | ICD-10-CM

## 2020-01-08 DIAGNOSIS — R0602 Shortness of breath: Secondary | ICD-10-CM

## 2020-01-08 NOTE — Progress Notes (Signed)
Cardiology Office Note:    Date:  01/08/2020   ID:  [name], DOB 13-Mar-1966, MRN [id number]  PCP:  Abner Greenspan, MD  Cardiologist:  Kate Sable, MD  Electrophysiologist:  None   Referring MD: Abner Greenspan, MD   Chief Complaint  Patient presents with  . New Patient (Initial Visit)    SOB/chest discomfort/R hand swelling; Meds verbally reviewed with patient.   Abigail Kramer is a 54 y.o. female who is being seen today for the evaluation of chest pain at the request of [place], Wynelle Fanny, MD.  History of Present Illness:    Abigail Kramer is a 54 y.o. female with a hx of GERD, presenting with chest pain and shortness of breath for about 2 months now.  Patient was diagnosed with COVID-19 roughly 2 months ago in November 2020.  Since then she has felt chest discomfort which she rates as a 5 out of 10 and dyspnea on exertion.  Chest pain is not related to exertion and typically last the entire day when it occurs.  Symptoms of chest pain seems to have improved somewhat but dyspnea on exertion has persisted.  Dyspnea on exertion also started after a COVID-19 infection.  She has been evaluated with a chest x-ray, obtained 2 weeks ago, with no active cardiopulmonary disease.  She denies palpitations but states having occasional skipped beats.  Denies presyncope or syncope.  Denies any history of heart disease.  Mother has a history of SVT.  Past Medical History:  Diagnosis Date  . Fibroid 01/2009   small  . HSV infection    oral - treated by dentist   . Hx: UTI (urinary tract infection)   . Irregular menses   . Ovarian cyst 01/2009 and 04/2009    Past Surgical History:  Procedure Laterality Date  . PARTIAL HYSTERECTOMY  07/2009   1 tube removed  . TUBAL LIGATION      Current Medications: Current Meds  Medication Sig  . albuterol (VENTOLIN HFA) 108 (90 Base) MCG/ACT inhaler Inhale 2 puffs into the lungs every 4 (four) hours as needed for wheezing or shortness of breath.  Marland Kitchen  buPROPion (WELLBUTRIN XL) 150 MG 24 hr tablet TAKE 1 TABLET (150 MG TOTAL) BY MOUTH DAILY.  . Progesterone Micronized (PROGESTERONE PO) Take 50 mg by mouth at bedtime.  . valACYclovir (VALTREX) 1000 MG tablet Take 1,000 mg by mouth daily.      Allergies:   Prozac [fluoxetine hcl]   Social History   Socioeconomic History  . Marital status: Married    Spouse name: Not on file  . Number of children: 2  . Years of education: Not on file  . Highest education level: Not on file  Occupational History  . Occupation: Sales  Tobacco Use  . Smoking status: Never Smoker  . Smokeless tobacco: Never Used  Substance and Sexual Activity  . Alcohol use: Yes    Alcohol/week: 0.0 standard drinks    Comment: occassionally  . Drug use: No  . Sexual activity: Not on file  Other Topics Concern  . Not on file  Social History Narrative  . Not on file   Social Determinants of Health   Financial Resource Strain:   . Difficulty of Paying Living Expenses: Not on file  Food Insecurity:   . Worried About Charity fundraiser in the Last Year: Not on file  . Ran Out of Food in the Last Year: Not on file  Transportation  Needs:   . Lack of Transportation (Medical): Not on file  . Lack of Transportation (Non-Medical): Not on file  Physical Activity:   . Days of Exercise per Week: Not on file  . Minutes of Exercise per Session: Not on file  Stress:   . Feeling of Stress : Not on file  Social Connections:   . Frequency of Communication with Friends and Family: Not on file  . Frequency of Social Gatherings with Friends and Family: Not on file  . Attends Religious Services: Not on file  . Active Member of Clubs or Organizations: Not on file  . Attends Archivist Meetings: Not on file  . Marital Status: Not on file     Family History: The patient's family history includes Breast cancer in her mother; Colon polyps in her mother; Hypertension in her brother and mother.  ROS:   Please see  the history of present illness.     All other systems reviewed and are negative.  EKGs/Labs/Other Studies Reviewed:    The following studies were reviewed today:   EKG:  EKG is  ordered today.  The ekg ordered today demonstrates normal sinus rhythm, normal ECG.  Recent Labs: 05/09/2019: ALT 14; BUN 13; Creatinine, Ser 0.76; Hemoglobin 13.3; Platelets 300.0; Potassium 4.9; Sodium 137; TSH 2.21  Recent Lipid Panel    Component Value Date/Time   CHOL 238 (H) 05/09/2019 0900   TRIG 107.0 05/09/2019 0900   HDL 102.00 05/09/2019 0900   CHOLHDL 2 05/09/2019 0900   VLDL 21.4 05/09/2019 0900   LDLCALC 115 (H) 05/09/2019 0900   LDLDIRECT 99.3 12/18/2013 0758    Physical Exam:    VS:  BP (!) 136/100 (BP Location: Right Arm, Patient Position: Sitting, Cuff Size: Normal)   Pulse 79   Ht 5\' 7"  (1.702 m)   Wt 180 lb 8 oz (81.9 kg)   LMP 12/20/2008   SpO2 99%   BMI 28.27 kg/m     Wt Readings from Last 3 Encounters:  01/08/20 180 lb 8 oz (81.9 kg)  12/25/19 175 lb (79.4 kg)  05/16/19 177 lb 3 oz (80.4 kg)     GEN:  Well nourished, well developed in no acute distress HEENT: Normal NECK: No JVD; No carotid bruits LYMPHATICS: No lymphadenopathy CARDIAC: RRR, no murmurs, rubs, gallops RESPIRATORY:  Clear to auscultation without rales, wheezing or rhonchi  ABDOMEN: Soft, non-tender, non-distended MUSCULOSKELETAL:  No edema; No deformity  SKIN: Warm and dry NEUROLOGIC:  Alert and oriented x 3 PSYCHIATRIC:  Normal affect   ASSESSMENT:    1. SOB (shortness of breath)   2. Chest pain, unspecified type    PLAN:    In order of problems listed above:  1. Patient with dyspnea on exertion since having COVID-19 infection.  Symptoms could be due to recovering from COVID-19 disease or viral myocarditis.  Will evaluate with echocardiogram. 2. Symptoms of chest pain very atypical.  Patient is low risk for cardiac disease with no risk factors.  Echocardiogram as above.  If the dyspnea on  exertion persists and or chest pain get worse, will consider stress testing.  Follow-up after echocardiogram.  This note was generated in part or whole with voice recognition software. Voice recognition is usually quite accurate but there are transcription errors that can and very often do occur. I apologize for any typographical errors that were not detected and corrected.  Medication Adjustments/Labs and Tests Ordered: Current medicines are reviewed at length with the patient today.  Concerns regarding medicines are outlined above.  Orders Placed This Encounter  Procedures  . EKG 12-Lead  . ECHOCARDIOGRAM COMPLETE   No orders of the defined types were placed in this encounter.   Patient Instructions  Medication Instructions:  Your physician recommends that you continue on your current medications as directed. Please refer to the Current Medication list given to you today.  *If you need a refill on your cardiac medications before your next appointment, please call your pharmacy*  Lab Work: None ordered If you have labs (blood work) drawn today and your tests are completely normal, you will receive your results only by: Marland Kitchen MyChart Message (if you have MyChart) OR . A paper copy in the mail If you have any lab test that is abnormal or we need to change your treatment, we will call you to review the results.  Testing/Procedures: Your physician has requested that you have an echocardiogram. Echocardiography is a painless test that uses sound waves to create images of your heart. It provides your doctor with information about the size and shape of your heart and how well your heart's chambers and valves are working. This procedure takes approximately one hour. There are no restrictions for this procedure.    Follow-Up: At [facility], you and your health needs are our priority.  As part of our continuing mission to provide you with exceptional heart care, we have created designated  Provider Care Teams.  These Care Teams include your primary Cardiologist (physician) and Advanced Practice Providers (APPs -  Physician Assistants and Nurse Practitioners) who all work together to provide you with the care you need, when you need it.  Your next appointment:   Follow up after echo  The format for your next appointment:   In Person  Provider:    You may see Kate Sable, MD or one of the following Advanced Practice Providers on your designated Care Team:    Murray Hodgkins, NP  Christell Faith, PA-C  Marrianne Mood, PA-C   Other Instructions  Echocardiogram An echocardiogram is a procedure that uses painless sound waves (ultrasound) to produce an image of the heart. Images from an echocardiogram can provide important information about:  Signs of coronary artery disease (CAD).  Aneurysm detection. An aneurysm is a weak or damaged part of an artery wall that bulges out from the normal force of blood pumping through the body.  Heart size and shape. Changes in the size or shape of the heart can be associated with certain conditions, including heart failure, aneurysm, and CAD.  Heart muscle function.  Heart valve function.  Signs of a past heart attack.  Fluid buildup around the heart.  Thickening of the heart muscle.  A tumor or infectious growth around the heart valves. Tell a health care provider about:  Any allergies you have.  All medicines you are taking, including vitamins, herbs, eye drops, creams, and over-the-counter medicines.  Any blood disorders you have.  Any surgeries you have had.  Any medical conditions you have.  Whether you are pregnant or may be pregnant. What are the risks? Generally, this is a safe procedure. However, problems may occur, including:  Allergic reaction to dye (contrast) that may be used during the procedure. What happens before the procedure? No specific preparation is needed. You may eat and drink  normally. What happens during the procedure?   An IV tube may be inserted into one of your veins.  You may receive contrast through this tube. A  contrast is an injection that improves the quality of the pictures from your heart.  A gel will be applied to your chest.  A wand-like tool (transducer) will be moved over your chest. The gel will help to transmit the sound waves from the transducer.  The sound waves will harmlessly bounce off of your heart to allow the heart images to be captured in real-time motion. The images will be recorded on a computer. The procedure may vary among health care providers and hospitals. What happens after the procedure?  You may return to your normal, everyday life, including diet, activities, and medicines, unless your health care provider tells you not to do that. Summary  An echocardiogram is a procedure that uses painless sound waves (ultrasound) to produce an image of the heart.  Images from an echocardiogram can provide important information about the size and shape of your heart, heart muscle function, heart valve function, and fluid buildup around your heart.  You do not need to do anything to prepare before this procedure. You may eat and drink normally.  After the echocardiogram is completed, you may return to your normal, everyday life, unless your health care provider tells you not to do that. This information is not intended to replace advice given to you by your health care provider. Make sure you discuss any questions you have with your health care provider. Document Revised: 03/29/2019 Document Reviewed: 01/08/2017 Elsevier Patient Education  2020 [place], Kate Sable, MD  01/08/2020 11:58 AM    [place]

## 2020-01-08 NOTE — Patient Instructions (Signed)
Medication Instructions:  Your physician recommends that you continue on your current medications as directed. Please refer to the Current Medication list given to you today.  *If you need a refill on your cardiac medications before your next appointment, please call your pharmacy*  Lab Work: None ordered If you have labs (blood work) drawn today and your tests are completely normal, you will receive your results only by: Marland Kitchen MyChart Message (if you have MyChart) OR . A paper copy in the mail If you have any lab test that is abnormal or we need to change your treatment, we will call you to review the results.  Testing/Procedures: Your physician has requested that you have an echocardiogram. Echocardiography is a painless test that uses sound waves to create images of your heart. It provides your doctor with information about the size and shape of your heart and how well your heart's chambers and valves are working. This procedure takes approximately one hour. There are no restrictions for this procedure.    Follow-Up: At [facility], you and your health needs are our priority.  As part of our continuing mission to provide you with exceptional heart care, we have created designated Provider Care Teams.  These Care Teams include your primary Cardiologist (physician) and Advanced Practice Providers (APPs -  Physician Assistants and Nurse Practitioners) who all work together to provide you with the care you need, when you need it.  Your next appointment:   Follow up after echo  The format for your next appointment:   In Person  Provider:    You may see Kate Sable, MD or one of the following Advanced Practice Providers on your designated Care Team:    Murray Hodgkins, NP  Christell Faith, PA-C  Marrianne Mood, PA-C   Other Instructions  Echocardiogram An echocardiogram is a procedure that uses painless sound waves (ultrasound) to produce an image of the heart. Images from an  echocardiogram can provide important information about:  Signs of coronary artery disease (CAD).  Aneurysm detection. An aneurysm is a weak or damaged part of an artery wall that bulges out from the normal force of blood pumping through the body.  Heart size and shape. Changes in the size or shape of the heart can be associated with certain conditions, including heart failure, aneurysm, and CAD.  Heart muscle function.  Heart valve function.  Signs of a past heart attack.  Fluid buildup around the heart.  Thickening of the heart muscle.  A tumor or infectious growth around the heart valves. Tell a health care provider about:  Any allergies you have.  All medicines you are taking, including vitamins, herbs, eye drops, creams, and over-the-counter medicines.  Any blood disorders you have.  Any surgeries you have had.  Any medical conditions you have.  Whether you are pregnant or may be pregnant. What are the risks? Generally, this is a safe procedure. However, problems may occur, including:  Allergic reaction to dye (contrast) that may be used during the procedure. What happens before the procedure? No specific preparation is needed. You may eat and drink normally. What happens during the procedure?   An IV tube may be inserted into one of your veins.  You may receive contrast through this tube. A contrast is an injection that improves the quality of the pictures from your heart.  A gel will be applied to your chest.  A wand-like tool (transducer) will be moved over your chest. The gel will help to transmit  the sound waves from the transducer.  The sound waves will harmlessly bounce off of your heart to allow the heart images to be captured in real-time motion. The images will be recorded on a computer. The procedure may vary among health care providers and hospitals. What happens after the procedure?  You may return to your normal, everyday life, including diet,  activities, and medicines, unless your health care provider tells you not to do that. Summary  An echocardiogram is a procedure that uses painless sound waves (ultrasound) to produce an image of the heart.  Images from an echocardiogram can provide important information about the size and shape of your heart, heart muscle function, heart valve function, and fluid buildup around your heart.  You do not need to do anything to prepare before this procedure. You may eat and drink normally.  After the echocardiogram is completed, you may return to your normal, everyday life, unless your health care provider tells you not to do that. This information is not intended to replace advice given to you by your health care provider. Make sure you discuss any questions you have with your health care provider. Document Revised: 03/29/2019 Document Reviewed: 01/08/2017 Elsevier Patient Education  [place].

## 2020-01-15 DIAGNOSIS — E039 Hypothyroidism, unspecified: Secondary | ICD-10-CM | POA: Diagnosis not present

## 2020-01-15 DIAGNOSIS — Z72820 Sleep deprivation: Secondary | ICD-10-CM | POA: Diagnosis not present

## 2020-01-15 DIAGNOSIS — N951 Menopausal and female climacteric states: Secondary | ICD-10-CM | POA: Diagnosis not present

## 2020-01-15 DIAGNOSIS — R232 Flushing: Secondary | ICD-10-CM | POA: Diagnosis not present

## 2020-01-17 DIAGNOSIS — R6882 Decreased libido: Secondary | ICD-10-CM | POA: Diagnosis not present

## 2020-01-17 DIAGNOSIS — R232 Flushing: Secondary | ICD-10-CM | POA: Diagnosis not present

## 2020-01-17 DIAGNOSIS — R5383 Other fatigue: Secondary | ICD-10-CM | POA: Diagnosis not present

## 2020-01-17 DIAGNOSIS — N951 Menopausal and female climacteric states: Secondary | ICD-10-CM | POA: Diagnosis not present

## 2020-01-29 ENCOUNTER — Ambulatory Visit (INDEPENDENT_AMBULATORY_CARE_PROVIDER_SITE_OTHER): Payer: BC Managed Care – PPO

## 2020-01-29 ENCOUNTER — Other Ambulatory Visit: Payer: Self-pay

## 2020-01-29 DIAGNOSIS — R079 Chest pain, unspecified: Secondary | ICD-10-CM | POA: Diagnosis not present

## 2020-01-29 DIAGNOSIS — R0602 Shortness of breath: Secondary | ICD-10-CM | POA: Diagnosis not present

## 2020-01-31 ENCOUNTER — Telehealth: Payer: Self-pay

## 2020-01-31 NOTE — Telephone Encounter (Signed)
-----   Message from Kate Sable, MD sent at 01/31/2020  1:18 PM EST ----- Normal systolic function, impaired relaxation normal filling pressures.  No findings to suggest reason for chest pain., keep follow-up appointment.

## 2020-01-31 NOTE — Telephone Encounter (Signed)
Attempted to call patient. LMTCB 01/31/2020   

## 2020-01-31 NOTE — Telephone Encounter (Signed)
Returned call to patient to make her aware of echo results. She verbalized understanding and had no further questions at this time.   Confirmed upcoming appt later this month.   Advised pt to call for any further questions or concerns.

## 2020-01-31 NOTE — Telephone Encounter (Signed)
Patient is returning your call.  

## 2020-02-08 ENCOUNTER — Encounter: Payer: Self-pay | Admitting: Cardiology

## 2020-02-08 ENCOUNTER — Ambulatory Visit (INDEPENDENT_AMBULATORY_CARE_PROVIDER_SITE_OTHER): Payer: BC Managed Care – PPO | Admitting: Cardiology

## 2020-02-08 ENCOUNTER — Other Ambulatory Visit: Payer: Self-pay

## 2020-02-08 VITALS — BP 140/86 | HR 72 | Ht 67.0 in | Wt 183.2 lb

## 2020-02-08 DIAGNOSIS — R0602 Shortness of breath: Secondary | ICD-10-CM

## 2020-02-08 DIAGNOSIS — R072 Precordial pain: Secondary | ICD-10-CM | POA: Diagnosis not present

## 2020-02-08 DIAGNOSIS — R079 Chest pain, unspecified: Secondary | ICD-10-CM | POA: Diagnosis not present

## 2020-02-08 MED ORDER — METOPROLOL TARTRATE 100 MG PO TABS: ORAL_TABLET | ORAL | 0 refills | Status: DC

## 2020-02-08 NOTE — Progress Notes (Signed)
Cardiology Office Note:    Date:  02/08/2020   ID:  Abigail Kramer, DOB July 26, 1966, MRN [id number]  PCP:  Abner Greenspan, MD  Cardiologist:  Kate Sable, MD  Electrophysiologist:  None   Referring MD: Abner Greenspan, MD   Chief Complaint  Patient presents with  . other    Follow up from Echo. Meds reviewed by the pt. verbally. Pt.c/o shortness of breath and chest pain with occas. irreg. heart beats.    Abigail Kramer is a 54 y.o. female who is being seen today for the evaluation of chest pain at the request of Tower, Wynelle Fanny, MD.  History of Present Illness:    Abigail Kramer is a 54 y.o. female with a hx of GERD, presenting for follow-up.  She was last seen with chest pain and shortness of breath.    Patient was diagnosed with COVID-19  in November 2020.  Since then she has felt chest discomfort which she rates as a 5 out of 10 and dyspnea on exertion.  Chest pain is not related to exertion and typically last the entire day when it occurs.  Symptoms of chest pain seems to have improved somewhat but dyspnea on exertion persisted.  Dyspnea on exertion also started after a COVID-19 infection.  She has been evaluated with a chest x-ray with no active cardiopulmonary disease.    Echocardiogram was ordered.  Patient presents for results.  She still has occasional left-sided chest pain and shortness of breath.  Past Medical History:  Diagnosis Date  . Fibroid 01/2009   small  . HSV infection    oral - treated by dentist   . Hx: UTI (urinary tract infection)   . Irregular menses   . Ovarian cyst 01/2009 and 04/2009    Past Surgical History:  Procedure Laterality Date  . PARTIAL HYSTERECTOMY  07/2009   1 tube removed  . TUBAL LIGATION      Current Medications: Current Meds  Medication Sig  . albuterol (VENTOLIN HFA) 108 (90 Base) MCG/ACT inhaler Inhale 2 puffs into the lungs every 4 (four) hours as needed for wheezing or shortness of breath.  Marland Kitchen buPROPion (WELLBUTRIN XL)  150 MG 24 hr tablet TAKE 1 TABLET (150 MG TOTAL) BY MOUTH DAILY.  . Progesterone Micronized (PROGESTERONE PO) Take 50 mg by mouth at bedtime.  . valACYclovir (VALTREX) 1000 MG tablet Take 1,000 mg by mouth daily.      Allergies:   Prozac [fluoxetine hcl]   Social History   Socioeconomic History  . Marital status: Married    Spouse name: Not on file  . Number of children: 2  . Years of education: Not on file  . Highest education level: Not on file  Occupational History  . Occupation: Sales  Tobacco Use  . Smoking status: Never Smoker  . Smokeless tobacco: Never Used  Substance and Sexual Activity  . Alcohol use: Yes    Alcohol/week: 0.0 standard drinks    Comment: occassionally  . Drug use: No  . Sexual activity: Not on file  Other Topics Concern  . Not on file  Social History Narrative  . Not on file   Social Determinants of Health   Financial Resource Strain:   . Difficulty of Paying Living Expenses: Not on file  Food Insecurity:   . Worried About Charity fundraiser in the Last Year: Not on file  . Ran Out of Food in the Last Year: Not on file  Transportation Needs:   . Film/video editor (Medical): Not on file  . Lack of Transportation (Non-Medical): Not on file  Physical Activity:   . Days of Exercise per Week: Not on file  . Minutes of Exercise per Session: Not on file  Stress:   . Feeling of Stress : Not on file  Social Connections:   . Frequency of Communication with Friends and Family: Not on file  . Frequency of Social Gatherings with Friends and Family: Not on file  . Attends Religious Services: Not on file  . Active Member of Clubs or Organizations: Not on file  . Attends Archivist Meetings: Not on file  . Marital Status: Not on file     Family History: The patient's family history includes Breast cancer in her mother; Colon polyps in her mother; Hypertension in her brother and mother.  ROS:   Please see the history of present  illness.     All other systems reviewed and are negative.  EKGs/Labs/Other Studies Reviewed:    The following studies were reviewed today:   EKG:  EKG is  ordered today.  The ekg ordered today demonstrates normal sinus rhythm, normal ECG.  Recent Labs: 05/09/2019: ALT 14; BUN 13; Creatinine, Ser 0.76; Hemoglobin 13.3; Platelets 300.0; Potassium 4.9; Sodium 137; TSH 2.21  Recent Lipid Panel    Component Value Date/Time   CHOL 238 (H) 05/09/2019 0900   TRIG 107.0 05/09/2019 0900   HDL 102.00 05/09/2019 0900   CHOLHDL 2 05/09/2019 0900   VLDL 21.4 05/09/2019 0900   LDLCALC 115 (H) 05/09/2019 0900   LDLDIRECT 99.3 12/18/2013 0758    Physical Exam:    VS:  BP 140/86 (BP Location: Left Arm, Patient Position: Sitting, Cuff Size: Normal)   Pulse 72   Ht 5\' 7"  (1.702 m)   Wt 183 lb 4 oz (83.1 kg)   LMP 12/20/2008   SpO2 98%   BMI 28.70 kg/m     Wt Readings from Last 3 Encounters:  02/08/20 183 lb 4 oz (83.1 kg)  01/08/20 180 lb 8 oz (81.9 kg)  12/25/19 175 lb (79.4 kg)     GEN:  Well nourished, well developed in no acute distress HEENT: Normal NECK: No JVD; No carotid bruits LYMPHATICS: No lymphadenopathy CARDIAC: RRR, no murmurs, rubs, gallops RESPIRATORY:  Clear to auscultation without rales, wheezing or rhonchi  ABDOMEN: Soft, non-tender, non-distended MUSCULOSKELETAL:  No edema; No deformity  SKIN: Warm and dry NEUROLOGIC:  Alert and oriented x 3 PSYCHIATRIC:  Normal affect   ASSESSMENT:    1. SOB (shortness of breath)   2. Chest pain, unspecified type   3. Precordial pain    PLAN:    In order of problems listed above:  1. Patient with dyspnea on exertion since having COVID-19 infection.  Echocardiogram showed normal systolic function with impaired relaxation and normal filling pressures.  No findings to suggest reason for shortness of breath or viral myocarditis.  EF 50-55% 2. Symptoms of chest pain are atypical.  Symptoms are persistent.  Will evaluate with  coronary CT angiogram for presence of CAD.  Follow-up after coronary CTA  This note was generated in part or whole with voice recognition software. Voice recognition is usually quite accurate but there are transcription errors that can and very often do occur. I apologize for any typographical errors that were not detected and corrected.  Medication Adjustments/Labs and Tests Ordered: Current medicines are reviewed at length with the patient today.  Concerns  regarding medicines are outlined above.  Orders Placed This Encounter  Procedures  . CT CORONARY MORPH W/CTA COR W/SCORE W/CA W/CM &/OR WO/CM  . CT CORONARY FRACTIONAL FLOW RESERVE DATA PREP  . CT CORONARY FRACTIONAL FLOW RESERVE FLUID ANALYSIS  . EKG 12-Lead   Meds ordered this encounter  Medications  . metoprolol tartrate (LOPRESSOR) 100 MG tablet    Sig: Take 1 tablet (100 mg) x 1 dose, 2 hours prior to your Cardiac CT    Dispense:  1 tablet    Refill:  0    Patient Instructions  Medication Instructions:  - Your physician recommends that you continue on your current medications as directed. Please refer to the Current Medication list given to you today.  *If you need a refill on your cardiac medications before your next appointment, please call your pharmacy*  Lab Work: - Your physician recommends that you return for lab work: BMP (once the date of your Cardiac CT is confirmed)  - [facility] entrance, 1st desk on the right to check in (Registration Desk), past the screening table - Lab hours: Monday- Friday (7:30 am- 5:30 pm)  - You do not have to be fasting  If you have labs (blood work) drawn today and your tests are completely normal, you will receive your results only by: Marland Kitchen MyChart Message (if you have MyChart) OR . A paper copy in the mail If you have any lab test that is abnormal or we need to change your treatment, we will call you to review the results.  Testing/Procedures: - Your physician has requested  that you have cardiac CT. Cardiac computed tomography (CT) is a painless test that uses an x-ray machine to take clear, detailed pictures of your heart.   Your cardiac CT will be scheduled at one of the below locations:   [facility] [address] [place], [place] [address] [phone]  [place] [address] [place], [place] [address] [phone]  If scheduled at [facility], please arrive at the [facility] main entrance of [facility] 30 minutes prior to test start time. Proceed to the [facility] Radiology Department (first floor) to check-in and test prep.  If scheduled at [facility], please arrive 15 mins early for check-in and test prep.  Please follow these instructions carefully (unless otherwise directed):   On the Night Before the Test: . Be sure to Drink plenty of water. . Do not consume any caffeinated/decaffeinated beverages or chocolate 12 hours prior to your test. . Do not take any antihistamines 12 hours prior to your test.  On the Day of the Test: . Drink plenty of water. Do not drink any water within one hour of the test. . Do not eat any food 4 hours prior to the test. . You may take your regular medications prior to the test.  . Take metoprolol (Lopressor) 100 mg x 1 dose two hours prior to test.. . FEMALES- please wear underwire-free bra if available   After the Test: . Drink plenty of water. . After receiving IV contrast, you may experience a mild flushed feeling. This is normal. . On occasion, you may experience a mild rash up to 24 hours after the test. This is not dangerous. If this occurs, you can take Benadryl 25 mg and increase your fluid intake. . If you experience trouble breathing, this can be serious. If it is severe call 911  IMMEDIATELY. If it is mild, please call our office.   Once we have confirmed authorization from your  insurance company, we will call you to set up a date and time for your test.   For non-scheduling related questions, please contact the cardiac imaging nurse navigator should you have any questions/concerns: Marchia Bond, RN Navigator Cardiac Imaging [facility] [phone] mobile    Follow-Up: At [facility], you and your health needs are our priority.  As part of our continuing mission to provide you with exceptional heart care, we have created designated Provider Care Teams.  These Care Teams include your primary Cardiologist (physician) and Advanced Practice Providers (APPs -  Physician Assistants and Nurse Practitioners) who all work together to provide you with the care you need, when you need it.  Your next appointment:   4 week(s)  The format for your next appointment:   In Person  Provider:   Kate Sable, MD  Other Instructions N/a   Cardiac CT Angiogram A cardiac CT angiogram is a procedure to look at the heart and the area around the heart. It may be done to help find the cause of chest pains or other symptoms of heart disease. During this procedure, a substance called contrast dye is injected into the blood vessels in the area to be checked. A large X-ray machine, called a CT scanner, then takes detailed pictures of the heart and the surrounding area. The procedure is also sometimes called a coronary CT angiogram, coronary artery scanning, or CTA. A cardiac CT angiogram allows the health care provider to see how well blood is flowing to and from the heart. The health care provider will be able to see if there are any problems, such as:  Blockage or narrowing of the coronary arteries in the heart.  Fluid around the heart.  Signs of weakness or disease in the muscles, valves, and tissues of the heart. Tell a health care provider about:  Any allergies you have. This is especially important if you have had a previous allergic reaction  to contrast dye.  All medicines you are taking, including vitamins, herbs, eye drops, creams, and over-the-counter medicines.  Any blood disorders you have.  Any surgeries you have had.  Any medical conditions you have.  Whether you are pregnant or may be pregnant.  Any anxiety disorders, chronic pain, or other conditions you have that may increase your stress or prevent you from lying still. What are the risks? Generally, this is a safe procedure. However, problems may occur, including:  Bleeding.  Infection.  Allergic reactions to medicines or dyes.  Damage to other structures or organs.  Kidney damage from the contrast dye that is used.  Increased risk of cancer from radiation exposure. This risk is low. Talk with your health care provider about: ? The risks and benefits of testing. ? How you can receive the lowest dose of radiation. What happens before the procedure?  Wear comfortable clothing and remove any jewelry, glasses, dentures, and hearing aids.  Follow instructions from your health care provider about eating and drinking. This may include: ? For 12 hours before the procedure -- avoid caffeine. This includes tea, coffee, soda, energy drinks, and diet pills. Drink plenty of water or other fluids that do not have caffeine in them. Being well hydrated can prevent complications. ? For 4-6 hours before the procedure -- stop eating and drinking. The contrast dye can cause nausea, but this is less likely if  your stomach is empty.  Ask your health care provider about changing or stopping your regular medicines. This is especially important if you are taking diabetes medicines, blood thinners, or medicines to treat problems with erections (erectile dysfunction). What happens during the procedure?   Hair on your chest may need to be removed so that small sticky patches called electrodes can be placed on your chest. These will transmit information that helps to monitor your  heart during the procedure.  An IV will be inserted into one of your veins.  You might be given a medicine to control your heart rate during the procedure. This will help to ensure that good images are obtained.  You will be asked to lie on an exam table. This table will slide in and out of the CT machine during the procedure.  Contrast dye will be injected into the IV. You might feel warm, or you may get a metallic taste in your mouth.  You will be given a medicine called nitroglycerin. This will relax or dilate the arteries in your heart.  The table that you are lying on will move into the CT machine tunnel for the scan.  The person running the machine will give you instructions while the scans are being done. You may be asked to: ? Keep your arms above your head. ? Hold your breath. ? Stay very still, even if the table is moving.  When the scanning is complete, you will be moved out of the machine.  The IV will be removed. The procedure may vary among health care providers and hospitals. What can I expect after the procedure? After your procedure, it is common to have:  A metallic taste in your mouth from the contrast dye.  A feeling of warmth.  A headache from the nitroglycerin. Follow these instructions at home:  Take over-the-counter and prescription medicines only as told by your health care provider.  If you are told, drink enough fluid to keep your urine pale yellow. This will help to flush the contrast dye out of your body.  Most people can return to their normal activities right after the procedure. Ask your health care provider what activities are safe for you.  It is up to you to get the results of your procedure. Ask your health care provider, or the department that is doing the procedure, when your results will be ready.  Keep all follow-up visits as told by your health care provider. This is important. Contact a health care provider if:  You have any  symptoms of allergy to the contrast dye. These include: ? Shortness of breath. ? Rash or hives. ? A racing heartbeat. Summary  A cardiac CT angiogram is a procedure to look at the heart and the area around the heart. It may be done to help find the cause of chest pains or other symptoms of heart disease.  During this procedure, a large X-ray machine, called a CT scanner, takes detailed pictures of the heart and the surrounding area after a contrast dye has been injected into blood vessels in the area.  Ask your health care provider about changing or stopping your regular medicines before the procedure. This is especially important if you are taking diabetes medicines, blood thinners, or medicines to treat erectile dysfunction.  If you are told, drink enough fluid to keep your urine pale yellow. This will help to flush the contrast dye out of your body. This information is not intended to replace advice  given to you by your health care provider. Make sure you discuss any questions you have with your health care provider. Document Revised: 08/01/2019 Document Reviewed: 08/01/2019 Elsevier Patient Education  2020 [place], Kate Sable, MD  02/08/2020 4:36 PM    [place]

## 2020-02-08 NOTE — Patient Instructions (Addendum)
Medication Instructions:  - Your physician recommends that you continue on your current medications as directed. Please refer to the Current Medication list given to you today.  *If you need a refill on your cardiac medications before your next appointment, please call your pharmacy*  Lab Work: - Your physician recommends that you return for lab work: BMP (once the date of your Cardiac CT is confirmed)  - [place] entrance, 1st desk on the right to check in (Registration Desk), past the screening table - Lab hours: Monday- Friday (7:30 am- 5:30 pm)  - You do not have to be fasting  If you have labs (blood work) drawn today and your tests are completely normal, you will receive your results only by: Marland Kitchen MyChart Message (if you have MyChart) OR . A paper copy in the mail If you have any lab test that is abnormal or we need to change your treatment, we will call you to review the results.  Testing/Procedures: - Your physician has requested that you have cardiac CT. Cardiac computed tomography (CT) is a painless test that uses an x-ray machine to take clear, detailed pictures of your heart.   Your cardiac CT will be scheduled at one of the below locations:   [facility] [address] [place], [place] [address] [phone]  [place] [address] [place], [place] [address] [phone]  If scheduled at [facility], please arrive at the [facility] main entrance of [facility] 30 minutes prior to test start time. Proceed to the [facility] Radiology Department (first floor) to check-in and test prep.  If scheduled at [facility], please arrive 15 mins early for check-in and test prep.  Please follow these instructions carefully (unless otherwise directed):   On the Night Before the Test: . Be sure to Drink plenty of water. . Do not consume any caffeinated/decaffeinated  beverages or chocolate 12 hours prior to your test. . Do not take any antihistamines 12 hours prior to your test.  On the Day of the Test: . Drink plenty of water. Do not drink any water within one hour of the test. . Do not eat any food 4 hours prior to the test. . You may take your regular medications prior to the test.  . Take metoprolol (Lopressor) 100 mg x 1 dose two hours prior to test.. . FEMALES- please wear underwire-free bra if available   After the Test: . Drink plenty of water. . After receiving IV contrast, you may experience a mild flushed feeling. This is normal. . On occasion, you may experience a mild rash up to 24 hours after the test. This is not dangerous. If this occurs, you can take Benadryl 25 mg and increase your fluid intake. . If you experience trouble breathing, this can be serious. If it is severe call 911 IMMEDIATELY. If it is mild, please call our office.   Once we have confirmed authorization from your insurance company, we will call you to set up a date and time for your test.   For non-scheduling related questions, please contact the cardiac imaging nurse navigator should you have any questions/concerns: Marchia Bond, RN Navigator Cardiac Imaging [facility] [phone] mobile    Follow-Up: At [facility], you and your health needs are our priority.  As part of our continuing mission to provide you with exceptional heart care, we have created designated Provider  Care Teams.  These Care Teams include your primary Cardiologist (physician) and Advanced Practice Providers (APPs -  Physician Assistants and Nurse Practitioners) who all work together to provide you with the care you need, when you need it.  Your next appointment:   4 week(s)  The format for your next appointment:   In Person  Provider:   Kate Sable, MD  Other Instructions N/a   Cardiac CT Angiogram A cardiac CT angiogram is a procedure to look  at the heart and the area around the heart. It may be done to help find the cause of chest pains or other symptoms of heart disease. During this procedure, a substance called contrast dye is injected into the blood vessels in the area to be checked. A large X-ray machine, called a CT scanner, then takes detailed pictures of the heart and the surrounding area. The procedure is also sometimes called a coronary CT angiogram, coronary artery scanning, or CTA. A cardiac CT angiogram allows the health care provider to see how well blood is flowing to and from the heart. The health care provider will be able to see if there are any problems, such as:  Blockage or narrowing of the coronary arteries in the heart.  Fluid around the heart.  Signs of weakness or disease in the muscles, valves, and tissues of the heart. Tell a health care provider about:  Any allergies you have. This is especially important if you have had a previous allergic reaction to contrast dye.  All medicines you are taking, including vitamins, herbs, eye drops, creams, and over-the-counter medicines.  Any blood disorders you have.  Any surgeries you have had.  Any medical conditions you have.  Whether you are pregnant or may be pregnant.  Any anxiety disorders, chronic pain, or other conditions you have that may increase your stress or prevent you from lying still. What are the risks? Generally, this is a safe procedure. However, problems may occur, including:  Bleeding.  Infection.  Allergic reactions to medicines or dyes.  Damage to other structures or organs.  Kidney damage from the contrast dye that is used.  Increased risk of cancer from radiation exposure. This risk is low. Talk with your health care provider about: ? The risks and benefits of testing. ? How you can receive the lowest dose of radiation. What happens before the procedure?  Wear comfortable clothing and remove any jewelry, glasses, dentures,  and hearing aids.  Follow instructions from your health care provider about eating and drinking. This may include: ? For 12 hours before the procedure -- avoid caffeine. This includes tea, coffee, soda, energy drinks, and diet pills. Drink plenty of water or other fluids that do not have caffeine in them. Being well hydrated can prevent complications. ? For 4-6 hours before the procedure -- stop eating and drinking. The contrast dye can cause nausea, but this is less likely if your stomach is empty.  Ask your health care provider about changing or stopping your regular medicines. This is especially important if you are taking diabetes medicines, blood thinners, or medicines to treat problems with erections (erectile dysfunction). What happens during the procedure?   Hair on your chest may need to be removed so that small sticky patches called electrodes can be placed on your chest. These will transmit information that helps to monitor your heart during the procedure.  An IV will be inserted into one of your veins.  You might be given a medicine to control  your heart rate during the procedure. This will help to ensure that good images are obtained.  You will be asked to lie on an exam table. This table will slide in and out of the CT machine during the procedure.  Contrast dye will be injected into the IV. You might feel warm, or you may get a metallic taste in your mouth.  You will be given a medicine called nitroglycerin. This will relax or dilate the arteries in your heart.  The table that you are lying on will move into the CT machine tunnel for the scan.  The person running the machine will give you instructions while the scans are being done. You may be asked to: ? Keep your arms above your head. ? Hold your breath. ? Stay very still, even if the table is moving.  When the scanning is complete, you will be moved out of the machine.  The IV will be removed. The procedure may vary  among health care providers and hospitals. What can I expect after the procedure? After your procedure, it is common to have:  A metallic taste in your mouth from the contrast dye.  A feeling of warmth.  A headache from the nitroglycerin. Follow these instructions at home:  Take over-the-counter and prescription medicines only as told by your health care provider.  If you are told, drink enough fluid to keep your urine pale yellow. This will help to flush the contrast dye out of your body.  Most people can return to their normal activities right after the procedure. Ask your health care provider what activities are safe for you.  It is up to you to get the results of your procedure. Ask your health care provider, or the department that is doing the procedure, when your results will be ready.  Keep all follow-up visits as told by your health care provider. This is important. Contact a health care provider if:  You have any symptoms of allergy to the contrast dye. These include: ? Shortness of breath. ? Rash or hives. ? A racing heartbeat. Summary  A cardiac CT angiogram is a procedure to look at the heart and the area around the heart. It may be done to help find the cause of chest pains or other symptoms of heart disease.  During this procedure, a large X-ray machine, called a CT scanner, takes detailed pictures of the heart and the surrounding area after a contrast dye has been injected into blood vessels in the area.  Ask your health care provider about changing or stopping your regular medicines before the procedure. This is especially important if you are taking diabetes medicines, blood thinners, or medicines to treat erectile dysfunction.  If you are told, drink enough fluid to keep your urine pale yellow. This will help to flush the contrast dye out of your body. This information is not intended to replace advice given to you by your health care provider. Make sure you  discuss any questions you have with your health care provider. Document Revised: 08/01/2019 Document Reviewed: 08/01/2019 Elsevier Patient Education  [place].

## 2020-02-15 DIAGNOSIS — Z1231 Encounter for screening mammogram for malignant neoplasm of breast: Secondary | ICD-10-CM | POA: Diagnosis not present

## 2020-02-22 DIAGNOSIS — R922 Inconclusive mammogram: Secondary | ICD-10-CM | POA: Diagnosis not present

## 2020-02-22 DIAGNOSIS — N6321 Unspecified lump in the left breast, upper outer quadrant: Secondary | ICD-10-CM | POA: Diagnosis not present

## 2020-03-07 ENCOUNTER — Ambulatory Visit: Payer: BC Managed Care – PPO | Admitting: Cardiology

## 2020-03-07 ENCOUNTER — Other Ambulatory Visit
Admission: RE | Admit: 2020-03-07 | Discharge: 2020-03-07 | Disposition: A | Discharge status: Home / Self Care | Payer: BC Managed Care – PPO | Source: Ambulatory Visit | Attending: Cardiology | Admitting: Cardiology

## 2020-03-07 ENCOUNTER — Other Ambulatory Visit: Payer: Self-pay | Admitting: *Deleted

## 2020-03-07 DIAGNOSIS — R072 Precordial pain: Secondary | ICD-10-CM

## 2020-03-07 DIAGNOSIS — Z01812 Encounter for preprocedural laboratory examination: Secondary | ICD-10-CM | POA: Insufficient documentation

## 2020-03-07 LAB — BASIC METABOLIC PANEL
Anion gap: 9 (ref 5–15)
BUN: 19 mg/dL (ref 6–20)
CO2: 26 mmol/L (ref 22–32)
Calcium: 9.2 mg/dL (ref 8.9–10.3)
Chloride: 103 mmol/L (ref 98–111)
Creatinine, Ser: 0.87 mg/dL (ref 0.44–1.00)
GFR calc Af Amer: >60 mL/min (ref >60)
GFR calc non Af Amer: >60 mL/min (ref >60)
Glucose, Bld: 96 mg/dL (ref 70–99)
Potassium: 3.9 mmol/L (ref 3.5–5.1)
Sodium: 138 mmol/L (ref 135–145)

## 2020-03-12 ENCOUNTER — Encounter (HOSPITAL_COMMUNITY): Payer: Self-pay

## 2020-03-13 ENCOUNTER — Ambulatory Visit
Admission: RE | Admit: 2020-03-13 | Discharge: 2020-03-13 | Disposition: A | Discharge status: Home / Self Care | Payer: BC Managed Care – PPO | Source: Ambulatory Visit | Attending: Cardiology | Admitting: Cardiology

## 2020-03-13 ENCOUNTER — Other Ambulatory Visit: Payer: Self-pay

## 2020-03-13 DIAGNOSIS — R072 Precordial pain: Secondary | ICD-10-CM

## 2020-03-13 MED ORDER — IOHEXOL 350 MG/ML SOLN
100.0000 mL | Freq: Once | INTRAVENOUS | Status: AC | PRN
  Administered 2020-03-13: 16:00:00 100 mL via INTRAVENOUS

## 2020-03-13 MED ORDER — NITROGLYCERIN 0.4 MG SL SUBL
0.8000 mg | SUBLINGUAL_TABLET | Freq: Once | SUBLINGUAL | Status: AC
  Administered 2020-03-13: 0.8 mg via SUBLINGUAL

## 2020-03-13 NOTE — Progress Notes (Signed)
Patient tolerated CT well. Drank a soda and ate peanut butter and crackers after. Ambulated to exit steady gait.

## 2020-03-18 ENCOUNTER — Other Ambulatory Visit: Payer: Self-pay

## 2020-03-18 ENCOUNTER — Ambulatory Visit (INDEPENDENT_AMBULATORY_CARE_PROVIDER_SITE_OTHER): Payer: BC Managed Care – PPO | Admitting: Cardiology

## 2020-03-18 ENCOUNTER — Encounter: Payer: Self-pay | Admitting: Cardiology

## 2020-03-18 VITALS — BP 100/70 | HR 72 | Ht 67.0 in | Wt 176.5 lb

## 2020-03-18 DIAGNOSIS — R079 Chest pain, unspecified: Secondary | ICD-10-CM | POA: Diagnosis not present

## 2020-03-18 NOTE — Patient Instructions (Signed)
Medication Instructions:  Your physician recommends that you continue on your current medications as directed. Please refer to the Current Medication list given to you today.  *If you need a refill on your cardiac medications before your next appointment, please call your pharmacy*   Lab Work: None ordered If you have labs (blood work) drawn today and your tests are completely normal, you will receive your results only by: . MyChart Message (if you have MyChart) OR . A paper copy in the mail If you have any lab test that is abnormal or we need to change your treatment, we will call you to review the results.   Testing/Procedures: None ordered   Follow-Up: At [facility], you and your health needs are our priority.  As part of our continuing mission to provide you with exceptional heart care, we have created designated Provider Care Teams.  These Care Teams include your primary Cardiologist (physician) and Advanced Practice Providers (APPs -  Physician Assistants and Nurse Practitioners) who all work together to provide you with the care you need, when you need it.  We recommend signing up for the patient portal called "MyChart".  Sign up information is provided on this After Visit Summary.  MyChart is used to connect with patients for Virtual Visits (Telemedicine).  Patients are able to view lab/test results, encounter notes, upcoming appointments, etc.  Non-urgent messages can be sent to your provider as well.   To learn more about what you can do with MyChart, go to [email or web address].    Your next appointment:   As needed   The format for your next appointment:   In Person  Provider:    You may see Brian Agbor-Etang, MD   or one of the following Advanced Practice Providers on your designated Care Team:    Christopher Berge, NP  Ryan Dunn, PA-C  Jacquelyn Visser, PA-C    Other Instructions N/A  

## 2020-03-18 NOTE — Progress Notes (Signed)
Cardiology Office Note:    Date:  03/18/2020   ID:  Abigail Kramer, DOB 11-06-1966, MRN [id number]  PCP:  Abner Greenspan, MD  Cardiologist:  Kate Sable, MD  Electrophysiologist:  None   Referring MD: Abner Greenspan, MD   Chief Complaint  Patient presents with  . other    Follow up cardiac CT. Meds reviewed by the pt. verbally. Pt. c/o chest pain and shortness of breath.     History of Present Illness:    Abigail Kramer is a 54 y.o. female with a hx of GERD, presenting for follow-up.  She was last seen with chest pain and shortness of breath.  Last echocardiogram showed normal systolic function with normal filling pressures.  She complains of atypical chest discomfort.  Coronary CTA was ordered and patient now presents for results.  She states having occasional chest pains sometimes at night.  Had a recent mammogram which showed a left breast cyst.  Unsure if symptoms are secondary to that.   Past Medical History:  Diagnosis Date  . Fibroid 01/2009   small  . HSV infection    oral - treated by dentist   . Hx: UTI (urinary tract infection)   . Irregular menses   . Ovarian cyst 01/2009 and 04/2009    Past Surgical History:  Procedure Laterality Date  . PARTIAL HYSTERECTOMY  07/2009   1 tube removed  . TUBAL LIGATION      Current Medications: Current Meds  Medication Sig  . albuterol (VENTOLIN HFA) 108 (90 Base) MCG/ACT inhaler Inhale 2 puffs into the lungs every 4 (four) hours as needed for wheezing or shortness of breath.  Marland Kitchen buPROPion (WELLBUTRIN XL) 150 MG 24 hr tablet TAKE 1 TABLET (150 MG TOTAL) BY MOUTH DAILY.  . metoprolol tartrate (LOPRESSOR) 100 MG tablet Take 1 tablet (100 mg) x 1 dose, 2 hours prior to your Cardiac CT  . Progesterone Micronized (PROGESTERONE PO) Take 50 mg by mouth at bedtime.  . valACYclovir (VALTREX) 1000 MG tablet Take 1,000 mg by mouth daily.      Allergies:   Prozac [fluoxetine hcl]   Social History   Socioeconomic History  .  Marital status: Married    Spouse name: Not on file  . Number of children: 2  . Years of education: Not on file  . Highest education level: Not on file  Occupational History  . Occupation: Sales  Tobacco Use  . Smoking status: Never Smoker  . Smokeless tobacco: Never Used  Substance and Sexual Activity  . Alcohol use: Yes    Alcohol/week: 0.0 standard drinks    Comment: occassionally  . Drug use: No  . Sexual activity: Not on file  Other Topics Concern  . Not on file  Social History Narrative  . Not on file   Social Determinants of Health   Financial Resource Strain:   . Difficulty of Paying Living Expenses:   Food Insecurity:   . Worried About Charity fundraiser in the Last Year:   . Arboriculturist in the Last Year:   Transportation Needs:   . Film/video editor (Medical):   Marland Kitchen Lack of Transportation (Non-Medical):   Physical Activity:   . Days of Exercise per Week:   . Minutes of Exercise per Session:   Stress:   . Feeling of Stress :   Social Connections:   . Frequency of Communication with Friends and Family:   . Frequency of Social Gatherings  with Friends and Family:   . Attends Religious Services:   . Active Member of Clubs or Organizations:   . Attends Archivist Meetings:   Marland Kitchen Marital Status:      Family History: The patient's family history includes Breast cancer in her mother; Colon polyps in her mother; Hypertension in her brother and mother.  ROS:   Please see the history of present illness.     All other systems reviewed and are negative.  EKGs/Labs/Other Studies Reviewed:    The following studies were reviewed today:   EKG:  EKG is  ordered today.  The ekg ordered today demonstrates normal sinus rhythm, normal ECG.  Recent Labs: 05/09/2019: ALT 14; Hemoglobin 13.3; Platelets 300.0; TSH 2.21 03/07/2020: BUN 19; Creatinine, Ser 0.87; Potassium 3.9; Sodium 138  Recent Lipid Panel    Component Value Date/Time   CHOL 238 (H)  05/09/2019 0900   TRIG 107.0 05/09/2019 0900   HDL 102.00 05/09/2019 0900   CHOLHDL 2 05/09/2019 0900   VLDL 21.4 05/09/2019 0900   LDLCALC 115 (H) 05/09/2019 0900   LDLDIRECT 99.3 12/18/2013 0758    Physical Exam:    VS:  BP 100/70 (BP Location: Left Arm, Patient Position: Sitting, Cuff Size: Normal)   Pulse 72   Ht 5\' 7"  (1.702 m)   Wt 176 lb 8 oz (80.1 kg)   LMP 12/20/2008   SpO2 98%   BMI 27.64 kg/m     Wt Readings from Last 3 Encounters:  03/18/20 176 lb 8 oz (80.1 kg)  02/08/20 183 lb 4 oz (83.1 kg)  01/08/20 180 lb 8 oz (81.9 kg)     GEN:  Well nourished, well developed in no acute distress HEENT: Normal NECK: No JVD; No carotid bruits LYMPHATICS: No lymphadenopathy CARDIAC: RRR, no murmurs, rubs, gallops RESPIRATORY:  Clear to auscultation without rales, wheezing or rhonchi  ABDOMEN: Soft, non-tender, non-distended MUSCULOSKELETAL:  No edema; No deformity  SKIN: Warm and dry NEUROLOGIC:  Alert and oriented x 3 PSYCHIATRIC:  Normal affect   ASSESSMENT:    1. Chest pain, unspecified type    PLAN:    In order of problems listed above:  1. Symptoms of chest pain are atypical.  Coronary CTA showed a calcium score of 0, no evidence of CAD, CAD RADS 0.  Patient reassured.  Prior echocardiogram showed EF of 50 to 55%.  Not sure if breasts cyst is contributing to symptoms.  Patient advised to follow-up with primary care regarding this.  Noninvasive testing so far has not revealed any cardiac etiology.  Follow-up as needed.  Total encounter time 35 minutes.  Time spent educating patient on value of calcium score, coronary CT angiogram.  Explaining risk prognostication for a calcium score of 0.  Greater than 50% was spent in counseling and coordination of care with the patient   This note was generated in part or whole with voice recognition software. Voice recognition is usually quite accurate but there are transcription errors that can and very often do occur. I  apologize for any typographical errors that were not detected and corrected.  Medication Adjustments/Labs and Tests Ordered: Current medicines are reviewed at length with the patient today.  Concerns regarding medicines are outlined above.  Orders Placed This Encounter  Procedures  . EKG 12-Lead   No orders of the defined types were placed in this encounter.   Patient Instructions  Medication Instructions:  Your physician recommends that you continue on your current medications as directed.  Please refer to the Current Medication list given to you today.  *If you need a refill on your cardiac medications before your next appointment, please call your pharmacy*   Lab Work: None ordered If you have labs (blood work) drawn today and your tests are completely normal, you will receive your results only by: Marland Kitchen MyChart Message (if you have MyChart) OR . A paper copy in the mail If you have any lab test that is abnormal or we need to change your treatment, we will call you to review the results.   Testing/Procedures: None ordered   Follow-Up: At [facility], you and your health needs are our priority.  As part of our continuing mission to provide you with exceptional heart care, we have created designated Provider Care Teams.  These Care Teams include your primary Cardiologist (physician) and Advanced Practice Providers (APPs -  Physician Assistants and Nurse Practitioners) who all work together to provide you with the care you need, when you need it.  We recommend signing up for the patient portal called "MyChart".  Sign up information is provided on this After Visit Summary.  MyChart is used to connect with patients for Virtual Visits (Telemedicine).  Patients are able to view lab/test results, encounter notes, upcoming appointments, etc.  Non-urgent messages can be sent to your provider as well.   To learn more about what you can do with MyChart, go to [email or web address].    Your  next appointment:   As needed   The format for your next appointment:   In Person  Provider:    You may see Kate Sable, MD or one of the following Advanced Practice Providers on your designated Care Team:    Murray Hodgkins, NP  Christell Faith, PA-C  Marrianne Mood, PA-C    Other Instructions N/A     Signed, Kate Sable, MD  03/18/2020 10:19 AM    [place]

## 2020-05-11 ENCOUNTER — Telehealth: Payer: Self-pay | Admitting: Family Medicine

## 2020-05-11 DIAGNOSIS — Z0000 Encounter for general adult medical examination without abnormal findings: Secondary | ICD-10-CM

## 2020-05-11 NOTE — Telephone Encounter (Signed)
-----   Message from Cloyd Stagers, RT sent at 04/29/2020  2:37 PM EDT ----- Regarding: Lab Orders for Tuesday 5.25.2021 Please place lab orders for Tuesday 5.25.2021, office visit for physical on Tuesday 6.1.2021 Thank you, Dyke Maes RT(R)

## 2020-05-13 ENCOUNTER — Other Ambulatory Visit (INDEPENDENT_AMBULATORY_CARE_PROVIDER_SITE_OTHER): Payer: BC Managed Care – PPO

## 2020-05-13 DIAGNOSIS — Z0000 Encounter for general adult medical examination without abnormal findings: Secondary | ICD-10-CM

## 2020-05-13 LAB — CBC WITH DIFFERENTIAL/PLATELET
Basophils Absolute: 0.1 10*3/uL (ref 0.0–0.1)
Basophils Relative: 0.8 % (ref 0.0–3.0)
Eosinophils Absolute: 0.3 10*3/uL (ref 0.0–0.7)
Eosinophils Relative: 4.3 % (ref 0.0–5.0)
HCT: 38.6 % (ref 36.0–46.0)
Hemoglobin: 12.9 g/dL (ref 12.0–15.0)
Lymphocytes Relative: 44.9 % (ref 12.0–46.0)
Lymphs Abs: 2.8 10*3/uL (ref 0.7–4.0)
MCHC: 33.4 g/dL (ref 30.0–36.0)
MCV: 89.5 fl (ref 78.0–100.0)
Monocytes Absolute: 0.4 10*3/uL (ref 0.1–1.0)
Monocytes Relative: 6.2 % (ref 3.0–12.0)
Neutro Abs: 2.7 10*3/uL (ref 1.4–7.7)
Neutrophils Relative %: 43.8 % (ref 43.0–77.0)
Platelets: 251 10*3/uL (ref 150.0–400.0)
RBC: 4.32 Mil/uL (ref 3.87–5.11)
RDW: 14.6 % (ref 11.5–15.5)
WBC: 6.2 10*3/uL (ref 4.0–10.5)

## 2020-05-13 LAB — LIPID PANEL
Cholesterol: 213 mg/dL — ABNORMAL HIGH (ref 0–200)
HDL: 93.2 mg/dL (ref >39.00)
LDL Cholesterol: 102 mg/dL — ABNORMAL HIGH (ref 0–99)
NonHDL: 119.8
Total CHOL/HDL Ratio: 2
Triglycerides: 91 mg/dL (ref 0.0–149.0)
VLDL: 18.2 mg/dL (ref 0.0–40.0)

## 2020-05-13 LAB — COMPREHENSIVE METABOLIC PANEL
ALT: 19 U/L (ref 0–35)
AST: 19 U/L (ref 0–37)
Albumin: 4.3 g/dL (ref 3.5–5.2)
Alkaline Phosphatase: 74 U/L (ref 39–117)
BUN: 16 mg/dL (ref 6–23)
CO2: 30 mEq/L (ref 19–32)
Calcium: 9.2 mg/dL (ref 8.4–10.5)
Chloride: 102 mEq/L (ref 96–112)
Creatinine, Ser: 0.85 mg/dL (ref 0.40–1.20)
GFR: 69.51 mL/min (ref >60.00)
Glucose, Bld: 96 mg/dL (ref 70–99)
Potassium: 4.2 mEq/L (ref 3.5–5.1)
Sodium: 138 mEq/L (ref 135–145)
Total Bilirubin: 0.5 mg/dL (ref 0.2–1.2)
Total Protein: 7 g/dL (ref 6.0–8.3)

## 2020-05-13 LAB — TSH: TSH: 2.86 u[IU]/mL (ref 0.35–4.50)

## 2020-05-20 ENCOUNTER — Other Ambulatory Visit: Payer: Self-pay

## 2020-05-20 ENCOUNTER — Encounter: Payer: Self-pay | Admitting: Family Medicine

## 2020-05-20 ENCOUNTER — Ambulatory Visit (INDEPENDENT_AMBULATORY_CARE_PROVIDER_SITE_OTHER): Payer: BC Managed Care – PPO | Admitting: Family Medicine

## 2020-05-20 VITALS — BP 139/77 | HR 94 | Temp 97.1°F | Ht 67.0 in | Wt 173.6 lb

## 2020-05-20 DIAGNOSIS — Z0000 Encounter for general adult medical examination without abnormal findings: Secondary | ICD-10-CM | POA: Diagnosis not present

## 2020-05-20 DIAGNOSIS — R030 Elevated blood-pressure reading, without diagnosis of hypertension: Secondary | ICD-10-CM | POA: Insufficient documentation

## 2020-05-20 DIAGNOSIS — F411 Generalized anxiety disorder: Secondary | ICD-10-CM

## 2020-05-20 MED ORDER — BUPROPION HCL ER (XL) 150 MG PO TB24: ORAL_TABLET | ORAL | 11 refills | Status: DC

## 2020-05-20 MED ORDER — OMEPRAZOLE 20 MG PO CPDR: 20.0000 mg | DELAYED_RELEASE_CAPSULE | Freq: Every day | ORAL | 11 refills | Status: DC

## 2020-05-20 NOTE — Assessment & Plan Note (Signed)
Reviewed health habits including diet and exercise and skin cancer prevention Reviewed appropriate screening tests for age  Also reviewed health mt list, fam hx and immunization status , as well as social and family history   See HPI Labs reviewed  Off HRT now  Has 6 mo re check mammogram planned /continues self exam  Fair mood-continues wellbutrin bp in 2nd check today is borderline-will watch it  Due for 10 y recall colonoscopy (reminder letter was seen in chart from GI)

## 2020-05-20 NOTE — Patient Instructions (Addendum)
If you are interested in the shingles vaccine series (Shingrix), call your insurance or pharmacy to check on coverage and location it must be given.  If affordable - you can schedule it here or at your pharmacy depending on coverage   For blood pressure-work on healthy diet/exercise  Try to get most of your carbohydrates from produce (with the exception of white potatoes)  Eat less bread/pasta/rice/snack foods/cereals/sweets and other items from the middle of the grocery store (processed carbs)  See handouts  Let's re check in 6 months

## 2020-05-20 NOTE — Assessment & Plan Note (Signed)
Per pt fairly stable despite stopping HRT and stressors  Continues wellbutrin  Would like to stop later when stress is decreased

## 2020-05-20 NOTE — Progress Notes (Signed)
Subjective:    Patient ID: Abigail Kramer, female    DOB: Jun 03, 1966, 54 y.o.   MRN: [id number]  This visit occurred during the SARS-CoV-2 public health emergency.  Safety protocols were in place, including screening questions prior to the visit, additional usage of staff PPE, and extensive cleaning of exam room while observing appropriate contact time as indicated for disinfecting solutions.    HPI Here for health maintenance exam and to review chronic medical problems    Wt Readings from Last 3 Encounters:  05/20/20 173 lb 9 oz (78.7 kg)  03/18/20 176 lb 8 oz (80.1 kg)  02/08/20 183 lb 4 oz (83.1 kg)   27.18 kg/m   Has been feeling pretty good overall  Exercise-working hard in the yard/outdoor work  Using [employer] program    covid status -had the virus in early December Was immunized in march/april  Her sob is improving - gradually (had a big work up for this and chest pain)  Very infrequent cp    Pap1/09-hysterectomy  Hormone status - was on bio identical hormones and now stopped them  A lot of menopause symptoms now (night sweats)   Colonoscopy 3/11 -10 y recall  Flu shot 9/20 Tdap 7/13  Mammogram 2/21-has early check pending for opacity  Self breast exam- no lumps   gen anxiety disorder  Takes wellbutrin xl 150 mg  Mood has been fair/stable  (hormone changes affect it)   BP Readings from Last 3 Encounters:  05/20/20 (!) 144/78  03/18/20 100/70  03/13/20 125/73   Her bp has been mildly elevated at home (once in 123456 systolic)- with covid  2nd check today BP: 139/77    Pulse Readings from Last 3 Encounters:  05/20/20 94  03/18/20 72  03/13/20 (!) 58   Cholesterol Lab Results  Component Value Date   CHOL 213 (H) 05/13/2020   CHOL 238 (H) 05/09/2019   CHOL 211 (H) 03/23/2018   Lab Results  Component Value Date   HDL 93.20 05/13/2020   HDL 102.00 05/09/2019   HDL 100.20 03/23/2018   Lab Results  Component Value Date   LDLCALC 102 (H)  05/13/2020   LDLCALC 115 (H) 05/09/2019   LDLCALC 97 03/23/2018   Lab Results  Component Value Date   TRIG 91.0 05/13/2020   TRIG 107.0 05/09/2019   TRIG 68.0 03/23/2018   Lab Results  Component Value Date   CHOLHDL 2 05/13/2020   CHOLHDL 2 05/09/2019   CHOLHDL 2 03/23/2018   Lab Results  Component Value Date   LDLDIRECT 99.3 12/18/2013   LDLDIRECT 90.3 01/17/2009  eating well /weight watchers    Other labs Lab Results  Component Value Date   CREATININE 0.85 05/13/2020   BUN 16 05/13/2020   NA 138 05/13/2020   K 4.2 05/13/2020   CL 102 05/13/2020   CO2 30 05/13/2020   Lab Results  Component Value Date   WBC 6.2 05/13/2020   HGB 12.9 05/13/2020   HCT 38.6 05/13/2020   MCV 89.5 05/13/2020   PLT 251.0 05/13/2020   Lab Results  Component Value Date   TSH 2.86 05/13/2020   glucose 96  Patient Active Problem List   Diagnosis Date Noted  . Elevated BP without diagnosis of hypertension 05/20/2020  . Positive ANA (antinuclear antibody) 01/26/2017  . Generalized anxiety disorder 10/22/2015  . Mouth ulcer 10/18/2014  . HSV (herpes simplex virus) infection 10/18/2014  . Raynaud phenomenon 10/18/2014  . Symptoms, such as flushing, sleeplessness,  headache, lack of concentration, associated with the menopause 12/25/2013  . Routine general medical examination at a health care facility 04/08/2011   Past Medical History:  Diagnosis Date  . Fibroid 01/2009   small  . HSV infection    oral - treated by dentist   . Hx: UTI (urinary tract infection)   . Irregular menses   . Ovarian cyst 01/2009 and 04/2009   Past Surgical History:  Procedure Laterality Date  . PARTIAL HYSTERECTOMY  07/2009   1 tube removed  . TUBAL LIGATION     Social History   Tobacco Use  . Smoking status: Never Smoker  . Smokeless tobacco: Never Used  Substance Use Topics  . Alcohol use: Yes    Alcohol/week: 0.0 standard drinks    Comment: occassionally  . Drug use: No   Family History    Problem Relation Age of Onset  . Hypertension Mother   . Colon polyps Mother   . Breast cancer Mother   . Hypertension Brother    Allergies  Allergen Reactions  . Prozac [Fluoxetine Hcl]     Sluggishness/dismotivation   Current Outpatient Medications on File Prior to Visit  Medication Sig Dispense Refill  . Progesterone Micronized (PROGESTERONE PO) Take 50 mg by mouth at bedtime.    . valACYclovir (VALTREX) 1000 MG tablet Take 1,000 mg by mouth daily.      No current facility-administered medications on file prior to visit.     Review of Systems  Constitutional: Negative for activity change, appetite change, fatigue, fever and unexpected weight change.  HENT: Negative for congestion, ear pain, rhinorrhea, sinus pressure and sore throat.   Eyes: Negative for pain, redness and visual disturbance.  Respiratory: Negative for cough, shortness of breath and wheezing.   Cardiovascular: Negative for chest pain and palpitations.  Gastrointestinal: Negative for abdominal pain, blood in stool, constipation and diarrhea.  Endocrine: Negative for polydipsia and polyuria.  Genitourinary: Negative for dysuria, frequency and urgency.       Night sweats-off of HRT  Musculoskeletal: Negative for arthralgias, back pain and myalgias.  Skin: Negative for pallor and rash.  Allergic/Immunologic: Negative for environmental allergies.  Neurological: Negative for dizziness, syncope and headaches.  Hematological: Negative for adenopathy. Does not bruise/bleed easily.  Psychiatric/Behavioral: Negative for decreased concentration and dysphoric mood. The patient is not nervous/anxious.        Objective:   Physical Exam Constitutional:      General: She is not in acute distress.    Appearance: Normal appearance. She is well-developed and normal weight. She is not ill-appearing or diaphoretic.  HENT:     Head: Normocephalic and atraumatic.     Right Ear: Tympanic membrane, ear canal and external ear  normal.     Left Ear: Tympanic membrane, ear canal and external ear normal.     Nose: Nose normal. No congestion.     Mouth/Throat:     Mouth: Mucous membranes are moist.     Pharynx: Oropharynx is clear. No posterior oropharyngeal erythema.  Eyes:     General: No scleral icterus.    Extraocular Movements: Extraocular movements intact.     Conjunctiva/sclera: Conjunctivae normal.     Pupils: Pupils are equal, round, and reactive to light.  Neck:     Thyroid: No thyromegaly.     Vascular: No carotid bruit or JVD.  Cardiovascular:     Rate and Rhythm: Normal rate and regular rhythm.     Pulses: Normal pulses.  Heart sounds: Normal heart sounds. No gallop.   Pulmonary:     Effort: Pulmonary effort is normal. No respiratory distress.     Breath sounds: Normal breath sounds. No wheezing.     Comments: Good air exch Chest:     Chest wall: No tenderness.  Abdominal:     General: Bowel sounds are normal. There is no distension or abdominal bruit.     Palpations: Abdomen is soft. There is no mass.     Tenderness: There is no abdominal tenderness.     Hernia: No hernia is present.  Genitourinary:    Comments: Breast exam: No mass, nodules, thickening, tenderness, bulging, retraction, inflamation, nipple discharge or skin changes noted.  No axillary or clavicular LA.     Musculoskeletal:        General: No tenderness. Normal range of motion.     Cervical back: Normal range of motion and neck supple. No rigidity. No muscular tenderness.     Right lower leg: No edema.     Left lower leg: No edema.  Lymphadenopathy:     Cervical: No cervical adenopathy.  Skin:    General: Skin is warm and dry.     Coloration: Skin is not pale.     Findings: No erythema or rash.     Comments: Solar lentigines diffusely tanned  Neurological:     Mental Status: She is alert. Mental status is at baseline.     Cranial Nerves: No cranial nerve deficit.     Motor: No abnormal muscle tone.      Coordination: Coordination normal.     Gait: Gait normal.     Deep Tendon Reflexes: Reflexes are normal and symmetric. Reflexes normal.  Psychiatric:        Mood and Affect: Mood normal.        Cognition and Memory: Cognition and memory normal.     Comments: pleasant           Assessment & Plan:   Problem List Items Addressed This Visit      Other   Routine general medical examination at a health care facility - Primary    Reviewed health habits including diet and exercise and skin cancer prevention Reviewed appropriate screening tests for age  Also reviewed health mt list, fam hx and immunization status , as well as social and family history   See HPI Labs reviewed  Off HRT now  Has 6 mo re check mammogram planned /continues self exam  Fair mood-continues wellbutrin bp in 2nd check today is borderline-will watch it  Due for 35 y recall colonoscopy (reminder letter was seen in chart from GI)       Generalized anxiety disorder    Per pt fairly stable despite stopping HRT and stressors  Continues wellbutrin  Would like to stop later when stress is decreased       Relevant Medications   buPROPion (WELLBUTRIN XL) 150 MG 24 hr tablet   Elevated BP without diagnosis of hypertension    BP: 139/77  Pos fam hx for HTN   Disc ways to delay this in herself-incl healthy diet and exercise  Given handouts re: dash eating plan and HTN  Will f/u in 6 mo to re check

## 2020-05-20 NOTE — Assessment & Plan Note (Signed)
BP: 139/77  Pos fam hx for HTN   Disc ways to delay this in herself-incl healthy diet and exercise  Given handouts re: dash eating plan and HTN  Will f/u in 6 mo to re check

## 2020-05-21 ENCOUNTER — Telehealth: Payer: Self-pay | Admitting: *Deleted

## 2020-05-21 NOTE — Telephone Encounter (Signed)
-----   Message from Abner Greenspan, MD sent at 05/20/2020  7:23 PM EDT ----- I forgot to mention at her visit that colonoscopy is due (looks like GI did send her a letter in march)Please remind her to schedule thisThanks

## 2020-05-21 NOTE — Telephone Encounter (Signed)
Left VM letting pt know Dr. Marliss Coots comment regarding scheduling her colonoscopy

## 2020-06-17 NOTE — Telephone Encounter (Signed)
Pt had visit 12/25/19.

## 2020-08-28 ENCOUNTER — Encounter: Payer: Self-pay | Admitting: Family Medicine

## 2020-08-28 DIAGNOSIS — N6321 Unspecified lump in the left breast, upper outer quadrant: Secondary | ICD-10-CM | POA: Diagnosis not present

## 2020-09-15 DIAGNOSIS — E039 Hypothyroidism, unspecified: Secondary | ICD-10-CM | POA: Diagnosis not present

## 2020-09-15 DIAGNOSIS — N951 Menopausal and female climacteric states: Secondary | ICD-10-CM | POA: Diagnosis not present

## 2020-09-15 DIAGNOSIS — R6882 Decreased libido: Secondary | ICD-10-CM | POA: Diagnosis not present

## 2020-09-15 DIAGNOSIS — R5383 Other fatigue: Secondary | ICD-10-CM | POA: Diagnosis not present

## 2020-09-17 DIAGNOSIS — N958 Other specified menopausal and perimenopausal disorders: Secondary | ICD-10-CM | POA: Diagnosis not present

## 2020-09-17 DIAGNOSIS — R232 Flushing: Secondary | ICD-10-CM | POA: Diagnosis not present

## 2020-09-17 DIAGNOSIS — R6882 Decreased libido: Secondary | ICD-10-CM | POA: Diagnosis not present

## 2020-11-18 DIAGNOSIS — E039 Hypothyroidism, unspecified: Secondary | ICD-10-CM | POA: Diagnosis not present

## 2020-11-18 DIAGNOSIS — N958 Other specified menopausal and perimenopausal disorders: Secondary | ICD-10-CM | POA: Diagnosis not present

## 2020-11-19 ENCOUNTER — Other Ambulatory Visit: Payer: Self-pay

## 2020-11-19 ENCOUNTER — Ambulatory Visit (INDEPENDENT_AMBULATORY_CARE_PROVIDER_SITE_OTHER): Payer: BC Managed Care – PPO | Admitting: Family Medicine

## 2020-11-19 ENCOUNTER — Encounter: Payer: Self-pay | Admitting: Family Medicine

## 2020-11-19 VITALS — BP 132/85 | HR 64 | Temp 96.9°F | Wt 170.5 lb

## 2020-11-19 DIAGNOSIS — R030 Elevated blood-pressure reading, without diagnosis of hypertension: Secondary | ICD-10-CM

## 2020-11-19 DIAGNOSIS — Z23 Encounter for immunization: Secondary | ICD-10-CM | POA: Diagnosis not present

## 2020-11-19 NOTE — Assessment & Plan Note (Signed)
BP is improved today  Disc fam hx of HTN  BP: 132/85   Rev recommendation for low sodium eating and more exercise  Will continue to monitor

## 2020-11-19 NOTE — Patient Instructions (Signed)
Blood pressure looks good today  We will continue to watch it   Less processed foods and sodium whenever possible   Try adding exercise   Flu shot today   Take care of yourself

## 2020-11-19 NOTE — Progress Notes (Signed)
Subjective:    Patient ID: Abigail Kramer, female    DOB: [birth year], 54 y.o.   MRN: [id number]  This visit occurred during the SARS-CoV-2 public health emergency.  Safety protocols were in place, including screening questions prior to the visit, additional usage of staff PPE, and extensive cleaning of exam room while observing appropriate contact time as indicated for disinfecting solutions.    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 Due to menopause   Last visit pt had elevated bp (fam hx of HTN)  BP Readings from Last 3 Encounters:  11/19/20 126/78  05/20/20 139/77  03/18/20 100/70   Pulse Readings from Last 3 Encounters:  11/19/20 64  05/20/20 94  03/18/20 72   She was checking bp at home for a while  Not now -was doing ok   occ feels like her heart beats faster No headache   Wants to start exercising  Had equip in the office  Likes to use a trampoline and total gym    Patient Active Problem List   Diagnosis Date Noted  . Elevated BP without diagnosis of hypertension 05/20/2020  . Positive ANA (antinuclear antibody) 01/26/2017  . Generalized anxiety disorder 10/22/2015  . Mouth ulcer 10/18/2014  . HSV (herpes simplex virus) infection 10/18/2014  . Raynaud phenomenon 10/18/2014  . Symptoms, such as flushing, sleeplessness, headache, lack of concentration, associated with the menopause 12/25/2013  . Routine general medical examination at a health care facility 04/08/2011   Past Medical History:  Diagnosis Date  . Fibroid 01/2009   small  . HSV infection    oral - treated by dentist   . Hx: UTI (urinary tract infection)   . Irregular menses   . Ovarian cyst 01/2009 and 04/2009   Past  Surgical History:  Procedure Laterality Date  . PARTIAL HYSTERECTOMY  07/2009   1 tube removed  . TUBAL LIGATION     Social History   Tobacco Use  . Smoking status: Never Smoker  . Smokeless tobacco: Never Used  Vaping Use  . Vaping Use: Never used  Substance Use Topics  . Alcohol use: Yes    Alcohol/week: 0.0 standard drinks    Comment: occassionally  . Drug use: No   Family History  Problem Relation Age of Onset  . Hypertension Mother   . Colon polyps Mother   . Breast cancer Mother   . Hypertension Brother    Allergies  Allergen Reactions  . Prozac [Fluoxetine Hcl]     Sluggishness/dismotivation   Current Outpatient Medications on File Prior to Visit  Medication Sig Dispense Refill  . buPROPion (WELLBUTRIN XL) 150 MG 24 hr tablet TAKE 1 TABLET (150 MG TOTAL) BY MOUTH DAILY. 30 tablet 11  . omeprazole (PRILOSEC) 20 MG capsule Take 1 capsule (20 mg total) by mouth daily. 30 capsule 11  . Progesterone Micronized (PROGESTERONE PO) Take 50 mg by mouth at bedtime.    . valACYclovir (VALTREX) 1000 MG tablet Take 1,000 mg by mouth daily.      No current facility-administered medications on file prior to visit.    Review of Systems  Constitutional:  Positive for appetite change. Negative for activity change, fatigue, fever and unexpected weight change.  HENT: Negative for congestion, ear pain, rhinorrhea, sinus pressure and sore throat.   Eyes: Negative for pain, redness and visual disturbance.  Respiratory: Negative for cough, shortness of breath and wheezing.   Cardiovascular: Negative for chest pain and palpitations.  Gastrointestinal: Negative for abdominal pain, blood in stool, constipation and diarrhea.  Endocrine: Negative for polydipsia and polyuria.       Menopause symptoms  Genitourinary: Negative for dysuria, frequency and urgency.  Musculoskeletal: Negative for arthralgias, back pain and myalgias.  Skin: Negative for pallor and rash.  Allergic/Immunologic:  Negative for environmental allergies.  Neurological: Negative for dizziness, syncope and headaches.  Hematological: Negative for adenopathy. Does not bruise/bleed easily.  Psychiatric/Behavioral: Negative for decreased concentration and dysphoric mood. The patient is not nervous/anxious.        Objective:   Physical Exam Constitutional:      General: She is not in acute distress.    Appearance: Normal appearance. She is well-developed and normal weight.  HENT:     Head: Normocephalic and atraumatic.  Eyes:     Conjunctiva/sclera: Conjunctivae normal.     Pupils: Pupils are equal, round, and reactive to light.  Neck:     Thyroid: No thyromegaly.     Vascular: No carotid bruit or JVD.  Cardiovascular:     Rate and Rhythm: Normal rate and regular rhythm.     Heart sounds: Normal heart sounds. No gallop.   Pulmonary:     Effort: Pulmonary effort is normal. No respiratory distress.     Breath sounds: Normal breath sounds. No wheezing or rales.  Abdominal:     General: There is no abdominal bruit.     Palpations: There is no mass.  Musculoskeletal:     Cervical back: Normal range of motion and neck supple.     Right lower leg: No edema.     Left lower leg: No edema.  Lymphadenopathy:     Cervical: No cervical adenopathy.  Skin:    General: Skin is warm and dry.     Findings: No rash.  Neurological:     Mental Status: She is alert.     Deep Tendon Reflexes: Reflexes are normal and symmetric.  Psychiatric:        Mood and Affect: Mood normal.           Assessment & Plan:   Problem List Items Addressed This Visit      Other   Elevated BP without diagnosis of hypertension - Primary    BP is improved today  Disc fam hx of HTN  BP: 132/85   Rev recommendation for low sodium eating and more exercise  Will continue to monitor

## 2020-11-20 DIAGNOSIS — R232 Flushing: Secondary | ICD-10-CM | POA: Diagnosis not present

## 2020-11-20 DIAGNOSIS — N958 Other specified menopausal and perimenopausal disorders: Secondary | ICD-10-CM | POA: Diagnosis not present

## 2020-11-20 DIAGNOSIS — R5383 Other fatigue: Secondary | ICD-10-CM | POA: Diagnosis not present

## 2021-01-29 ENCOUNTER — Telehealth: Payer: Self-pay

## 2021-01-29 NOTE — Telephone Encounter (Signed)
Vm from pt stating she found a lump in left breast yesterday which is now red and painful.  States she has had cyst in same breast previously so she contacted [facility].  However, cannot be seen until 03/02/21.  Pt thinks it should be checked sooner.  Wants Dr. Marliss Coots advise at [phone].  In the meantime, spoke with pt scheduling OV on 02/02/21 at 9:00.

## 2021-01-29 NOTE — Telephone Encounter (Signed)
Please schedule with first available or if needed Sat clinic or urgent care (I know we are very full today and tomorrow) Please ask if she has ever had a breast infection? Is the lump close to the skin surface or is it draining ?  Thanks  (if infected may need an oral antibiotic)

## 2021-01-29 NOTE — Telephone Encounter (Signed)
appt scheduled with Dr. Diona Browner tomorrow (had a cancellation), pt agrees with plan

## 2021-01-30 ENCOUNTER — Ambulatory Visit (INDEPENDENT_AMBULATORY_CARE_PROVIDER_SITE_OTHER): Payer: BC Managed Care – PPO | Admitting: Family Medicine

## 2021-01-30 ENCOUNTER — Encounter: Payer: Self-pay | Admitting: Family Medicine

## 2021-01-30 ENCOUNTER — Other Ambulatory Visit: Payer: Self-pay

## 2021-01-30 VITALS — BP 112/80 | HR 70 | Temp 97.6°F | Ht 67.0 in | Wt 174.5 lb

## 2021-01-30 DIAGNOSIS — N6320 Unspecified lump in the left breast, unspecified quadrant: Secondary | ICD-10-CM | POA: Diagnosis not present

## 2021-01-30 MED ORDER — CEPHALEXIN 500 MG PO CAPS: 500.0000 mg | ORAL_CAPSULE | Freq: Three times a day (TID) | ORAL | 0 refills | Status: DC

## 2021-01-30 NOTE — Progress Notes (Addendum)
Patient ID: Abigail Kramer, female    DOB: 1966-12-08, 55 y.o.   MRN: [id number]  This visit was conducted in person.  BP 112/80   Pulse 70   Temp 97.6 F (36.4 C) (Temporal)   Ht 5\' 7"  (1.702 m)   Wt 174 lb 8 oz (79.2 kg)   LMP 12/20/2008   SpO2 97%   BMI 27.33 kg/m    CC:  Chief Complaint  Patient presents with  . Breast Mass    Left    Subjective:   HPI: Abigail Kramer is a 55 y.o. female presenting on 01/30/2021 for Breast Mass (Left)   She noted a lump in left breat 2 days ago. Feels warm and slightly red. No drainage.   Area is sore to touch. No change in size. She has used aspirin to treat... helped.  In last year in same spot she had a cyst... resolved on its own.  Last mammogram 02/2020 showed  Clustered cysts in left breast.. repeat US 08/28/20.. showed shrinking cyst at 2 PM.  Mother with breast cancer.   Relevant past medical, surgical, family and social history reviewed and updated as indicated. Interim medical history since our last visit reviewed. Allergies and medications reviewed and updated. Outpatient Medications Prior to Visit  Medication Sig Dispense Refill  . buPROPion (WELLBUTRIN XL) 150 MG 24 hr tablet TAKE 1 TABLET (150 MG TOTAL) BY MOUTH DAILY. 30 tablet 11  . omeprazole (PRILOSEC) 20 MG capsule Take 1 capsule (20 mg total) by mouth daily. 30 capsule 11  . Progesterone Micronized (PROGESTERONE PO) Take 50 mg by mouth at bedtime.    . valACYclovir (VALTREX) 500 MG tablet Take 500 mg by mouth daily.    . valACYclovir (VALTREX) 1000 MG tablet Take 1,000 mg by mouth daily.      No facility-administered medications prior to visit.     Per HPI unless specifically indicated in ROS section below Review of Systems  Constitutional: Negative for fatigue and fever.  HENT: Negative for ear pain.   Eyes: Negative for pain.  Respiratory: Negative for chest tightness and shortness of breath.   Cardiovascular: Negative for chest pain, palpitations  and leg swelling.  Gastrointestinal: Negative for abdominal pain.  Genitourinary: Negative for dysuria.   Objective:  BP 112/80   Pulse 70   Temp 97.6 F (36.4 C) (Temporal)   Ht 5\' 7"  (1.702 m)   Wt 174 lb 8 oz (79.2 kg)   LMP 12/20/2008   SpO2 97%   BMI 27.33 kg/m   Wt Readings from Last 3 Encounters:  01/30/21 174 lb 8 oz (79.2 kg)  11/19/20 170 lb 8 oz (77.3 kg)  05/20/20 173 lb 9 oz (78.7 kg)      Physical Exam Constitutional:      General: She is not in acute distress.Vital signs are normal.     Appearance: Normal appearance. She is well-developed and well-nourished. She is not ill-appearing or toxic-appearing.  HENT:     Head: Normocephalic.     Right Ear: Hearing, tympanic membrane, ear canal and external ear normal. Tympanic membrane is not erythematous, retracted or bulging.     Left Ear: Hearing, tympanic membrane, ear canal and external ear normal. Tympanic membrane is not erythematous, retracted or bulging.     Nose: No mucosal edema or rhinorrhea.     Right Sinus: No maxillary sinus tenderness or frontal sinus tenderness.     Left Sinus: No maxillary sinus tenderness or frontal  sinus tenderness.     Mouth/Throat:     Mouth: Oropharynx is clear and moist and mucous membranes are normal.     Pharynx: Uvula midline.  Eyes:     General: Lids are normal. Lids are everted, no foreign bodies appreciated.     Extraocular Movements: EOM normal.     Conjunctiva/sclera: Conjunctivae normal.     Pupils: Pupils are equal, round, and reactive to light.  Neck:     Thyroid: No thyroid mass or thyromegaly.     Vascular: No carotid bruit.     Trachea: Trachea normal.  Cardiovascular:     Rate and Rhythm: Normal rate and regular rhythm.     Pulses: Normal pulses and intact distal pulses.     Heart sounds: Normal heart sounds, S1 normal and S2 normal. No murmur heard. No friction rub. No gallop.   Pulmonary:     Effort: Pulmonary effort is normal. No tachypnea or  respiratory distress.     Breath sounds: Normal breath sounds. No decreased breath sounds, wheezing, rhonchi or rales.  Chest:  Breasts:     Right: Normal. No axillary adenopathy or supraclavicular adenopathy.     Left: Mass, skin change and tenderness present. No inverted nipple, axillary adenopathy or supraclavicular adenopathy.        Comments: Well circumscribed breast mass, with redness and warmth at skin , mobile, ttp at 3 oclock Abdominal:     General: Bowel sounds are normal.     Palpations: Abdomen is soft.     Tenderness: There is no abdominal tenderness.  Musculoskeletal:     Cervical back: Normal range of motion and neck supple.  Lymphadenopathy:     Upper Body:     Right upper body: No supraclavicular, axillary or pectoral adenopathy.     Left upper body: No supraclavicular, axillary or pectoral adenopathy.  Skin:    General: Skin is warm, dry and intact.     Findings: No rash.  Neurological:     Mental Status: She is alert.  Psychiatric:        Mood and Affect: Mood is not anxious or depressed.        Speech: Speech normal.        Behavior: Behavior normal. Behavior is cooperative.        Thought Content: Thought content normal.        Cognition and Memory: Cognition and memory normal.        Judgment: Judgment normal.       Results for orders placed or performed in visit on 05/13/20  TSH  Result Value Ref Range   TSH 2.86 0.35 - 4.50 uIU/mL  Lipid panel  Result Value Ref Range   Cholesterol 213 (H) 0 - 200 mg/dL   Triglycerides 91.0 0.0 - 149.0 mg/dL   HDL 93.20 >39.00 mg/dL   VLDL 18.2 0.0 - 40.0 mg/dL   LDL Cholesterol 102 (H) 0 - 99 mg/dL   Total CHOL/HDL Ratio 2    NonHDL 119.80   CBC with Differential/Platelet  Result Value Ref Range   WBC 6.2 4.0 - 10.5 K/uL   RBC 4.32 3.87 - 5.11 Mil/uL   Hemoglobin 12.9 12.0 - 15.0 g/dL   HCT 38.6 36.0 - 46.0 %   MCV 89.5 78.0 - 100.0 fl   MCHC 33.4 30.0 - 36.0 g/dL   RDW 14.6 11.5 - 15.5 %   Platelets  251.0 150.0 - 400.0 K/uL   Neutrophils Relative % 43.8 43.0 -  77.0 %   Lymphocytes Relative 44.9 12.0 - 46.0 %   Monocytes Relative 6.2 3.0 - 12.0 %   Eosinophils Relative 4.3 0.0 - 5.0 %   Basophils Relative 0.8 0.0 - 3.0 %   Neutro Abs 2.7 1.4 - 7.7 K/uL   Lymphs Abs 2.8 0.7 - 4.0 K/uL   Monocytes Absolute 0.4 0.1 - 1.0 K/uL   Eosinophils Absolute 0.3 0.0 - 0.7 K/uL   Basophils Absolute 0.1 0.0 - 0.1 K/uL  Comprehensive metabolic panel  Result Value Ref Range   Sodium 138 135 - 145 mEq/L   Potassium 4.2 3.5 - 5.1 mEq/L   Chloride 102 96 - 112 mEq/L   CO2 30 19 - 32 mEq/L   Glucose, Bld 96 70 - 99 mg/dL   BUN 16 6 - 23 mg/dL   Creatinine, Ser 0.85 0.40 - 1.20 mg/dL   Total Bilirubin 0.5 0.2 - 1.2 mg/dL   Alkaline Phosphatase 74 39 - 117 U/L   AST 19 0 - 37 U/L   ALT 19 0 - 35 U/L   Total Protein 7.0 6.0 - 8.3 g/dL   Albumin 4.3 3.5 - 5.2 g/dL   GFR 69.51 >60.00 mL/min   Calcium 9.2 8.4 - 10.5 mg/dL    This visit occurred during the SARS-CoV-2 public health emergency.  Safety protocols were in place, including screening questions prior to the visit, additional usage of staff PPE, and extensive cleaning of exam room while observing appropriate contact time as indicated for disinfecting solutions.   COVID 19 screen:  No recent travel or known exposure to COVID19 The patient denies respiratory symptoms of COVID 19 at this time. The importance of social distancing was discussed today.   Assessment and Plan Problem List Items Addressed This Visit    Breast mass, left - Primary    Likely cyst, but  Given skin redness will send in antibiotics to cover for cellulitis if redness spreading or not able to have eval sooner. Refer for ASAP [place] and diagnostic mammogram.      Relevant Orders   US BREAST LTD UNI LEFT INC AXILLA   MM DIAG BREAST TOMO BILATERAL   US BREAST LTD UNI RIGHT INC AXILLA      Meds ordered this encounter  Medications  . cephALEXin (KEFLEX) 500 MG capsule     Sig: Take 1 capsule (500 mg total) by mouth 3 (three) times daily.    Dispense:  21 capsule    Refill:  0       Eliezer Lofts, MD

## 2021-01-30 NOTE — Patient Instructions (Signed)
We will get you set up with a diagnostic mammogram and [place] as soon as we are able. If redness spreading or if cannot have mammogram for a while... start keflex three times daily to cover for breast infection. Can apply warm compress to area as well.

## 2021-01-30 NOTE — Assessment & Plan Note (Signed)
Likely cyst, but  Given skin redness will send in antibiotics to cover for cellulitis if redness spreading or not able to have eval sooner. Refer for ASAP [place] and diagnostic mammogram.

## 2021-02-02 ENCOUNTER — Ambulatory Visit: Payer: BC Managed Care – PPO | Admitting: Family Medicine

## 2021-02-10 ENCOUNTER — Encounter: Payer: Self-pay | Admitting: Family Medicine

## 2021-02-10 DIAGNOSIS — N6082 Other benign mammary dysplasias of left breast: Secondary | ICD-10-CM | POA: Diagnosis not present

## 2021-02-17 DIAGNOSIS — E039 Hypothyroidism, unspecified: Secondary | ICD-10-CM | POA: Diagnosis not present

## 2021-02-17 DIAGNOSIS — N958 Other specified menopausal and perimenopausal disorders: Secondary | ICD-10-CM | POA: Diagnosis not present

## 2021-03-09 ENCOUNTER — Telehealth: Payer: Self-pay

## 2021-03-09 ENCOUNTER — Ambulatory Visit
Admission: EM | Admit: 2021-03-09 | Discharge: 2021-03-09 | Disposition: A | Discharge status: Home / Self Care | Payer: BC Managed Care – PPO | Attending: Family Medicine | Admitting: Family Medicine

## 2021-03-09 ENCOUNTER — Other Ambulatory Visit: Payer: Self-pay

## 2021-03-09 ENCOUNTER — Ambulatory Visit (INDEPENDENT_AMBULATORY_CARE_PROVIDER_SITE_OTHER): Payer: BC Managed Care – PPO

## 2021-03-09 ENCOUNTER — Ambulatory Visit: Payer: Self-pay

## 2021-03-09 ENCOUNTER — Encounter: Payer: Self-pay | Admitting: Emergency Medicine

## 2021-03-09 DIAGNOSIS — R0602 Shortness of breath: Secondary | ICD-10-CM | POA: Diagnosis not present

## 2021-03-09 DIAGNOSIS — J209 Acute bronchitis, unspecified: Secondary | ICD-10-CM

## 2021-03-09 DIAGNOSIS — R059 Cough, unspecified: Secondary | ICD-10-CM | POA: Diagnosis not present

## 2021-03-09 HISTORY — DX: COVID-19: U07.1

## 2021-03-09 MED ORDER — PREDNISONE 50 MG PO TABS: ORAL_TABLET | ORAL | 0 refills | Status: DC

## 2021-03-09 MED ORDER — BENZONATATE 200 MG PO CAPS: 200.0000 mg | ORAL_CAPSULE | Freq: Three times a day (TID) | ORAL | 0 refills | Status: DC | PRN

## 2021-03-09 MED ORDER — ALBUTEROL SULFATE HFA 108 (90 BASE) MCG/ACT IN AERS: 1.0000 | INHALATION_SPRAY | Freq: Four times a day (QID) | RESPIRATORY_TRACT | 0 refills | Status: DC | PRN

## 2021-03-09 NOTE — ED Provider Notes (Signed)
[facility]    CSN: [id number] Arrival date & time: 03/09/21  1341      History   Chief Complaint Chief Complaint  Patient presents with  . Cough  . Shortness of Breath   HPI   55 year old female presents with the above complaints.  Patient reports chest tightness, cough, and shortness of breath over the past 4 weeks.  Worse at night.  She has taken some over-the-counter medication with some improvement.  She has had a home COVID test which was negative.  No fever.  No relieving factors.  Patient is concerned given her persistent symptoms especially due to the fact that she will be traveling in the near future.  No known exacerbating factors.  No other associated symptoms.  No other complaints.  Past Medical History:  Diagnosis Date  . COVID-19   . Fibroid 01/2009   small  . HSV infection    oral - treated by dentist   . Hx: UTI (urinary tract infection)   . Irregular menses   . Ovarian cyst 01/2009 and 04/2009    Patient Active Problem List   Diagnosis Date Noted  . Breast mass, left 01/30/2021  . Elevated BP without diagnosis of hypertension 05/20/2020  . Positive ANA (antinuclear antibody) 01/26/2017  . Generalized anxiety disorder 10/22/2015  . Mouth ulcer 10/18/2014  . HSV (herpes simplex virus) infection 10/18/2014  . Raynaud phenomenon 10/18/2014  . Symptoms, such as flushing, sleeplessness, headache, lack of concentration, associated with the menopause 12/25/2013  . Routine general medical examination at a health care facility 04/08/2011    Past Surgical History:  Procedure Laterality Date  . PARTIAL HYSTERECTOMY  07/2009   1 tube removed  . TUBAL LIGATION      OB History   No obstetric history on file.      Home Medications    Prior to Admission medications   Medication Sig Start Date End Date Taking? Authorizing Provider  albuterol (VENTOLIN HFA) 108 (90 Base) MCG/ACT inhaler Inhale 1-2 puffs into the lungs every 6 (six) hours as  needed for wheezing or shortness of breath. 03/09/21  Yes Hien Perreira G, DO  benzonatate (TESSALON) 200 MG capsule Take 1 capsule (200 mg total) by mouth 3 (three) times daily as needed for cough. 03/09/21  Yes Wheeler Incorvaia G, DO  buPROPion (WELLBUTRIN XL) 150 MG 24 hr tablet TAKE 1 TABLET (150 MG TOTAL) BY MOUTH DAILY. 05/20/20  Yes Tower, Wynelle Fanny, MD  omeprazole (PRILOSEC) 20 MG capsule Take 1 capsule (20 mg total) by mouth daily. 05/20/20  Yes Tower, Wynelle Fanny, MD  predniSONE (DELTASONE) 50 MG tablet 1 tablet daily x 5 days 03/09/21  Yes Zykee Avakian G, DO  Progesterone Micronized (PROGESTERONE PO) Take 50 mg by mouth at bedtime.   Yes [provider]  valACYclovir (VALTREX) 500 MG tablet Take 500 mg by mouth daily. 12/31/20  Yes [provider]    Family History Family History  Problem Relation Age of Onset  . Hypertension Mother   . Colon polyps Mother   . Breast cancer Mother   . Hypertension Brother     Social History Social History   Tobacco Use  . Smoking status: Never Smoker  . Smokeless tobacco: Never Used  Vaping Use  . Vaping Use: Never used  Substance Use Topics  . Alcohol use: Yes    Alcohol/week: 0.0 standard drinks    Comment: occassionally  . Drug use: No     Allergies  Prozac [fluoxetine hcl]   Review of Systems Review of Systems  Constitutional: Negative for fever.  Respiratory: Positive for cough, chest tightness and shortness of breath.    Physical Exam Triage Vital Signs ED Triage Vitals  Enc Vitals Group     BP 03/09/21 1435 (!) 147/100     Pulse Rate 03/09/21 1435 (!) 109     Resp 03/09/21 1435 18     Temp 03/09/21 1435 98.4 F (36.9 C)     Temp Source 03/09/21 1435 Oral     SpO2 03/09/21 1435 100 %     Weight 03/09/21 1433 165 lb (74.8 kg)     Height 03/09/21 1433 5\' 7"  (1.702 m)     Head Circumference --      Peak Flow --      Pain Score 03/09/21 1433 0     Pain Loc --      Pain Edu? --      Excl. in [place]? --    Updated  Vital Signs BP (!) 147/100 (BP Location: Right Arm)   Pulse (!) 109   Temp 98.4 F (36.9 C) (Oral)   Resp 18   Ht 5\' 7"  (1.702 m)   Wt 74.8 kg   LMP 12/20/2008   SpO2 100%   BMI 25.84 kg/m   Visual Acuity Right Eye Distance:   Left Eye Distance:   Bilateral Distance:    Right Eye Near:   Left Eye Near:    Bilateral Near:     Physical Exam Vitals and nursing note reviewed.  Constitutional:      General: She is not in acute distress.    Appearance: Normal appearance. She is not ill-appearing.  HENT:     Head: Normocephalic and atraumatic.  Eyes:     General:        Right eye: No discharge.        Left eye: No discharge.     Conjunctiva/sclera: Conjunctivae normal.  Cardiovascular:     Rate and Rhythm: Regular rhythm. Tachycardia present.  Pulmonary:     Effort: Pulmonary effort is normal.     Breath sounds: Normal breath sounds. No wheezing, rhonchi or rales.  Neurological:     Mental Status: She is alert.  Psychiatric:        Mood and Affect: Mood normal.        Behavior: Behavior normal.    UC Treatments / Results  Labs (all labs ordered are listed, but only abnormal results are displayed) Labs Reviewed - No data to display  EKG   Radiology DG Chest 2 View  Result Date: 03/09/2021 CLINICAL DATA:  Cough.  Shortness of breath. EXAM: CHEST - 2 VIEW COMPARISON:  December 27, 2019. FINDINGS: The heart size and mediastinal contours are within normal limits. Both lungs are clear. No pneumothorax or pleural effusion is noted. The visualized skeletal structures are unremarkable. IMPRESSION: No active cardiopulmonary disease. Electronically Signed   By: Marijo Conception M.D.   On: 03/09/2021 15:07    Procedures Procedures (including critical care time)  Medications Ordered in UC Medications - No data to display  Initial Impression / Assessment and Plan / UC Course  I have reviewed the triage vital signs and the nursing notes.  Pertinent labs & imaging results  that were available during my care of the patient were reviewed by me and considered in my medical decision making (see chart for details).    55 year old female presents with cough  and chest tightness.  Chest x-ray obtained and was independently reviewed by me.  Interpretation: Normal chest x-ray.  No evidence of pneumonia.  Patient has subacute bronchitis.  Treating with prednisone, albuterol, and Tessalon Perles.  Final Clinical Impressions(s) / UC Diagnoses   Final diagnoses:  Subacute bronchitis     Discharge Instructions     Medication as prescribed.  Okay to travel.  Take care  Dr. Lacinda Axon    ED Prescriptions    Medication Sig Dispense Auth. Provider   albuterol (VENTOLIN HFA) 108 (90 Base) MCG/ACT inhaler Inhale 1-2 puffs into the lungs every 6 (six) hours as needed for wheezing or shortness of breath. 18 g Makaleigh Reinard G, DO   predniSONE (DELTASONE) 50 MG tablet 1 tablet daily x 5 days 5 tablet Baine Decesare G, DO   benzonatate (TESSALON) 200 MG capsule Take 1 capsule (200 mg total) by mouth 3 (three) times daily as needed for cough. 30 capsule Coral Spikes, DO     PDMP not reviewed this encounter.   Coral Spikes, [place] 03/09/21 1701

## 2021-03-09 NOTE — Telephone Encounter (Signed)
I spoke with pt; pt has had a dry cough for 3 - 4 wks that is random and worse at night time. Pt has had some tightness in chest and SOB last 2 - 4 days. Pt will go to one of the UCs in [place] after she speaks with her husband. Pt is in no distress with breathing now but said again symptoms usually worse at night. Pt will go to UC for in person eval and possible testing.sending note to DR [employer] as Juluis Rainier.

## 2021-03-09 NOTE — Telephone Encounter (Signed)
[facility] - Client TELEPHONE ADVICE RECORD [facility] Patient Name: Abigail Kramer Gender: Female DOB: July 19, 1966 Age: 55 Y 67 M 14 D Return Phone Number: [phone] (Primary) Address: City/State/Zip: [place] [address] Client [facility] - Client Client Site [place] MD Contact Type Call Who Is Calling Patient / Member / Family / Caregiver Call Type Triage / Clinical Relationship To Patient Self Return Phone Number [phone] (Primary) Chief Complaint CHEST PAIN - pain, pressure, heaviness or tightness Reason for Call Symptomatic / Request for [place] states has continuous cough, tightness in chest. Translation No Nurse Assessment Nurse: Claiborne Billings, RN, Kim Date/Time (Eastern Time): 03/09/2021 8:33:50 AM Confirm and document reason for call. If symptomatic, describe symptoms. ---Caller states she has a continuous cough which is causing tightness in her chest. States she has had the cough for 3 to 4 weeks which has gradually worsened, tightness in chest began on 3/18. Covid test yesterday was negative. Does the patient have any new or worsening symptoms? ---Yes Will a triage be completed? ---Yes Related visit to physician within the last 2 weeks? ---No Does the PT have any chronic conditions? (i.e. diabetes, asthma, this includes High risk factors for pregnancy, etc.) ---No Is this a behavioral health or substance abuse call? ---No Guidelines Guideline Title Affirmed Question Affirmed Notes Nurse Date/Time (Eastern Time) Cough - Chronic [1] MODERATE difficulty breathing (e.g., speaks in phrases, SOB even at rest, pulse 100-120) AND [2] still present when not coughing Claiborne Billings, RN, Maudie Mercury 03/09/2021 8:37:54 AM Disp. Time Eilene Ghazi Time) Disposition Final User 03/09/2021 8:31:39 AM Send to Urgent Ezra Sites 03/09/2021 8:39:25 AM Go  to ED Now Yes Claiborne Billings, RN, Kim PLEASE NOTE: All timestamps contained within this report are represented as [place] Standard Time. CONFIDENTIALTY NOTICE: This fax transmission is intended only for the addressee. It contains information that is legally privileged, confidential or otherwise protected from use or disclosure. If you are not the intended recipient, you are strictly prohibited from reviewing, disclosing, copying using or disseminating any of this information or taking any action in reliance on or regarding this information. If you have received this fax in error, please notify us immediately by telephone so that we can arrange for its return to [place]. Phone: [phone], Toll-Free: [phone], Fax: [phone] Page: 2 of 2 Call Id: [id number] [place] Disagree/Comply Disagree Caller Understands Yes PreDisposition InappropriateToAsk Care Advice Given Per Guideline GO TO ED NOW: * You need to be seen in the Emergency Department. * Go to the ED at ___________ [place] now. Drive carefully. BRING MEDICINES: * Bring a list of your current medicines when you go to the Emergency Department (ER). ANOTHER ADULT SHOULD DRIVE: * It is better and safer if another adult drives instead of you. CARE ADVICE given per Cough - Chronic (Adult) guideline. Comments User: Suezanne Jacquet, RN Date/Time Eilene Ghazi Time): 03/09/2021 8:44:27 AM Nurse called office backline per directives d/t caller refuses ED now disposition. Spoke with Danton Clap who instructs nurse to fax copy of triage to office and office nurse will give pt a call back shortly. Caller informed. Referrals GO TO FACILITY REFUSED

## 2021-03-09 NOTE — ED Triage Notes (Signed)
Patient c/o cough that started 4 weeks ago. She reports SOB that started 1 week ago. She did an at home COVID test which was negative.

## 2021-03-09 NOTE — Telephone Encounter (Signed)
Aware, I am out of the office and will watch for correspondence

## 2021-03-09 NOTE — Discharge Instructions (Signed)
Medication as prescribed.  Okay to travel.  Take care  Dr. Lacinda Axon

## 2021-04-15 DIAGNOSIS — R232 Flushing: Secondary | ICD-10-CM | POA: Diagnosis not present

## 2021-04-15 DIAGNOSIS — N958 Other specified menopausal and perimenopausal disorders: Secondary | ICD-10-CM | POA: Diagnosis not present

## 2021-04-15 DIAGNOSIS — R5383 Other fatigue: Secondary | ICD-10-CM | POA: Diagnosis not present

## 2021-05-29 ENCOUNTER — Other Ambulatory Visit (INDEPENDENT_AMBULATORY_CARE_PROVIDER_SITE_OTHER): Payer: BC Managed Care – PPO

## 2021-05-29 ENCOUNTER — Other Ambulatory Visit: Payer: Self-pay

## 2021-05-29 ENCOUNTER — Telehealth: Payer: Self-pay | Admitting: Family Medicine

## 2021-05-29 DIAGNOSIS — Z0000 Encounter for general adult medical examination without abnormal findings: Secondary | ICD-10-CM | POA: Diagnosis not present

## 2021-05-29 LAB — COMPREHENSIVE METABOLIC PANEL
ALT: 19 U/L (ref 0–35)
AST: 18 U/L (ref 0–37)
Albumin: 4.4 g/dL (ref 3.5–5.2)
Alkaline Phosphatase: 74 U/L (ref 39–117)
BUN: 16 mg/dL (ref 6–23)
CO2: 29 mEq/L (ref 19–32)
Calcium: 9.4 mg/dL (ref 8.4–10.5)
Chloride: 103 mEq/L (ref 96–112)
Creatinine, Ser: 0.79 mg/dL (ref 0.40–1.20)
GFR: 84.01 mL/min (ref >60.00)
Glucose, Bld: 89 mg/dL (ref 70–99)
Potassium: 4.8 mEq/L (ref 3.5–5.1)
Sodium: 140 mEq/L (ref 135–145)
Total Bilirubin: 0.5 mg/dL (ref 0.2–1.2)
Total Protein: 7.1 g/dL (ref 6.0–8.3)

## 2021-05-29 LAB — CBC WITH DIFFERENTIAL/PLATELET
Basophils Absolute: 0 10*3/uL (ref 0.0–0.1)
Basophils Relative: 0.7 % (ref 0.0–3.0)
Eosinophils Absolute: 0.1 10*3/uL (ref 0.0–0.7)
Eosinophils Relative: 1.2 % (ref 0.0–5.0)
HCT: 38.6 % (ref 36.0–46.0)
Hemoglobin: 13 g/dL (ref 12.0–15.0)
Lymphocytes Relative: 40.4 % (ref 12.0–46.0)
Lymphs Abs: 2.3 10*3/uL (ref 0.7–4.0)
MCHC: 33.8 g/dL (ref 30.0–36.0)
MCV: 89.2 fl (ref 78.0–100.0)
Monocytes Absolute: 0.3 10*3/uL (ref 0.1–1.0)
Monocytes Relative: 5.6 % (ref 3.0–12.0)
Neutro Abs: 2.9 10*3/uL (ref 1.4–7.7)
Neutrophils Relative %: 52.1 % (ref 43.0–77.0)
Platelets: 295 10*3/uL (ref 150.0–400.0)
RBC: 4.33 Mil/uL (ref 3.87–5.11)
RDW: 14.2 % (ref 11.5–15.5)
WBC: 5.7 10*3/uL (ref 4.0–10.5)

## 2021-05-29 LAB — LIPID PANEL
Cholesterol: 238 mg/dL — ABNORMAL HIGH (ref 0–200)
HDL: 94.8 mg/dL (ref >39.00)
LDL Cholesterol: 123 mg/dL — ABNORMAL HIGH (ref 0–99)
NonHDL: 143.51
Total CHOL/HDL Ratio: 3
Triglycerides: 103 mg/dL (ref 0.0–149.0)
VLDL: 20.6 mg/dL (ref 0.0–40.0)

## 2021-05-29 LAB — TSH: TSH: 1.93 u[IU]/mL (ref 0.35–4.50)

## 2021-05-29 NOTE — Telephone Encounter (Signed)
-----   Message from Ellamae Sia sent at 05/29/2021  8:56 AM EDT ----- Regarding: lab orders asap Patient is scheduled for CPX labs, please order future labs, Thanks , Karna Christmas

## 2021-05-31 ENCOUNTER — Telehealth: Payer: Self-pay | Admitting: Family Medicine

## 2021-05-31 DIAGNOSIS — Z0000 Encounter for general adult medical examination without abnormal findings: Secondary | ICD-10-CM

## 2021-05-31 NOTE — Telephone Encounter (Signed)
-----   Message from Cloyd Stagers, RT sent at 05/19/2021 12:08 PM EDT ----- Regarding: Lab Orders for Monday 6.13.2022 Please place lab orders for Monday 6.13.2022, office visit for physical on Monday 6.20.2022 Thank you, Dyke Maes RT(R)

## 2021-06-01 ENCOUNTER — Other Ambulatory Visit: Payer: BC Managed Care – PPO

## 2021-06-05 ENCOUNTER — Other Ambulatory Visit: Payer: Self-pay | Admitting: Family Medicine

## 2021-06-06 ENCOUNTER — Other Ambulatory Visit: Payer: Self-pay | Admitting: Family Medicine

## 2021-06-08 ENCOUNTER — Other Ambulatory Visit: Payer: Self-pay

## 2021-06-08 ENCOUNTER — Ambulatory Visit (INDEPENDENT_AMBULATORY_CARE_PROVIDER_SITE_OTHER): Payer: BC Managed Care – PPO | Admitting: Family Medicine

## 2021-06-08 ENCOUNTER — Encounter: Payer: Self-pay | Admitting: Family Medicine

## 2021-06-08 VITALS — BP 126/74 | HR 103 | Temp 97.7°F | Ht 67.0 in | Wt 173.0 lb

## 2021-06-08 DIAGNOSIS — N951 Menopausal and female climacteric states: Secondary | ICD-10-CM

## 2021-06-08 DIAGNOSIS — Z0000 Encounter for general adult medical examination without abnormal findings: Secondary | ICD-10-CM | POA: Diagnosis not present

## 2021-06-08 DIAGNOSIS — K219 Gastro-esophageal reflux disease without esophagitis: Secondary | ICD-10-CM | POA: Diagnosis not present

## 2021-06-08 DIAGNOSIS — F411 Generalized anxiety disorder: Secondary | ICD-10-CM | POA: Diagnosis not present

## 2021-06-08 DIAGNOSIS — Z1211 Encounter for screening for malignant neoplasm of colon: Secondary | ICD-10-CM

## 2021-06-08 MED ORDER — OMEPRAZOLE 20 MG PO CPDR: 20.0000 mg | DELAYED_RELEASE_CAPSULE | Freq: Every day | ORAL | 11 refills | Status: DC

## 2021-06-08 MED ORDER — MELOXICAM 15 MG PO TABS: 15.0000 mg | ORAL_TABLET | Freq: Every day | ORAL | 5 refills | Status: DC

## 2021-06-08 NOTE — Assessment & Plan Note (Signed)
Screening colonoscopy ordered.

## 2021-06-08 NOTE — Assessment & Plan Note (Signed)
Taking progesterone from [facility]  Brain fog/hot flashes and joint pain  Some fatigue  Wants to avoid estrogen  Disc imp of exercise and self care Avoiding ssri due to past rxn Avoiding gabapentin due to brain fog

## 2021-06-08 NOTE — Progress Notes (Signed)
Subjective:    Patient ID: Abigail Kramer, female    DOB: 03/28/1966, 55 y.o.   MRN: [id number]  This visit occurred during the SARS-CoV-2 public health emergency.  Safety protocols were in place, including screening questions prior to the visit, additional usage of staff PPE, and extensive cleaning of exam room while observing appropriate contact time as indicated for disinfecting solutions.   HPI Here for health maintenance exam and to review chronic medical problems    Wt Readings from Last 3 Encounters:  06/08/21 173 lb (78.5 kg)  03/09/21 165 lb (74.8 kg)  01/30/21 174 lb 8 oz (79.2 kg)   27.10 kg/m  Struggling with menopause  Joint pain  Brain fog  Hot flashes at night  Some mood change   Takes progesterone  Helps sleep some   Would like to try meloxicam for pain    Pap 1/09-normal Then had partial hysterectomy  She goes to [facility] and they give her micronized progesterone   Mammogram 2/22 Self breast exam - no lumps  Mother had breast cancer and also colon polyps   Colonoscopy 3/11  Due for that Wants a referral    Zoster status -has not had shingrix  Covid imm with booster Flu shot 12/21 Tdap 7/13  BP Readings from Last 3 Encounters:  06/08/21 126/74  03/09/21 (!) 147/100  01/30/21 112/80   Pulse Readings from Last 3 Encounters:  06/08/21 (!) 103  03/09/21 (!) 109  01/30/21 70    GAD Takes wellbutrin xl 150 mg daily  Menopause makes this worse   Exercise -she was doing well/ used a small trampoline and got in her steps  Had some hip symptoms  Has a total gym to use  Also kayak    GERD Takes omeprazole 20 mg daily  Has heartburn if she does not take it  No hx of ulcer   Cholesterol Lab Results  Component Value Date   CHOL 238 (H) 05/29/2021   CHOL 213 (H) 05/13/2020   CHOL 238 (H) 05/09/2019   Lab Results  Component Value Date   HDL 94.80 05/29/2021   HDL 93.20 05/13/2020   HDL 102.00 05/09/2019   Lab Results   Component Value Date   LDLCALC 123 (H) 05/29/2021   LDLCALC 102 (H) 05/13/2020   LDLCALC 115 (H) 05/09/2019   Lab Results  Component Value Date   TRIG 103.0 05/29/2021   TRIG 91.0 05/13/2020   TRIG 107.0 05/09/2019   Lab Results  Component Value Date   CHOLHDL 3 05/29/2021   CHOLHDL 2 05/13/2020   CHOLHDL 2 05/09/2019   Lab Results  Component Value Date   LDLDIRECT 99.3 12/18/2013   LDLDIRECT 90.3 01/17/2009   Diet - is fairly good  Occ red meat/fried food-not often Lots of chicken and salad and vegetable  Not a lot of butter and cheese  Other labs Results for orders placed or performed in visit on 05/29/21  TSH  Result Value Ref Range   TSH 1.93 0.35 - 4.50 uIU/mL  Lipid panel  Result Value Ref Range   Cholesterol 238 (H) 0 - 200 mg/dL   Triglycerides 103.0 0.0 - 149.0 mg/dL   HDL 94.80 >39.00 mg/dL   VLDL 20.6 0.0 - 40.0 mg/dL   LDL Cholesterol 123 (H) 0 - 99 mg/dL   Total CHOL/HDL Ratio 3    NonHDL 143.51   Comprehensive metabolic panel  Result Value Ref Range   Sodium 140 135 - 145 mEq/L  Potassium 4.8 3.5 - 5.1 mEq/L   Chloride 103 96 - 112 mEq/L   CO2 29 19 - 32 mEq/L   Glucose, Bld 89 70 - 99 mg/dL   BUN 16 6 - 23 mg/dL   Creatinine, Ser 0.79 0.40 - 1.20 mg/dL   Total Bilirubin 0.5 0.2 - 1.2 mg/dL   Alkaline Phosphatase 74 39 - 117 U/L   AST 18 0 - 37 U/L   ALT 19 0 - 35 U/L   Total Protein 7.1 6.0 - 8.3 g/dL   Albumin 4.4 3.5 - 5.2 g/dL   GFR 84.01 >60.00 mL/min   Calcium 9.4 8.4 - 10.5 mg/dL  CBC with Differential/Platelet  Result Value Ref Range   WBC 5.7 4.0 - 10.5 K/uL   RBC 4.33 3.87 - 5.11 Mil/uL   Hemoglobin 13.0 12.0 - 15.0 g/dL   HCT 38.6 36.0 - 46.0 %   MCV 89.2 78.0 - 100.0 fl   MCHC 33.8 30.0 - 36.0 g/dL   RDW 14.2 11.5 - 15.5 %   Platelets 295.0 150.0 - 400.0 K/uL   Neutrophils Relative % 52.1 43.0 - 77.0 %   Lymphocytes Relative 40.4 12.0 - 46.0 %   Monocytes Relative 5.6 3.0 - 12.0 %   Eosinophils Relative 1.2 0.0 - 5.0  %   Basophils Relative 0.7 0.0 - 3.0 %   Neutro Abs 2.9 1.4 - 7.7 K/uL   Lymphs Abs 2.3 0.7 - 4.0 K/uL   Monocytes Absolute 0.3 0.1 - 1.0 K/uL   Eosinophils Absolute 0.1 0.0 - 0.7 K/uL   Basophils Absolute 0.0 0.0 - 0.1 K/uL     Patient Active Problem List   Diagnosis Date Noted   GERD (gastroesophageal reflux disease) 06/08/2021   Colon cancer screening 06/08/2021   Breast mass, left 01/30/2021   Elevated BP without diagnosis of hypertension 05/20/2020   Positive ANA (antinuclear antibody) 01/26/2017   Generalized anxiety disorder 10/22/2015   HSV (herpes simplex virus) infection 10/18/2014   Raynaud phenomenon 10/18/2014   Symptoms, such as flushing, sleeplessness, headache, lack of concentration, associated with the menopause 12/25/2013   Routine general medical examination at a health care facility 04/08/2011   Past Medical History:  Diagnosis Date   COVID-19    Fibroid 01/2009   small   HSV infection    oral - treated by dentist    Hx: UTI (urinary tract infection)    Irregular menses    Ovarian cyst 01/2009 and 04/2009   Past Surgical History:  Procedure Laterality Date   PARTIAL HYSTERECTOMY  07/2009   1 tube removed   TUBAL LIGATION     Social History   Tobacco Use   Smoking status: Never   Smokeless tobacco: Never  Vaping Use   Vaping Use: Never used  Substance Use Topics   Alcohol use: Yes    Alcohol/week: 0.0 standard drinks    Comment: occassionally   Drug use: No   Family History  Problem Relation Age of Onset   Hypertension Mother    Colon polyps Mother    Breast cancer Mother    Hypertension Brother    Allergies  Allergen Reactions   Prozac [Fluoxetine Hcl]     Sluggishness/dismotivation   Current Outpatient Medications on File Prior to Visit  Medication Sig Dispense Refill   albuterol (VENTOLIN HFA) 108 (90 Base) MCG/ACT inhaler Inhale 1-2 puffs into the lungs every 6 (six) hours as needed for wheezing or shortness of breath. 18 g 0  buPROPion (WELLBUTRIN XL) 150 MG 24 hr tablet TAKE 1 TABLET (150 MG TOTAL) BY MOUTH DAILY. 30 tablet 11   Progesterone Micronized (PROGESTERONE PO) Take 50 mg by mouth at bedtime.     valACYclovir (VALTREX) 500 MG tablet Take 500 mg by mouth daily.     No current facility-administered medications on file prior to visit.    Review of Systems  Constitutional:  Positive for fatigue. Negative for activity change, appetite change, fever and unexpected weight change.  HENT:  Negative for congestion, ear pain, rhinorrhea, sinus pressure and sore throat.   Eyes:  Negative for pain, redness and visual disturbance.  Respiratory:  Negative for cough, shortness of breath and wheezing.   Cardiovascular:  Negative for chest pain and palpitations.  Gastrointestinal:  Negative for abdominal pain, blood in stool, constipation and diarrhea.  Endocrine: Negative for polydipsia and polyuria.  Genitourinary:  Negative for dysuria, frequency and urgency.  Musculoskeletal:  Positive for arthralgias. Negative for back pain and myalgias.  Skin:  Negative for pallor and rash.  Allergic/Immunologic: Negative for environmental allergies.  Neurological:  Negative for dizziness, syncope and headaches.       Brain fog   Hematological:  Negative for adenopathy. Does not bruise/bleed easily.  Psychiatric/Behavioral:  Negative for decreased concentration and dysphoric mood. The patient is not nervous/anxious.       Objective:   Physical Exam Constitutional:      General: She is not in acute distress.    Appearance: Normal appearance. She is well-developed and normal weight. She is not ill-appearing or diaphoretic.  HENT:     Head: Normocephalic and atraumatic.     Right Ear: Tympanic membrane, ear canal and external ear normal.     Left Ear: Tympanic membrane, ear canal and external ear normal.     Nose: Nose normal. No congestion.     Mouth/Throat:     Mouth: Mucous membranes are moist.     Pharynx: Oropharynx  is clear. No posterior oropharyngeal erythema.  Eyes:     General: No scleral icterus.    Extraocular Movements: Extraocular movements intact.     Conjunctiva/sclera: Conjunctivae normal.     Pupils: Pupils are equal, round, and reactive to light.  Neck:     Thyroid: No thyromegaly.     Vascular: No carotid bruit or JVD.  Cardiovascular:     Rate and Rhythm: Normal rate and regular rhythm.     Pulses: Normal pulses.     Heart sounds: Normal heart sounds.    No gallop.  Pulmonary:     Effort: Pulmonary effort is normal. No respiratory distress.     Breath sounds: Normal breath sounds. No wheezing.     Comments: Good air exch Chest:     Chest wall: No tenderness.  Abdominal:     General: Bowel sounds are normal. There is no distension or abdominal bruit.     Palpations: Abdomen is soft. There is no mass.     Tenderness: There is no abdominal tenderness.     Hernia: No hernia is present.  Genitourinary:    Comments: Breast exam: No mass, nodules, thickening, tenderness, bulging, retraction, inflamation, nipple discharge or skin changes noted.  No axillary or clavicular LA.     Musculoskeletal:        General: No tenderness. Normal range of motion.     Cervical back: Normal range of motion and neck supple. No rigidity. No muscular tenderness.     Right lower leg: No  edema.     Left lower leg: No edema.  Lymphadenopathy:     Cervical: No cervical adenopathy.  Skin:    General: Skin is warm and dry.     Coloration: Skin is not pale.     Findings: No erythema or rash.     Comments: Solar lentigines diffusely   Neurological:     Mental Status: She is alert. Mental status is at baseline.     Cranial Nerves: No cranial nerve deficit.     Motor: No abnormal muscle tone.     Coordination: Coordination normal.     Gait: Gait normal.     Deep Tendon Reflexes: Reflexes are normal and symmetric. Reflexes normal.  Psychiatric:        Mood and Affect: Mood normal.        Cognition and  Memory: Cognition and memory normal.          Assessment & Plan:   Problem List Items Addressed This Visit       Digestive   GERD (gastroesophageal reflux disease)    Pt needs omeprazole daily  Enc to watch diet  Would like to consider weaning in the future       Relevant Medications   omeprazole (PRILOSEC) 20 MG capsule     Other   Routine general medical examination at a health care facility - Primary    Reviewed health habits including diet and exercise and skin cancer prevention Reviewed appropriate screening tests for age  Also reviewed health mt list, fam hx and immunization status , as well as social and family history   See HPI Labs reviewed  Mammogram utd Colonoscopy ref done Interested in shingrix vaccine covid vaccinated         Symptoms, such as flushing, sleeplessness, headache, lack of concentration, associated with the menopause    [facility]  Brain fog/hot flashes and joint pain  Some fatigue  Wants to avoid estrogen  Disc imp of exercise and self care Avoiding ssri due to past rxn Avoiding gabapentin due to brain fog       Generalized anxiety disorder    Continues wellbutrin xl 150 mg daily  Worse with anxiety  Gets diarrhea with prozac  Reviewed stressors/ coping techniques/symptoms/ support sources/ tx options and side effects in detail today Enc exercise and good self care Continues progesterone       Colon cancer screening    Screening colonoscopy ordered       Relevant Orders   Ambulatory referral to Gastroenterology

## 2021-06-08 NOTE — Assessment & Plan Note (Signed)
Reviewed health habits including diet and exercise and skin cancer prevention Reviewed appropriate screening tests for age  Also reviewed health mt list, fam hx and immunization status , as well as social and family history   See HPI Labs reviewed  Mammogram utd Colonoscopy ref done Interested in shingrix vaccine covid vaccinated

## 2021-06-08 NOTE — Assessment & Plan Note (Signed)
Continues wellbutrin xl 150 mg daily  Worse with anxiety  Gets diarrhea with prozac  Reviewed stressors/ coping techniques/symptoms/ support sources/ tx options and side effects in detail today Enc exercise and good self care Continues progesterone

## 2021-06-08 NOTE — Assessment & Plan Note (Signed)
Pt needs omeprazole daily  Enc to watch diet  Would like to consider weaning in the future

## 2021-06-08 NOTE — Patient Instructions (Addendum)
For pain- take tylenol first If that does not work-then meloxicam (with food)  Stay well hydrated   If you are interested in the shingles vaccine series (Shingrix), call your insurance or pharmacy to check on coverage and location it must be given.  If affordable - you can schedule it here or at your pharmacy depending on coverage   Think about a yoga or chair yoga routine   I placed a referral for colonoscopy-you will get a call   Use sun protection

## 2021-06-19 DIAGNOSIS — N958 Other specified menopausal and perimenopausal disorders: Secondary | ICD-10-CM | POA: Diagnosis not present

## 2021-06-19 DIAGNOSIS — E039 Hypothyroidism, unspecified: Secondary | ICD-10-CM | POA: Diagnosis not present

## 2021-06-24 ENCOUNTER — Encounter: Payer: Self-pay | Admitting: Gastroenterology

## 2021-06-29 DIAGNOSIS — N958 Other specified menopausal and perimenopausal disorders: Secondary | ICD-10-CM | POA: Diagnosis not present

## 2021-06-29 DIAGNOSIS — Z6826 Body mass index (BMI) 26.0-26.9, adult: Secondary | ICD-10-CM | POA: Diagnosis not present

## 2021-06-29 DIAGNOSIS — R232 Flushing: Secondary | ICD-10-CM | POA: Diagnosis not present

## 2021-06-29 DIAGNOSIS — Z72820 Sleep deprivation: Secondary | ICD-10-CM | POA: Diagnosis not present

## 2021-07-02 DIAGNOSIS — H43812 Vitreous degeneration, left eye: Secondary | ICD-10-CM | POA: Diagnosis not present

## 2021-09-02 ENCOUNTER — Other Ambulatory Visit: Payer: Self-pay

## 2021-09-02 ENCOUNTER — Ambulatory Visit (AMBULATORY_SURGERY_CENTER): Payer: BC Managed Care – PPO

## 2021-09-02 VITALS — Ht 67.0 in | Wt 170.0 lb

## 2021-09-02 DIAGNOSIS — Z1211 Encounter for screening for malignant neoplasm of colon: Secondary | ICD-10-CM

## 2021-09-02 DIAGNOSIS — Z8371 Family history of colonic polyps: Secondary | ICD-10-CM

## 2021-09-02 MED ORDER — SUTAB 1479-225-188 MG PO TABS: 1.0000 | ORAL_TABLET | ORAL | 0 refills | Status: DC

## 2021-09-02 NOTE — Progress Notes (Signed)
Pre visit completed via phone call; Patient verified name, DOB, and address;  No egg or soy allergy known to patient  No issues known to pt with past sedation with any surgeries or procedures Patient denies ever being told they had issues or difficulty with intubation  No FH of Malignant Hyperthermia Pt is not on diet pills Pt is not on  home 02  Pt is not on blood thinners  Pt denies issues with constipation  No A fib or A flutter  EMMI video via MyChart  COVID 19 guidelines implemented in [facility] today with Pt and RN  Pt is fully vaccinated for Covid x 2 + booster; Coupon given to pt in [facility] today and NO PA's for preps discussed with pt in [facility] today  Discussed with pt there will be an out-of-pocket cost for prep and that varies from $0 to 70 +  dollars  Due to the COVID-19 pandemic we are asking patients to follow certain guidelines.  Pt aware of COVID protocols and [facility] guidelines

## 2021-09-03 ENCOUNTER — Encounter: Payer: Self-pay | Admitting: Gastroenterology

## 2021-09-14 DIAGNOSIS — N958 Other specified menopausal and perimenopausal disorders: Secondary | ICD-10-CM | POA: Diagnosis not present

## 2021-09-14 DIAGNOSIS — E039 Hypothyroidism, unspecified: Secondary | ICD-10-CM | POA: Diagnosis not present

## 2021-09-16 ENCOUNTER — Encounter: Payer: Self-pay | Admitting: Gastroenterology

## 2021-09-16 ENCOUNTER — Ambulatory Visit (AMBULATORY_SURGERY_CENTER): Payer: BC Managed Care – PPO | Admitting: Gastroenterology

## 2021-09-16 ENCOUNTER — Other Ambulatory Visit: Payer: Self-pay

## 2021-09-16 VITALS — BP 121/67 | HR 58 | Temp 97.3°F | Resp 15 | Ht 67.0 in | Wt 170.0 lb

## 2021-09-16 DIAGNOSIS — Z8371 Family history of colonic polyps: Secondary | ICD-10-CM | POA: Diagnosis not present

## 2021-09-16 DIAGNOSIS — D123 Benign neoplasm of transverse colon: Secondary | ICD-10-CM | POA: Diagnosis not present

## 2021-09-16 DIAGNOSIS — Z1211 Encounter for screening for malignant neoplasm of colon: Secondary | ICD-10-CM | POA: Diagnosis not present

## 2021-09-16 MED ORDER — SODIUM CHLORIDE 0.9 % IV SOLN: 500.0000 mL | INTRAVENOUS | Status: DC

## 2021-09-16 NOTE — Patient Instructions (Signed)
  Handouts given for polyps and hemorrhoids. Await pathology results. Resume previous diet and medications.  YOU HAD AN ENDOSCOPIC PROCEDURE TODAY AT [facility]:   Refer to the procedure report that was given to you for any specific questions about what was found during the examination.  If the procedure report does not answer your questions, please call your gastroenterologist to clarify.  If you requested that your care partner not be given the details of your procedure findings, then the procedure report has been included in a sealed envelope for you to review at your convenience later.  YOU SHOULD EXPECT: Some feelings of bloating in the abdomen. Passage of more gas than usual.  Walking can help get rid of the air that was put into your GI tract during the procedure and reduce the bloating. If you had a lower endoscopy (such as a colonoscopy or flexible sigmoidoscopy) you may notice spotting of blood in your stool or on the toilet paper. If you underwent a bowel prep for your procedure, you may not have a normal bowel movement for a few days.  Please Note:  You might notice some irritation and congestion in your nose or some drainage.  This is from the oxygen used during your procedure.  There is no need for concern and it should clear up in a day or so.  SYMPTOMS TO REPORT IMMEDIATELY:  Following lower endoscopy (colonoscopy or flexible sigmoidoscopy):  Excessive amounts of blood in the stool  Significant tenderness or worsening of abdominal pains  Swelling of the abdomen that is new, acute  Fever of 100F or higher  For urgent or emergent issues, a gastroenterologist can be reached at any hour by calling [phone]. Do not use MyChart messaging for urgent concerns.    DIET:  We do recommend a small meal at first, but then you may proceed to your regular diet.  Drink plenty of fluids but you should avoid alcoholic beverages for 24 hours.  ACTIVITY:  You should plan  to take it easy for the rest of today and you should NOT DRIVE or use heavy machinery until tomorrow (because of the sedation medicines used during the test).    FOLLOW UP: Our staff will call the number listed on your records 48-72 hours following your procedure to check on you and address any questions or concerns that you may have regarding the information given to you following your procedure. If we do not reach you, we will leave a message.  We will attempt to reach you two times.  During this call, we will ask if you have developed any symptoms of COVID 19. If you develop any symptoms (ie: fever, flu-like symptoms, shortness of breath, cough etc.) before then, please call [phone].  If you test positive for Covid 19 in the 2 weeks post procedure, please call and report this information to [place].    If any biopsies were taken you will be contacted by phone or by letter within the next 1-3 weeks.  Please call us at [phone] if you have not heard about the biopsies in 3 weeks.    SIGNATURES/CONFIDENTIALITY: You and/or your care partner have signed paperwork which will be entered into your electronic medical record.  These signatures attest to the fact that that the information above on your After Visit Summary has been reviewed and is understood.  Full responsibility of the confidentiality of this discharge information lies with you and/or your care-partner.

## 2021-09-16 NOTE — Op Note (Signed)
[facility] Patient Name: Abigail Kramer Procedure Date: 09/16/2021 10:47 AM MRN: [id number] Endoscopist: Mauri Pole , MD Age: 55 Referring MD:  Date of Birth: 05/16/1966 Gender: Female Account #: [id number] Procedure:                Colonoscopy Indications:              Screening for colorectal malignant neoplasm Medicines:                Monitored Anesthesia Care Procedure:                Pre-Anesthesia Assessment:                           - Prior to the procedure, a History and Physical                            was performed, and patient medications and                            allergies were reviewed. The patient's tolerance of                            previous anesthesia was also reviewed. The risks                            and benefits of the procedure and the sedation                            options and risks were discussed with the patient.                            All questions were answered, and informed consent                            was obtained. Prior Anticoagulants: The patient has                            taken no previous anticoagulant or antiplatelet                            agents. ASA Grade Assessment: II - A patient with                            mild systemic disease. After reviewing the risks                            and benefits, the patient was deemed in                            satisfactory condition to undergo the procedure.                           After obtaining informed consent, the colonoscope  was passed under direct vision. Throughout the                            procedure, the patient's blood pressure, pulse, and                            oxygen saturations were monitored continuously. The                            Olympus PCF-H190DL [id number]) Colonoscope was                            introduced through the anus and advanced to the the                            cecum,  identified by appendiceal orifice and                            ileocecal valve. The colonoscopy was performed                            without difficulty. The patient tolerated the                            procedure well. The quality of the bowel                            preparation was good. The ileocecal valve,                            appendiceal orifice, and rectum were photographed. Scope In: 10:53:34 AM Scope Out: 11:11:24 AM Scope Withdrawal Time: 0 hours 9 minutes 31 seconds  Total Procedure Duration: 0 hours 17 minutes 50 seconds  Findings:                 The perianal and digital rectal examinations were                            normal.                           A 4 mm polyp was found in the transverse colon. The                            polyp was sessile. The polyp was removed with a                            cold snare. Resection and retrieval were complete.                           A less than 1 mm polyp was found in the transverse                            colon. The polyp was sessile. The polyp  was removed                            with a cold biopsy forceps. Resection and retrieval                            were complete.                           Non-bleeding internal hemorrhoids were found during                            retroflexion. The hemorrhoids were medium-sized. Complications:            No immediate complications. Estimated Blood Loss:     Estimated blood loss was minimal. Impression:               - One 4 mm polyp in the transverse colon, removed                            with a cold snare. Resected and retrieved.                           - One less than 1 mm polyp in the transverse colon,                            removed with a cold biopsy forceps. Resected and                            retrieved.                           - Non-bleeding internal hemorrhoids. Recommendation:           - Patient has a contact number available for                             emergencies. The signs and symptoms of potential                            delayed complications were discussed with the                            patient. Return to normal activities tomorrow.                            Written discharge instructions were provided to the                            patient.                           - Resume previous diet.                           - Continue present medications.                           -  Await pathology results.                           - Repeat colonoscopy in 5-10 years for surveillance                            based on pathology results. Mauri Pole, MD 09/16/2021 11:25:02 AM This report has been signed electronically.

## 2021-09-16 NOTE — Progress Notes (Signed)
To PACU, VSS. Report to [email or web address] 

## 2021-09-16 NOTE — Progress Notes (Signed)
Called to room to assist during endoscopic procedure.  Patient ID and intended procedure confirmed with present staff. Received instructions for my participation in the procedure from the performing physician.  

## 2021-09-16 NOTE — Progress Notes (Signed)
[facility] History and Physical   Primary Care Physician:  Tower, Wynelle Fanny, MD   Reason for Procedure:  Colorectal cancer screening  Plan:    Screening colonoscopy with possible interventions as needed     HPI: Abigail Kramer is a very pleasant 55 y.o. female here for screening colonoscopy. Denies any nausea, vomiting, abdominal pain, melena or bright red blood per rectum  The risks and benefits as well as alternatives of endoscopic procedure(s) have been discussed and reviewed. All questions answered. The patient agrees to proceed.    Past Medical History:  Diagnosis Date   Anxiety    on meds   COVID-19    Depression    on meds   Fibroid 01/2009   small   GERD (gastroesophageal reflux disease)    on meds   HSV infection    oral - treated by dentist    Hx: UTI (urinary tract infection)    Irregular menses    Ovarian cyst 01/2009 and 04/2009    Past Surgical History:  Procedure Laterality Date   COLONOSCOPY  2011   DB-F/V-movi(good)-normal   PARTIAL HYSTERECTOMY  2014   TUBAL LIGATION  1991   WISDOM TOOTH EXTRACTION      Prior to Admission medications   Medication Sig Start Date End Date Taking? Authorizing Provider  buPROPion (WELLBUTRIN XL) 150 MG 24 hr tablet TAKE 1 TABLET BY MOUTH EVERY DAY 06/09/21   Tower, Wynelle Fanny, MD  meloxicam (MOBIC) 15 MG tablet Take 1 tablet (15 mg total) by mouth daily. 06/08/21   Tower, Wynelle Fanny, MD  omeprazole (PRILOSEC) 20 MG capsule Take 1 capsule (20 mg total) by mouth daily. 06/08/21   Tower, Wynelle Fanny, MD  Progesterone Micronized (PROGESTERONE PO) Take 50 mg by mouth at bedtime.    [provider]  Sodium Sulfate-Mag Sulfate-KCl (SUTAB) [phone] MG TABS Take 1 kit by mouth as directed. 09/02/21   Mauri Pole, MD  valACYclovir (VALTREX) 500 MG tablet Take 500 mg by mouth daily. 12/31/20   [provider]    Current Outpatient Medications  Medication Sig Dispense Refill   buPROPion  (WELLBUTRIN XL) 150 MG 24 hr tablet TAKE 1 TABLET BY MOUTH EVERY DAY 30 tablet 11   meloxicam (MOBIC) 15 MG tablet Take 1 tablet (15 mg total) by mouth daily. 30 tablet 5   omeprazole (PRILOSEC) 20 MG capsule Take 1 capsule (20 mg total) by mouth daily. 30 capsule 11   Progesterone Micronized (PROGESTERONE PO) Take 50 mg by mouth at bedtime.     Sodium Sulfate-Mag Sulfate-KCl (SUTAB) [phone] MG TABS Take 1 kit by mouth as directed. 24 tablet 0   valACYclovir (VALTREX) 500 MG tablet Take 500 mg by mouth daily.     No current facility-administered medications for this visit.    Allergies as of 09/16/2021 - Review Complete 09/02/2021  Allergen Reaction Noted   Prozac [fluoxetine hcl]  04/10/2014    Family History  Problem Relation Age of Onset   Hypertension Mother    Breast cancer Mother    Colon polyps Brother 34   Hypertension Brother    Colon cancer Neg Hx    Esophageal cancer Neg Hx    Stomach cancer Neg Hx    Rectal cancer Neg Hx     Social History   Socioeconomic History   Marital status: Married    Spouse name: Not on file   Number of children: 2   Years of education: Not on file  Highest education level: Not on file  Occupational History   Occupation: Sales  Tobacco Use   Smoking status: Never   Smokeless tobacco: Never  Vaping Use   Vaping Use: Never used  Substance and Sexual Activity   Alcohol use: Yes    Alcohol/week: 4.0 standard drinks    Types: 4 Standard drinks or equivalent per week    Comment: occassionally   Drug use: No   Sexual activity: Not on file  Other Topics Concern   Not on file  Social History Narrative   Not on file   Social Determinants of Health   Financial Resource Strain: Not on file  Food Insecurity: Not on file  Transportation Needs: Not on file  Physical Activity: Not on file  Stress: Not on file  Social Connections: Not on file  Intimate Partner Violence: Not on file    Review of Systems:  All other review of  systems negative except as mentioned in the HPI.  Physical Exam: Vital signs in last 24 hours: BP 140/75   Pulse 72   Temp (!) 97.3 F (36.3 C) (Temporal)   Resp (!) 0   Ht '5\' 7"'  (1.702 m)   Wt 170 lb (77.1 kg)   LMP 12/20/2008   SpO2 100%   BMI 26.63 kg/m     General:   Alert, NAD Lungs:  Clear .   Heart:  Regular rate and rhythm Abdomen:  Soft, nontender and nondistended. Neuro/Psych:  Alert and cooperative. Normal mood and affect. A and O x 3  Reviewed labs, radiology imaging, old records and pertinent past GI work up  Patient is appropriate for planned procedure(s) and anesthesia in an ambulatory setting   K. Denzil Magnuson , MD [phone]

## 2021-09-16 NOTE — Progress Notes (Signed)
Pt's states no medical or surgical changes since previsit or office visit. 

## 2021-09-18 ENCOUNTER — Telehealth: Payer: Self-pay | Admitting: *Deleted

## 2021-09-18 NOTE — Telephone Encounter (Signed)
  Follow up Call-  Call back number 09/16/2021  Post procedure Call Back phone  # [phone]  Permission to leave phone message Yes  Some recent data might be hidden     Patient questions:  Do you have a fever, pain , or abdominal swelling? No. Pain Score  0 *  Have you tolerated food without any problems? Yes.    Have you been able to return to your normal activities? Yes.    Do you have any questions about your discharge instructions: Diet   No. Medications  No. Follow up visit  No.  Do you have questions or concerns about your Care? No.  Actions: * If pain score is 4 or above: No action needed, pain <4.  Have you developed a fever since your procedure? no  2.   Have you had an respiratory symptoms (SOB or cough) since your procedure? no  3.   Have you tested positive for COVID 19 since your procedure no  4.   Have you had any family members/close contacts diagnosed with the COVID 19 since your procedure?  no   If yes to any of these questions please route to Joylene John, RN and Joella Prince, RN

## 2021-09-21 DIAGNOSIS — R6882 Decreased libido: Secondary | ICD-10-CM | POA: Diagnosis not present

## 2021-09-21 DIAGNOSIS — Z72820 Sleep deprivation: Secondary | ICD-10-CM | POA: Diagnosis not present

## 2021-09-21 DIAGNOSIS — N958 Other specified menopausal and perimenopausal disorders: Secondary | ICD-10-CM | POA: Diagnosis not present

## 2021-09-21 DIAGNOSIS — Z6827 Body mass index (BMI) 27.0-27.9, adult: Secondary | ICD-10-CM | POA: Diagnosis not present

## 2021-09-22 ENCOUNTER — Encounter: Payer: Self-pay | Admitting: Gastroenterology

## 2021-11-30 ENCOUNTER — Telehealth: Payer: BC Managed Care – PPO | Admitting: Nurse Practitioner

## 2021-11-30 DIAGNOSIS — U071 COVID-19: Secondary | ICD-10-CM

## 2021-11-30 MED ORDER — NIRMATRELVIR/RITONAVIR (PAXLOVID)TABLET: 3.0000 | ORAL_TABLET | Freq: Two times a day (BID) | ORAL | 0 refills | Status: AC

## 2021-11-30 NOTE — Progress Notes (Signed)
Virtual Visit Consent   Abigail Kramer, you are scheduled for a virtual visit with a [facility] provider today.     Just as with appointments in the office, your consent must be obtained to participate.  Your consent will be active for this visit and any virtual visit you may have with one of our providers in the next 365 days.     If you have a MyChart account, a copy of this consent can be sent to you electronically.  All virtual visits are billed to your insurance company just like a traditional visit in the office.    As this is a virtual visit, video technology does not allow for your provider to perform a traditional examination.  This may limit your provider's ability to fully assess your condition.  If your provider identifies any concerns that need to be evaluated in person or the need to arrange testing (such as labs, EKG, etc.), we will make arrangements to do so.     Although advances in technology are sophisticated, we cannot ensure that it will always work on either your end or our end.  If the connection with a video visit is poor, the visit may have to be switched to a telephone visit.  With either a video or telephone visit, we are not always able to ensure that we have a secure connection.     I need to obtain your verbal consent now.   Are you willing to proceed with your visit today?    Abigail Kramer has provided verbal consent on 11/30/2021 for a virtual visit (video or telephone).   Apolonio Schneiders, FNP   Date: 11/30/2021 10:14 AM   Virtual Visit via Video Note   I, Apolonio Schneiders, connected with  Abigail Kramer  ([id number], [id number]) on 11/30/21 at 10:15 AM EST by a video-enabled telemedicine application and verified that I am speaking with the correct person using two identifiers.  Location: Patient: Virtual Visit Location Patient: Home Provider: Virtual Visit Location Provider: Home Office   I discussed the limitations of evaluation and management by  telemedicine and the availability of in person appointments. The patient expressed understanding and agreed to proceed.    History of Present Illness: Abigail Kramer is a 55 y.o. who identifies as a female who was assigned female at birth, and is being seen today after testing positive for COVID-19 at home.   She started to have symptoms yesterday with headache, body aches, congested, fever, some pain with deep breaths.   She has had COVID twice in the past.  She has had the COVID vaccines and booster, most recent booster was in January 2021.   She has also had her flu vaccine.   She has been treated with steroids and an inhalers in the past for COVID infections.   Problems:  Patient Active Problem List   Diagnosis Date Noted   GERD (gastroesophageal reflux disease) 06/08/2021   Colon cancer screening 06/08/2021   Breast mass, left 01/30/2021   Elevated BP without diagnosis of hypertension 05/20/2020   Positive ANA (antinuclear antibody) 01/26/2017   Generalized anxiety disorder 10/22/2015   HSV (herpes simplex virus) infection 10/18/2014   Raynaud phenomenon 10/18/2014   Symptoms, such as flushing, sleeplessness, headache, lack of concentration, associated with the menopause 12/25/2013   Routine general medical examination at a health care facility 04/08/2011    Allergies:  Allergies  Allergen Reactions   Prozac [Fluoxetine Hcl]     Sluggishness/dismotivation  Medications:  Current Outpatient Medications  Medication Instructions   meloxicam (MOBIC) 15 mg, Oral, Daily   omeprazole (PRILOSEC) 20 mg, Oral, Daily   Progesterone Micronized (PROGESTERONE PO) 50 mg, Oral, Daily at bedtime   valACYclovir (VALTREX) 500 mg, Oral, Daily     Observations/Objective: Patient is well-developed, well-nourished in no acute distress.  Resting comfortably at home.  Head is normocephalic, atraumatic.  No labored breathing.  Speech is clear and coherent with logical content.  Patient  is alert and oriented at baseline.    Assessment and Plan: 1. COVID-19 Take anti-viral with food.   - nirmatrelvir/ritonavir EUA (PAXLOVID) 20 x 150 MG & 10 x 100MG  TABS; Take 3 tablets by mouth 2 (two) times daily for 5 days. (Take nirmatrelvir 150 mg two tablets twice daily for 5 days and ritonavir 100 mg one tablet twice daily for 5 days) Patient GFR is 84  Dispense: 30 tablet; Refill: 0    Continue to manage symptoms with over the counter medications as discussed.   Assure hydration and adequate caloric intake  Immune support with Vitamin C  Follow Up Instructions: I discussed the assessment and treatment plan with the patient. The patient was provided an opportunity to ask questions and all were answered. The patient agreed with the plan and demonstrated an understanding of the instructions.  A copy of instructions were sent to the patient via MyChart unless otherwise noted below.     The patient was advised to call back or seek an in-person evaluation if the symptoms worsen or if the condition fails to improve as anticipated.  Time:  I spent 15 minutes with the patient via telehealth technology discussing the above problems/concerns.    Apolonio Schneiders, FNP

## 2021-12-24 ENCOUNTER — Telehealth: Payer: Managed Care, Other (non HMO) | Admitting: Family Medicine

## 2021-12-24 DIAGNOSIS — R053 Chronic cough: Secondary | ICD-10-CM

## 2021-12-24 MED ORDER — BENZONATATE 100 MG PO CAPS: 100.0000 mg | ORAL_CAPSULE | Freq: Two times a day (BID) | ORAL | 0 refills | Status: DC | PRN

## 2021-12-24 MED ORDER — PREDNISONE 10 MG (21) PO TBPK
ORAL_TABLET | ORAL | 0 refills | Status: DC
Start: 2021-12-24 — End: 2022-02-12

## 2021-12-24 MED ORDER — ALBUTEROL SULFATE HFA 108 (90 BASE) MCG/ACT IN AERS
1.0000 | INHALATION_SPRAY | Freq: Four times a day (QID) | RESPIRATORY_TRACT | 0 refills | Status: AC | PRN
Start: 2021-12-24 — End: ?

## 2021-12-24 NOTE — Progress Notes (Signed)
We are sorry that you are not feeling well.  Here is how we plan to help!  Based on your presentation I believe you most likely have A cough due to a virus.  This is called viral bronchitis and is best treated by rest, plenty of fluids and control of the cough.  You may use Ibuprofen or Tylenol as directed to help your symptoms.     In addition you may use A prescription cough medication called Tessalon Perles 100mg . You may take 1-2 capsules every 8 hours as needed for your cough. And inhaler.  Prednisone 10 mg daily for 6 days (see taper instructions below)  From your responses in the eVisit questionnaire you describe inflammation in the upper respiratory tract which is causing a significant cough.  This is commonly called Bronchitis and has four common causes:   Allergies Viral Infections Acid Reflux Bacterial Infection Allergies, viruses and acid reflux are treated by controlling symptoms or eliminating the cause. An example might be a cough caused by taking certain blood pressure medications. You stop the cough by changing the medication. Another example might be a cough caused by acid reflux. Controlling the reflux helps control the cough.  USE OF BRONCHODILATOR ("RESCUE") INHALERS: There is a risk from using your bronchodilator too frequently.  The risk is that over-reliance on a medication which only relaxes the muscles surrounding the breathing tubes can reduce the effectiveness of medications prescribed to reduce swelling and congestion of the tubes themselves.  Although you feel brief relief from the bronchodilator inhaler, your asthma may actually be worsening with the tubes becoming more swollen and filled with mucus.  This can delay other crucial treatments, such as oral steroid medications. If you need to use a bronchodilator inhaler daily, several times per day, you should discuss this with your provider.  There are probably better treatments that could be used to keep your asthma  under control.     HOME CARE Only take medications as instructed by your medical team. Complete the entire course of an antibiotic. Drink plenty of fluids and get plenty of rest. Avoid close contacts especially the very young and the elderly Cover your mouth if you cough or cough into your sleeve. Always remember to wash your hands A steam or ultrasonic humidifier can help congestion.   GET HELP RIGHT AWAY IF: You develop worsening fever. You become short of breath You cough up blood. Your symptoms persist after you have completed your treatment plan MAKE SURE YOU  Understand these instructions. Will watch your condition. Will get help right away if you are not doing well or get worse.    Thank you for choosing an e-visit.  Your e-visit answers were reviewed by a board certified advanced clinical practitioner to complete your personal care plan. Depending upon the condition, your plan could have included both over the counter or prescription medications.  Please review your pharmacy choice. Make sure the pharmacy is open so you can pick up prescription now. If there is a problem, you may contact your provider through [employer] and have the prescription routed to another pharmacy.  Your safety is important to [place]. If you have drug allergies check your prescription carefully.   For the next 24 hours you can use MyChart to ask questions about today's visit, request a non-urgent call back, or ask for a work or school excuse. You will get an email in the next two days asking about your experience. I hope that your e-visit  has been valuable and will speed your recovery.  I provided 5 minutes of non face-to-face time during this encounter for chart review, medication and order placement, as well as and documentation.

## 2022-02-12 ENCOUNTER — Other Ambulatory Visit: Payer: Self-pay | Admitting: Family

## 2022-02-12 ENCOUNTER — Other Ambulatory Visit: Payer: Self-pay

## 2022-02-12 ENCOUNTER — Encounter: Payer: Self-pay | Admitting: Family

## 2022-02-12 ENCOUNTER — Ambulatory Visit (INDEPENDENT_AMBULATORY_CARE_PROVIDER_SITE_OTHER): Payer: Managed Care, Other (non HMO) | Admitting: Family

## 2022-02-12 ENCOUNTER — Ambulatory Visit
Admission: RE | Admit: 2022-02-12 | Discharge: 2022-02-12 | Disposition: A | Discharge status: Home / Self Care | Payer: Managed Care, Other (non HMO) | Source: Ambulatory Visit | Attending: Family | Admitting: Family

## 2022-02-12 VITALS — BP 138/86 | HR 72 | Temp 98.5°F | Ht 67.0 in | Wt 173.0 lb

## 2022-02-12 DIAGNOSIS — R197 Diarrhea, unspecified: Secondary | ICD-10-CM

## 2022-02-12 DIAGNOSIS — R112 Nausea with vomiting, unspecified: Secondary | ICD-10-CM | POA: Diagnosis not present

## 2022-02-12 DIAGNOSIS — R1032 Left lower quadrant pain: Secondary | ICD-10-CM | POA: Diagnosis present

## 2022-02-12 DIAGNOSIS — R1013 Epigastric pain: Secondary | ICD-10-CM | POA: Insufficient documentation

## 2022-02-12 DIAGNOSIS — K219 Gastro-esophageal reflux disease without esophagitis: Secondary | ICD-10-CM

## 2022-02-12 DIAGNOSIS — R7989 Other specified abnormal findings of blood chemistry: Secondary | ICD-10-CM

## 2022-02-12 LAB — LIPASE: Lipase: 31 U/L (ref 11.0–59.0)

## 2022-02-12 LAB — COMPREHENSIVE METABOLIC PANEL
ALT: 41 U/L — ABNORMAL HIGH (ref 0–35)
AST: 48 U/L — ABNORMAL HIGH (ref 0–37)
Albumin: 4.4 g/dL (ref 3.5–5.2)
Alkaline Phosphatase: 72 U/L (ref 39–117)
BUN: 7 mg/dL (ref 6–23)
CO2: 32 mEq/L (ref 19–32)
Calcium: 8.8 mg/dL (ref 8.4–10.5)
Chloride: 103 mEq/L (ref 96–112)
Creatinine, Ser: 0.71 mg/dL (ref 0.40–1.20)
GFR: 95.02 mL/min (ref >60.00)
Glucose, Bld: 90 mg/dL (ref 70–99)
Potassium: 4.3 mEq/L (ref 3.5–5.1)
Sodium: 139 mEq/L (ref 135–145)
Total Bilirubin: 0.5 mg/dL (ref 0.2–1.2)
Total Protein: 7.2 g/dL (ref 6.0–8.3)

## 2022-02-12 LAB — CBC WITH DIFFERENTIAL/PLATELET
Basophils Absolute: 0 10*3/uL (ref 0.0–0.1)
Basophils Relative: 0.6 % (ref 0.0–3.0)
Eosinophils Absolute: 0.1 10*3/uL (ref 0.0–0.7)
Eosinophils Relative: 1.2 % (ref 0.0–5.0)
HCT: 40.4 % (ref 36.0–46.0)
Hemoglobin: 13.4 g/dL (ref 12.0–15.0)
Lymphocytes Relative: 26.8 % (ref 12.0–46.0)
Lymphs Abs: 2.1 10*3/uL (ref 0.7–4.0)
MCHC: 33.2 g/dL (ref 30.0–36.0)
MCV: 89 fl (ref 78.0–100.0)
Monocytes Absolute: 0.6 10*3/uL (ref 0.1–1.0)
Monocytes Relative: 7 % (ref 3.0–12.0)
Neutro Abs: 5.1 10*3/uL (ref 1.4–7.7)
Neutrophils Relative %: 64.4 % (ref 43.0–77.0)
Platelets: 318 10*3/uL (ref 150.0–400.0)
RBC: 4.55 Mil/uL (ref 3.87–5.11)
RDW: 14.9 % (ref 11.5–15.5)
WBC: 8 10*3/uL (ref 4.0–10.5)

## 2022-02-12 LAB — AMYLASE: Amylase: 32 U/L (ref 27–131)

## 2022-02-12 LAB — C-REACTIVE PROTEIN: CRP: <1 mg/dL (ref 0.5–20.0)

## 2022-02-12 LAB — SEDIMENTATION RATE: Sed Rate: 14 mm/hr (ref 0–30)

## 2022-02-12 MED ORDER — ONDANSETRON HCL 4 MG PO TABS: 4.0000 mg | ORAL_TABLET | Freq: Three times a day (TID) | ORAL | 0 refills | Status: DC | PRN

## 2022-02-12 MED ORDER — PANTOPRAZOLE SODIUM 40 MG PO TBEC: 40.0000 mg | DELAYED_RELEASE_TABLET | Freq: Every day | ORAL | 30 refills | Status: DC

## 2022-02-12 MED ORDER — IOHEXOL 300 MG/ML  SOLN
100.0000 mL | Freq: Once | INTRAMUSCULAR | Status: AC | PRN
  Administered 2022-02-12: 100 mL via INTRAVENOUS

## 2022-02-12 MED ORDER — MELOXICAM 15 MG PO TABS: 15.0000 mg | ORAL_TABLET | Freq: Every day | ORAL | 5 refills | Status: DC | PRN

## 2022-02-12 NOTE — Progress Notes (Signed)
Called patient and informed her that there were no acute abnormalities or concerns on her CAT scan.  I did advise her that okay to use Imodium for the loose stools as well as Zofran being sent to pharmacy for nausea continue with brat diet over the weekend as tolerated.  If this does persist continuing over the weekend and patient is getting no relief please let me know and we may have to go the GI route.  I have also sent the pantoprazole to the pharmacy for her to get started on stop the omeprazole patient informed and verbalized understanding

## 2022-02-12 NOTE — Assessment & Plan Note (Signed)
Pending CAT scan of the abdomen pelvis to rule out diverticulitis if this is negative and there are no acute findings patient can take Imodium as needed and we can also send Zofran for nausea if needed.  Pending

## 2022-02-12 NOTE — Assessment & Plan Note (Signed)
Stop omeprazole start pantoprazole 40 mg once daily and continue for the next 2 weeks. Try to decrease and or avoid spicy foods, fried fatty foods, and also caffeine and chocolate as these can increase heartburn symptoms.

## 2022-02-12 NOTE — Assessment & Plan Note (Signed)
rule out diverticulitis stat CT abdomen and pelvis ordered pending results also ordered work-up for abdominal pain to include CRP sed rate CBC CMP amylase lipase and urine.  Pending results.  If any worsening abdominal pain patient to immediately go to the emergency room.

## 2022-02-12 NOTE — Assessment & Plan Note (Signed)
Changing omeprazole to pantoprazole to see if there is any relief patient to work on decreasing spicy fried foods.

## 2022-02-12 NOTE — Progress Notes (Signed)
Established Patient Office Visit  Subjective:  Patient ID: Abigail Kramer, female    DOB: October 13, 1966  Age: 56 y.o. MRN: [id number]  CC:  Chief Complaint  Patient presents with   Abdominal Pain    Pt stated---left side abdominal dull pain radiating back, vomiting, nausea, indigestion, loose bowel movement--5 days    HPI MYAN LOCATELLI is here today with concerns.   Five days ago started with left upper abdominal pain that radiates down to the left lower abdominal quadrant, with c/o radiation of pain as well to left upper back. She did vomit two days ago but not since which did also give her temporary relief. Also with loose stools mostly in the am and then ok the rest of the day.  Able to eat but decreased appetite. Also with nausea.  Has not yet seen a rash or anything similar.  discomfort seems to worsen at night.  Taking prilosec every day and pepcid at night. A lot of heartburn.   No urinary sx.  No fever no chills  Past Medical History:  Diagnosis Date   Anxiety    on meds   COVID-19    Depression    on meds   Fibroid 01/2009   small   GERD (gastroesophageal reflux disease)    on meds   HSV infection    oral - treated by dentist    Hx: UTI (urinary tract infection)    Irregular menses    Ovarian cyst 01/2009 and 04/2009    Past Surgical History:  Procedure Laterality Date   COLONOSCOPY  2011   DB-F/V-movi(good)-normal   PARTIAL HYSTERECTOMY  2014   TUBAL LIGATION  1991   WISDOM TOOTH EXTRACTION      Family History  Problem Relation Age of Onset   Hypertension Mother    Breast cancer Mother    Colon polyps Brother 48   Hypertension Brother    Colon cancer Neg Hx    Esophageal cancer Neg Hx    Stomach cancer Neg Hx    Rectal cancer Neg Hx     Social History   Socioeconomic History   Marital status: Married    Spouse name: Not on file   Number of children: 2   Years of education: Not on file   Highest education level: Not on file  Occupational  History   Occupation: Sales  Tobacco Use   Smoking status: Never   Smokeless tobacco: Never  Vaping Use   Vaping Use: Never used  Substance and Sexual Activity   Alcohol use: Yes    Alcohol/week: 4.0 standard drinks    Types: 4 Standard drinks or equivalent per week    Comment: occassionally   Drug use: No   Sexual activity: Not on file  Other Topics Concern   Not on file  Social History Narrative   Not on file   Social Determinants of Health   Financial Resource Strain: Not on file  Food Insecurity: Not on file  Transportation Needs: Not on file  Physical Activity: Not on file  Stress: Not on file  Social Connections: Not on file  Intimate Partner Violence: Not on file    Outpatient Medications Prior to Visit  Medication Sig Dispense Refill   albuterol (VENTOLIN HFA) 108 (90 Base) MCG/ACT inhaler Inhale 1-2 puffs into the lungs every 6 (six) hours as needed for wheezing or shortness of breath. 8 g 0   famotidine (PEPCID) 10 MG tablet Take 10 mg by mouth  2 (two) times daily.     liothyronine (CYTOMEL) 5 MCG tablet 1 tablet on an empty stomach     Probiotic Product (PROBIOTIC-10 PO) Take by mouth.     Progesterone Micronized (PROGESTERONE PO) Take 50 mg by mouth at bedtime.     valACYclovir (VALTREX) 500 MG tablet Take 500 mg by mouth daily.     benzonatate (TESSALON) 100 MG capsule Take 1 capsule (100 mg total) by mouth 2 (two) times daily as needed for cough. 20 capsule 0   omeprazole (PRILOSEC) 20 MG capsule Take 1 capsule (20 mg total) by mouth daily. 30 capsule 11   predniSONE (STERAPRED UNI-PAK 21 TAB) 10 MG (21) TBPK tablet Take as directed 21 tablet 0   meloxicam (MOBIC) 15 MG tablet Take 1 tablet (15 mg total) by mouth daily. (Patient not taking: Reported on 02/12/2022) 30 tablet 5   No facility-administered medications prior to visit.    Allergies  Allergen Reactions   Prozac [Fluoxetine Hcl]     Sluggishness/dismotivation    ROS Review of Systems   Constitutional:  Negative for chills, fatigue and fever.  Gastrointestinal:  Positive for abdominal distention, abdominal pain, diarrhea, nausea and vomiting. Negative for anal bleeding, blood in stool and constipation.  Genitourinary:  Negative for difficulty urinating, dysuria, flank pain, pelvic pain, urgency, vaginal discharge and vaginal pain.     Objective:    Physical Exam Constitutional:      General: She is not in acute distress.    Appearance: She is well-developed and normal weight. She is not ill-appearing, toxic-appearing or diaphoretic.  HENT:     Head: Normocephalic.  Pulmonary:     Effort: Pulmonary effort is normal.     Breath sounds: Normal breath sounds.  Abdominal:     General: Bowel sounds are increased. There is distension. There is no abdominal bruit. There are no signs of injury.     Palpations: Abdomen is soft. There is no fluid wave.     Tenderness: There is abdominal tenderness in the epigastric area, left upper quadrant and left lower quadrant. There is guarding. There is no rebound. Negative signs include Murphy's sign and McBurney's sign.  Neurological:     Mental Status: She is alert.    BP 138/86    Pulse 72    Temp 98.5 F (36.9 C)    Ht 5\' 7"  (1.702 m)    Wt 173 lb (78.5 kg)    LMP 12/20/2008    SpO2 96%    BMI 27.10 kg/m  Wt Readings from Last 3 Encounters:  02/12/22 173 lb (78.5 kg)  09/16/21 170 lb (77.1 kg)  09/02/21 170 lb (77.1 kg)     Health Maintenance Due  Topic Date Due   HIV Screening  Never done   Hepatitis C Screening  Never done   PAP SMEAR-Modifier  01/01/2011   Zoster Vaccines- Shingrix (1 of 2) Never done   COVID-19 Vaccine (4 - Booster for Moderna series) 02/23/2021   MAMMOGRAM  02/10/2022    There are no preventive care reminders to display for this patient.  Lab Results  Component Value Date   TSH 1.93 05/29/2021   Lab Results  Component Value Date   WBC 5.7 05/29/2021   HGB 13.0 05/29/2021   HCT 38.6  05/29/2021   MCV 89.2 05/29/2021   PLT 295.0 05/29/2021   Lab Results  Component Value Date   NA 140 05/29/2021   K 4.8 05/29/2021   CO2 29 05/29/2021  GLUCOSE 89 05/29/2021   BUN 16 05/29/2021   CREATININE 0.79 05/29/2021   BILITOT 0.5 05/29/2021   ALKPHOS 74 05/29/2021   AST 18 05/29/2021   ALT 19 05/29/2021   PROT 7.1 05/29/2021   ALBUMIN 4.4 05/29/2021   CALCIUM 9.4 05/29/2021   ANIONGAP 9 03/07/2020   GFR 84.01 05/29/2021   No results found for: HGBA1C    Assessment & Plan:   Problem List Items Addressed This Visit       Digestive   Gastroesophageal reflux disease    Stop omeprazole start pantoprazole 40 mg once daily and continue for the next 2 weeks. Try to decrease and or avoid spicy foods, fried fatty foods, and also caffeine and chocolate as these can increase heartburn symptoms.        Relevant Medications   famotidine (PEPCID) 10 MG tablet   Probiotic Product (PROBIOTIC-10 PO)   pantoprazole (PROTONIX) 40 MG tablet   Nausea vomiting and diarrhea    Pending CAT scan of the abdomen pelvis to rule out diverticulitis if this is negative and there are no acute findings patient can take Imodium as needed and we can also send Zofran for nausea if needed.  Pending      Relevant Orders   Comprehensive metabolic panel     Other   Epigastric abdominal pain    Changing omeprazole to pantoprazole to see if there is any relief patient to work on decreasing spicy fried foods.      Relevant Orders   C-reactive protein   Sedimentation rate   CBC with Differential   Amylase   Lipase   Urine Culture   Comprehensive metabolic panel   Left lower quadrant abdominal pain - Primary     rule out diverticulitis stat CT abdomen and pelvis ordered pending results also ordered work-up for abdominal pain to include CRP sed rate CBC CMP amylase lipase and urine.  Pending results.  If any worsening abdominal pain patient to immediately go to the emergency room.       Relevant Orders   C-reactive protein   Sedimentation rate   CBC with Differential   Amylase   Lipase   Urine Culture   Comprehensive metabolic panel   CT Abdomen Pelvis W Contrast    Meds ordered this encounter  Medications   meloxicam (MOBIC) 15 MG tablet    Sig: Take 1 tablet (15 mg total) by mouth daily as needed for pain.    Dispense:  30 tablet    Refill:  5    Order Specific Question:   Supervising Provider    Answer:   BEDSOLE, AMY E [[id number]]   pantoprazole (PROTONIX) 40 MG tablet    Sig: Take 1 tablet (40 mg total) by mouth daily.    Dispense:  30 tablet    Refill:  30    Order Specific Question:   Supervising Provider    Answer:   Diona Browner, AMY E [[id number]]    Follow-up: No follow-ups on file.    Eugenia Pancoast, FNP

## 2022-02-13 LAB — URINE CULTURE
MICRO NUMBER:: 13054619
Result:: NO GROWTH
SPECIMEN QUALITY:: ADEQUATE

## 2022-02-15 LAB — HM MAMMOGRAPHY

## 2022-02-25 ENCOUNTER — Encounter: Payer: Self-pay | Admitting: Family Medicine

## 2022-03-18 ENCOUNTER — Encounter: Payer: Self-pay | Admitting: Family

## 2022-03-18 ENCOUNTER — Other Ambulatory Visit: Payer: Self-pay | Admitting: Family

## 2022-03-18 DIAGNOSIS — R1013 Epigastric pain: Secondary | ICD-10-CM

## 2022-03-18 DIAGNOSIS — R7989 Other specified abnormal findings of blood chemistry: Secondary | ICD-10-CM

## 2022-03-23 ENCOUNTER — Telehealth: Payer: Self-pay | Admitting: Family Medicine

## 2022-03-23 NOTE — Telephone Encounter (Signed)
I think Tabitha put those orders in? ?

## 2022-03-23 NOTE — Telephone Encounter (Signed)
-----   Message from Velna Hatchet, RT sent at 03/22/2022 10:56 AM EDT ----- ?Regarding: Lab orders needed for appt on Wednesday, 03/24/2022 ?Verifying lab orders from 05/31/2021.  Patient is coming in for additional labs that were ordered on 03/18/22 by [place]. ? ?Thanks,  ? ?Anda Kraft ? ?

## 2022-03-24 ENCOUNTER — Other Ambulatory Visit (INDEPENDENT_AMBULATORY_CARE_PROVIDER_SITE_OTHER): Payer: Managed Care, Other (non HMO)

## 2022-03-24 DIAGNOSIS — R1013 Epigastric pain: Secondary | ICD-10-CM

## 2022-03-24 DIAGNOSIS — R7989 Other specified abnormal findings of blood chemistry: Secondary | ICD-10-CM

## 2022-03-24 LAB — HEPATIC FUNCTION PANEL
ALT: 16 U/L (ref 0–35)
AST: 19 U/L (ref 0–37)
Albumin: 4.5 g/dL (ref 3.5–5.2)
Alkaline Phosphatase: 61 U/L (ref 39–117)
Bilirubin, Direct: 0.1 mg/dL (ref 0.0–0.3)
Total Bilirubin: 0.5 mg/dL (ref 0.2–1.2)
Total Protein: 7.4 g/dL (ref 6.0–8.3)

## 2022-03-25 LAB — HEPATITIS PANEL, ACUTE
Hep A IgM: NONREACTIVE
Hep B C IgM: NONREACTIVE
Hepatitis B Surface Ag: NONREACTIVE
Hepatitis C Ab: NONREACTIVE
SIGNAL TO CUT-OFF: 0.05 (ref <1.00)

## 2022-04-01 ENCOUNTER — Encounter: Payer: Self-pay | Admitting: Family Medicine

## 2022-04-01 ENCOUNTER — Ambulatory Visit (INDEPENDENT_AMBULATORY_CARE_PROVIDER_SITE_OTHER): Payer: Managed Care, Other (non HMO) | Admitting: Family Medicine

## 2022-04-01 VITALS — BP 136/72 | HR 63 | Temp 97.8°F | Ht 67.0 in | Wt 170.1 lb

## 2022-04-01 DIAGNOSIS — R112 Nausea with vomiting, unspecified: Secondary | ICD-10-CM | POA: Diagnosis not present

## 2022-04-01 DIAGNOSIS — R1013 Epigastric pain: Secondary | ICD-10-CM | POA: Diagnosis not present

## 2022-04-01 DIAGNOSIS — K219 Gastro-esophageal reflux disease without esophagitis: Secondary | ICD-10-CM

## 2022-04-01 DIAGNOSIS — R197 Diarrhea, unspecified: Secondary | ICD-10-CM

## 2022-04-01 MED ORDER — FAMOTIDINE 20 MG PO TABS: 20.0000 mg | ORAL_TABLET | Freq: Two times a day (BID) | ORAL | 3 refills | Status: DC

## 2022-04-01 NOTE — Progress Notes (Signed)
? ?Subjective:  ? ? Patient ID: Abigail Kramer, female    DOB: [birth year], 56 y.o.   MRN: [id number] ? ?HPI ?Pt presents for GERD and abdominal pain  ? ?Wt Readings from Last 3 Encounters:  ?04/01/22 170 lb 2 oz (77.2 kg)  ?02/12/22 173 lb (78.5 kg)  ?09/16/21 170 lb (77.1 kg)  ? ?26.65 kg/m? ? ?Was px protonix 40 mg daily to used daily for 2 wk in feb from NP Dugal  (change from omeprazole) ?Pepcid complete - 1-2 at night  ?Had n/v/d as well and CT of abd /pelvis were done  ? ? ?CT report ? ?CT Abdomen Pelvis W Contrast (Accession [id number]) (Order [id number]) ?Imaging ?Date: 02/12/2022 Department: [facility] CT IMAGING Released By: Verita Schneiders Authorizing: Eugenia Pancoast, Belle  ? ?Exam Status ? ?Status  ?Final [99]  ? ?PACS Intelerad Image Link ? ? Show images for CT Abdomen Pelvis W Contrast ? ?Study Result ? ?Narrative & Impression  ?CLINICAL DATA:  Left-sided abdominal pain abdominal pain wrapping ?around to lower left back, left flank pain, N/V/D, all symptoms ?x5days. ?  ?EXAM: ?CT ABDOMEN AND PELVIS WITH CONTRAST ?  ?TECHNIQUE: ?Multidetector CT imaging of the abdomen and pelvis was performed ?using the standard protocol following bolus administration of ?intravenous contrast. ?  ?RADIATION DOSE REDUCTION: This exam was performed according to the ?departmental dose-optimization program which includes automated ?exposure control, adjustment of the mA and/or kV according to ?patient size and/or use of iterative reconstruction technique. ?  ?CONTRAST:  160m OMNIPAQUE IOHEXOL 300 MG/ML  SOLN ?  ?COMPARISON:  CT abdomen pelvis 03/02/2010 ?  ?FINDINGS: ?Lower chest: No acute abnormality. ?  ?Hepatobiliary: No focal liver abnormality is seen. No gallstones, ?gallbladder wall thickening, or biliary dilatation. ?  ?Pancreas: Unremarkable. No surrounding inflammatory changes. ?  ?Spleen: Normal in size without focal abnormality. ?  ?Adrenals/Urinary Tract: Adrenal glands are unremarkable.  Kidneys are ?normal, without renal calculi, focal lesion, or hydronephrosis. ?Bladder is unremarkable. ?  ?Stomach/Bowel: Stomach is within normal limits. Appendix appears ?normal. No evidence of bowel wall thickening, distention, or ?inflammatory changes. ?  ?Vascular/Lymphatic: No significant vascular findings are present. No ?enlarged abdominal or pelvic lymph nodes. ?  ?Reproductive: Status post hysterectomy. No adnexal masses. ?  ?Other: No abdominal wall hernia or abnormality. No abdominopelvic ?ascites. ?  ?Musculoskeletal: No acute or significant osseous findings. ?  ?IMPRESSION: ?No acute intra-abdominal pathology. ?  ?  ? ?Last colonoscopy was 08/2021  ?Few polyps  ? ?Most recent labs ?Lab Results  ?Component Value Date  ? WBC 8.0 02/12/2022  ? HGB 13.4 02/12/2022  ? HCT 40.4 02/12/2022  ? MCV 89.0 02/12/2022  ? PLT 318.0 02/12/2022  ? ?Lab Results  ?Component Value Date  ? CREATININE 0.71 02/12/2022  ? BUN 7 02/12/2022  ? NA 139 02/12/2022  ? K 4.3 02/12/2022  ? CL 103 02/12/2022  ? CO2 32 02/12/2022  ? ?Lab Results  ?Component Value Date  ? ALT 16 03/24/2022  ? AST 19 03/24/2022  ? ALKPHOS 61 03/24/2022  ? BILITOT 0.5 03/24/2022  ? ?Ast/alt were up very slightly prior  ? ? ? ?At this point, some improvement  ? ?Still has indigestion a lot  (heartburn type)  ?Worried about a possible ulcer  ? ?Triggers : unsure  ?She has tried to eliminate things ?Quit eating beef    (hamburger in feb made her vomit)  ?Elim fried foods  ?Alcohol and tea  ? ?She was using  plexus probiotics - stopped it  ? ?Has meloxicam- takes 2-3 times per week for her R arm  ? ?Some facial flushing  ? ? ?Some improvement with eating  ? ? ?Some times has some RUQ abd pain and /or tightness  ?This has moved from the L to the right  ?Comes and goes  ?No diarrhea ? ?Occ bout of nausea w/o vomiting  ? ?No dark stool except with the pepcid  ? ? ?Patient Active Problem List  ? Diagnosis Date Noted  ? Nausea vomiting and diarrhea 02/12/2022  ?  Epigastric abdominal pain 02/12/2022  ? Left lower quadrant abdominal pain 02/12/2022  ? Gastroesophageal reflux disease 06/08/2021  ? Colon cancer screening 06/08/2021  ? Breast mass, left 01/30/2021  ? Elevated BP without diagnosis of hypertension 05/20/2020  ? Positive ANA (antinuclear antibody) 01/26/2017  ? Generalized anxiety disorder 10/22/2015  ? HSV (herpes simplex virus) infection 10/18/2014  ? Raynaud phenomenon 10/18/2014  ? Symptoms, such as flushing, sleeplessness, headache, lack of concentration, associated with the menopause 12/25/2013  ? Routine general medical examination at a health care facility 04/08/2011  ? ?Past Medical History:  ?Diagnosis Date  ? Anxiety   ? on meds  ? COVID-19   ? Depression   ? on meds  ? Fibroid 01/2009  ? small  ? GERD (gastroesophageal reflux disease)   ? on meds  ? HSV infection   ? oral - treated by dentist   ? Hx: UTI (urinary tract infection)   ? Irregular menses   ? Ovarian cyst 01/2009 and 04/2009  ? ?Past Surgical History:  ?Procedure Laterality Date  ? COLONOSCOPY  2011  ? DB-F/V-movi(good)-normal  ? PARTIAL HYSTERECTOMY  2014  ? TUBAL LIGATION  1991  ? WISDOM TOOTH EXTRACTION    ? ?Social History  ? ?Tobacco Use  ? Smoking status: Never  ? Smokeless tobacco: Never  ?Vaping Use  ? Vaping Use: Never used  ?Substance Use Topics  ? Alcohol use: Yes  ?  Alcohol/week: 4.0 standard drinks  ?  Types: 4 Standard drinks or equivalent per week  ?  Comment: occassionally  ? Drug use: No  ? ?Family History  ?Problem Relation Age of Onset  ? Hypertension Mother   ? Breast cancer Mother   ? Colon polyps Brother 65  ? Hypertension Brother   ? Colon cancer Neg Hx   ? Esophageal cancer Neg Hx   ? Stomach cancer Neg Hx   ? Rectal cancer Neg Hx   ? ?Allergies  ?Allergen Reactions  ? Prozac [Fluoxetine Hcl]   ?  Sluggishness/dismotivation  ? ?Current Outpatient Medications on File Prior to Visit  ?Medication Sig Dispense Refill  ? albuterol (VENTOLIN HFA) 108 (90 Base) MCG/ACT  inhaler Inhale 1-2 puffs into the lungs every 6 (six) hours as needed for wheezing or shortness of breath. 8 g 0  ? liothyronine (CYTOMEL) 5 MCG tablet 1 tablet on an empty stomach    ? ondansetron (ZOFRAN) 4 MG tablet Take 1 tablet (4 mg total) by mouth every 8 (eight) hours as needed for nausea or vomiting. 20 tablet 0  ? pantoprazole (PROTONIX) 40 MG tablet Take 1 tablet (40 mg total) by mouth daily. 30 tablet 30  ? Probiotic Product (PROBIOTIC-10 PO) Take by mouth.    ? ?No current facility-administered medications on file prior to visit.  ?  ?Review of Systems  ?Constitutional:  Negative for activity change, appetite change, fatigue, fever and unexpected weight change.  ?HENT:  Negative for congestion, ear pain, rhinorrhea, sinus pressure and sore throat.   ?Eyes:  Negative for pain, redness and visual disturbance.  ?Respiratory:  Negative for cough, shortness of breath and wheezing.   ?Cardiovascular:  Negative for chest pain and palpitations.  ?Gastrointestinal:  Positive for abdominal pain. Negative for blood in stool, constipation, diarrhea and vomiting.  ?Endocrine: Negative for polydipsia and polyuria.  ?Genitourinary:  Negative for dysuria, frequency and urgency.  ?Musculoskeletal:  Negative for arthralgias, back pain and myalgias.  ?Skin:  Negative for pallor and rash.  ?Allergic/Immunologic: Negative for environmental allergies.  ?Neurological:  Negative for dizziness, syncope and headaches.  ?Hematological:  Negative for adenopathy. Does not bruise/bleed easily.  ?Psychiatric/Behavioral:  Negative for decreased concentration and dysphoric mood. The patient is not nervous/anxious.   ? ?   ?Objective:  ? Physical Exam ?Constitutional:   ?   General: She is not in acute distress. ?   Appearance: She is well-developed and normal weight. She is not ill-appearing or diaphoretic.  ?HENT:  ?   Head: Normocephalic and atraumatic.  ?Eyes:  ?   Conjunctiva/sclera: Conjunctivae normal.  ?   Pupils: Pupils are  equal, round, and reactive to light.  ?Neck:  ?   Thyroid: No thyromegaly.  ?   Vascular: No carotid bruit or JVD.  ?Cardiovascular:  ?   Rate and Rhythm: Normal rate and regular rhythm.  ?   Heart sounds: Normal

## 2022-04-01 NOTE — Patient Instructions (Addendum)
Stop meloxicam for now  ?Use the voltaren gel on affected area up to 4 times daily  ? ? ?Hold the generic protonix for several days ?Take pepcid 20 mg twice daily instead  ? ?If improved, keep doing that  ?If not go back to the protonix  ? ? ?Avoid foods that bother you  ? ?Lab today for H pylori  ? ? ?I placed a GI referral  ?Call the GI office to make the appointment  ? ? ?[facility]  [phone] ? ? ? ? ? ? ?

## 2022-04-04 NOTE — Assessment & Plan Note (Signed)
CT abd/pelvis reviewed/unremarkable  ?No longer has n/v  ? ?

## 2022-04-04 NOTE — Assessment & Plan Note (Addendum)
In addn to GERD ?Not controlled by protinix (if this causes bloating?) ? ?Watching diet  ?Hold protonix for trial of pepcid 20 mg bid  ?CT reviewed ?Last labs reviewed  ?H pylori (serum) today  ?Ref to GI ?ER precautions discussed  ?

## 2022-04-04 NOTE — Assessment & Plan Note (Signed)
Not improved with protonix ?Also some upper abdominal pain , possibly gastritis ? ?Will hold protonix (? Causing bloating and gas) and try pepcid 20 mg bid for several days and update ?Disc watching diet  ?

## 2022-04-05 ENCOUNTER — Encounter: Payer: Self-pay | Admitting: Family Medicine

## 2022-04-05 LAB — H PYLORI, IGM, IGG, IGA AB
H pylori, IgM Abs: <9 units (ref 0.0–8.9)
H. pylori, IgA Abs: <9 units (ref 0.0–8.9)
H. pylori, IgG AbS: 0.14 Index Value (ref 0.00–0.79)

## 2022-04-19 ENCOUNTER — Encounter: Payer: Self-pay | Admitting: Physician Assistant

## 2022-04-19 ENCOUNTER — Ambulatory Visit (INDEPENDENT_AMBULATORY_CARE_PROVIDER_SITE_OTHER): Payer: Managed Care, Other (non HMO) | Admitting: Physician Assistant

## 2022-04-19 ENCOUNTER — Other Ambulatory Visit: Payer: Self-pay | Admitting: Physician Assistant

## 2022-04-19 VITALS — BP 110/64 | HR 78 | Ht 67.0 in | Wt 167.0 lb

## 2022-04-19 DIAGNOSIS — R1013 Epigastric pain: Secondary | ICD-10-CM

## 2022-04-19 DIAGNOSIS — K5900 Constipation, unspecified: Secondary | ICD-10-CM

## 2022-04-19 DIAGNOSIS — K219 Gastro-esophageal reflux disease without esophagitis: Secondary | ICD-10-CM

## 2022-04-19 MED ORDER — OMEPRAZOLE 40 MG PO CPDR: 40.0000 mg | DELAYED_RELEASE_CAPSULE | Freq: Two times a day (BID) | ORAL | 3 refills | Status: DC

## 2022-04-19 NOTE — Patient Instructions (Addendum)
You have been scheduled for an endoscopy. Please follow written instructions given to you at your visit today. ?If you use inhalers (even only as needed), please bring them with you on the day of your procedure. ? ?We have sent the following medications to your pharmacy for you to pick up at your convenience: ?Omeprazole  ? ?Increase your Omeprazole '40mg'$  -twice daily.  ? ?Start Miralax 1 capful daily.  ? ?Thank you for choosing me and [facility]. ? ?Ellouise Newer PA  ? ?

## 2022-04-19 NOTE — Progress Notes (Signed)
? ?Chief Complaint: GERD and right upper quadrant pain ? ?HPI: ?   Abigail Kramer is a 56 year old female with a past medical history as listed below including anxiety and reflux, known to Dr. Silverio Decamp, who was referred to me by Abner Greenspan, MD for a complaint of GERD and right upper quadrant pain. ?   09/16/2021 colonoscopy for screening with one 4 mm polyp in the transverse colon, one less than 1 mm polyp in the transverse colon and nonbleeding internal hemorrhoids.  Pathology showed tubular adenomas and repeat recommended in 7 years. ?   02/12/2022 CT of the abdomen pelvis with contrast showed no acute intra-abdominal pathology.  CBC normal.  CMP normal. ?   04/01/2022 office visit with PCP for GERD.  At that time discussed she was previously on Protonix 40 mg daily for 2 weeks being changed from Omeprazole.  Also using Pepcid Complete 1:59 at night.  Describes some nausea vomiting and diarrhea.  Had a CT of the abdomen pelvis.  At time of that follow-up had some improvement but still had indigestion and is worried about an ulcer.  She tried quitting beef and eliminating fried foods as well as alcohol and tea.  Does take Meloxicam 2-3 times per week for her right arm.  At that time they chose to hold Protonix as it was question of it causing bloating and gas and patient was told to use Pepcid 20 mg twice daily for several days.  H. pylori testing was ordered and negative. ?   Today, the patient tells me that the symptoms started the week after Valentine's Day and have continued since.  Describes an epigastric/right upper quadrant pain which always feels like there is something there and sometimes hurts more than others typically after eating within 30 to 40 minutes.  Does tell me that sometimes if this pain starts and she eats something it will actually get better for a while but then she seems to get hungry very soon afterwards.  Initially saw her PCPs office who changed her from Omeprazole 20 mg daily to  Pantoprazole 40 mg daily but this seemed to make her more bloated and did not work as well.  She was then tried on just Pepcid 20 mg twice a day but felt like this gave her a headache so right now over the past few weeks she has been just using Omeprazole 20 mg once daily which she was on before.  Tells me for the past 2 years though she has had reflux which is not controlled at all.  Often uses over-the-counter Pepcid Complete in the evenings to help with this.  Has also used Pepto-Bismol for breakthrough symptoms. ?   Along with the above has been experiencing constipation really feeling like she is unable to go at all without taking something.  She has tried Dulcolax chews which worked the first day but do not seem to work after that.  Trying to not take something every day but really she does not go if she does not. ?   Recently changed jobs after working in one spot for 35 years in October of last year. ?   Denies fever, chills, weight loss, blood in her stool or symptoms that awaken her from sleep. ? ?Past Medical History:  ?Diagnosis Date  ? Anxiety   ? on meds  ? COVID-19   ? Depression   ? on meds  ? Fibroid 01/2009  ? small  ? GERD (gastroesophageal reflux disease)   ?  on meds  ? HSV infection   ? oral - treated by dentist   ? Hx: UTI (urinary tract infection)   ? Irregular menses   ? Ovarian cyst 01/2009 and 04/2009  ? ? ?Past Surgical History:  ?Procedure Laterality Date  ? COLONOSCOPY  2011  ? DB-F/V-movi(good)-normal  ? PARTIAL HYSTERECTOMY  2014  ? TUBAL LIGATION  1991  ? WISDOM TOOTH EXTRACTION    ? ? ?Current Outpatient Medications  ?Medication Sig Dispense Refill  ? albuterol (VENTOLIN HFA) 108 (90 Base) MCG/ACT inhaler Inhale 1-2 puffs into the lungs every 6 (six) hours as needed for wheezing or shortness of breath. 8 g 0  ? liothyronine (CYTOMEL) 5 MCG tablet 1 tablet on an empty stomach    ? ondansetron (ZOFRAN) 4 MG tablet Take 1 tablet (4 mg total) by mouth every 8 (eight) hours as needed for  nausea or vomiting. 20 tablet 0  ? ?No current facility-administered medications for this visit.  ? ? ?Allergies as of 04/19/2022 - Review Complete 04/19/2022  ?Allergen Reaction Noted  ? Prozac [fluoxetine hcl]  04/10/2014  ? ? ?Family History  ?Problem Relation Age of Onset  ? Hypertension Mother   ? Breast cancer Mother   ? Colon polyps Brother 20  ? Hypertension Brother   ? Colon cancer Neg Hx   ? Esophageal cancer Neg Hx   ? Stomach cancer Neg Hx   ? Rectal cancer Neg Hx   ? ? ?Social History  ? ?Socioeconomic History  ? Marital status: Married  ?  Spouse name: Not on file  ? Number of children: 2  ? Years of education: Not on file  ? Highest education level: Not on file  ?Occupational History  ? Occupation: Sales  ?Tobacco Use  ? Smoking status: Never  ? Smokeless tobacco: Never  ?Vaping Use  ? Vaping Use: Never used  ?Substance and Sexual Activity  ? Alcohol use: Yes  ?  Alcohol/week: 4.0 standard drinks  ?  Types: 4 Standard drinks or equivalent per week  ?  Comment: occassionally  ? Drug use: No  ? Sexual activity: Not on file  ?Other Topics Concern  ? Not on file  ?Social History Narrative  ? Not on file  ? ?Social Determinants of Health  ? ?Financial Resource Strain: Not on file  ?Food Insecurity: Not on file  ?Transportation Needs: Not on file  ?Physical Activity: Not on file  ?Stress: Not on file  ?Social Connections: Not on file  ?Intimate Partner Violence: Not on file  ? ? ?Review of Systems:    ?Constitutional: No weight loss, fever or chills ?Cardiovascular: No chest pain ?Respiratory: No SOB ?Gastrointestinal: See HPI and otherwise negative ? ? Physical Exam:  ?Vital signs: ?BP 110/64   Pulse 78   Ht '5\' 7"'$  (1.702 m)   Wt 167 lb (75.8 kg)   LMP 12/20/2008   BMI 26.16 kg/m?   ? ?Constitutional:   Pleasant Caucasian female appears to be in NAD, Well developed, Well nourished, alert and cooperative ?Respiratory: Respirations even and unlabored. Lungs clear to auscultation bilaterally.   No  wheezes, crackles, or rhonchi.  ?Cardiovascular: Normal S1, S2. No MRG. Regular rate and rhythm. No peripheral edema, cyanosis or pallor.  ?Gastrointestinal:  Soft, nondistended, moderate epigastric/RUQ ttp, No rebound or guarding. Normal bowel sounds. No appreciable masses or hepatomegaly. ?Rectal:  Not performed.  ?Psychiatric: Oriented to person, place and time. Demonstrates good judgement and reason without abnormal affect or behaviors. ? ?  RELEVANT LABS AND IMAGING: ?CBC ?   ?Component Value Date/Time  ? WBC 8.0 02/12/2022 0903  ? RBC 4.55 02/12/2022 0903  ? HGB 13.4 02/12/2022 0903  ? HCT 40.4 02/12/2022 0903  ? PLT 318.0 02/12/2022 0903  ? MCV 89.0 02/12/2022 0903  ? MCH 30.1 01/09/2016 1621  ? MCHC 33.2 02/12/2022 0903  ? RDW 14.9 02/12/2022 0903  ? LYMPHSABS 2.1 02/12/2022 0903  ? MONOABS 0.6 02/12/2022 0903  ? EOSABS 0.1 02/12/2022 0903  ? BASOSABS 0.0 02/12/2022 3220  ? ? ?CMP  ?   ?Component Value Date/Time  ? NA 139 02/12/2022 0903  ? K 4.3 02/12/2022 0903  ? CL 103 02/12/2022 0903  ? CO2 32 02/12/2022 0903  ? GLUCOSE 90 02/12/2022 0903  ? BUN 7 02/12/2022 0903  ? CREATININE 0.71 02/12/2022 0903  ? CREATININE 0.73 01/09/2016 1621  ? CALCIUM 8.8 02/12/2022 0903  ? PROT 7.4 03/24/2022 0739  ? ALBUMIN 4.5 03/24/2022 0739  ? AST 19 03/24/2022 0739  ? ALT 16 03/24/2022 0739  ? ALKPHOS 61 03/24/2022 0739  ? BILITOT 0.5 03/24/2022 0739  ? GFRNONAA >60 03/07/2020 1042  ? GFRAA >60 03/07/2020 1042  ? ? ?Assessment: ?1.  Right upper quadrant/epigastric pain: With below, worse after eating or on a completely empty stomach, minimal relief with over-the-counter acids including Pepcid Complete and Pepto-Bismol and Omeprazole 20 mg daily, uncontrolled for 2 years and no prior EGD ?2.  GERD: Uncontrolled over the past 2 years on Omeprazole 20 mg daily, now with pain as above ?3.  Constipation: With above, consider relation to changes in antisecretory's recently, recent colonoscopy in September of last year with a few  polyps and otherwise normal, could also be related to IBS given new job ? ?Plan: ?1.  Scheduled patient for diagnostic EGD in the [place] with Dr. Silverio Decamp.  Did provide the patient a detailed list of risks for the procedure and

## 2022-04-23 ENCOUNTER — Encounter: Payer: Self-pay | Admitting: Gastroenterology

## 2022-04-23 ENCOUNTER — Ambulatory Visit (AMBULATORY_SURGERY_CENTER): Payer: Managed Care, Other (non HMO) | Admitting: Gastroenterology

## 2022-04-23 VITALS — BP 118/64 | HR 59 | Temp 98.2°F | Resp 13 | Ht 67.0 in | Wt 167.0 lb

## 2022-04-23 DIAGNOSIS — R1013 Epigastric pain: Secondary | ICD-10-CM | POA: Diagnosis not present

## 2022-04-23 DIAGNOSIS — K219 Gastro-esophageal reflux disease without esophagitis: Secondary | ICD-10-CM | POA: Diagnosis not present

## 2022-04-23 DIAGNOSIS — K2289 Other specified disease of esophagus: Secondary | ICD-10-CM

## 2022-04-23 DIAGNOSIS — K225 Diverticulum of esophagus, acquired: Secondary | ICD-10-CM | POA: Diagnosis not present

## 2022-04-23 DIAGNOSIS — K2100 Gastro-esophageal reflux disease with esophagitis, without bleeding: Secondary | ICD-10-CM

## 2022-04-23 DIAGNOSIS — K449 Diaphragmatic hernia without obstruction or gangrene: Secondary | ICD-10-CM

## 2022-04-23 DIAGNOSIS — K2970 Gastritis, unspecified, without bleeding: Secondary | ICD-10-CM

## 2022-04-23 MED ORDER — SODIUM CHLORIDE 0.9 % IV SOLN: 500.0000 mL | Freq: Once | INTRAVENOUS | Status: DC

## 2022-04-23 NOTE — Patient Instructions (Signed)
Information on hiatal hernias given to you today. ? ?Await pathology results from the biopsies taken today. ? ?Resume previous diet and medications. ? ?Return to GI clinic in 2 months. ? ? ?YOU HAD AN ENDOSCOPIC PROCEDURE TODAY AT [facility]:   Refer to the procedure report that was given to you for any specific questions about what was found during the examination.  If the procedure report does not answer your questions, please call your gastroenterologist to clarify.  If you requested that your care partner not be given the details of your procedure findings, then the procedure report has been included in a sealed envelope for you to review at your convenience later. ? ?YOU SHOULD EXPECT: Some feelings of bloating in the abdomen. Passage of more gas than usual.  Walking can help get rid of the air that was put into your GI tract during the procedure and reduce the bloating. If you had a lower endoscopy (such as a colonoscopy or flexible sigmoidoscopy) you may notice spotting of blood in your stool or on the toilet paper. If you underwent a bowel prep for your procedure, you may not have a normal bowel movement for a few days. ? ?Please Note:  You might notice some irritation and congestion in your nose or some drainage.  This is from the oxygen used during your procedure.  There is no need for concern and it should clear up in a day or so. ? ?SYMPTOMS TO REPORT IMMEDIATELY: ? ? ?Following upper endoscopy (EGD) ? Vomiting of blood or coffee ground material ? New chest pain or pain under the shoulder blades ? Painful or persistently difficult swallowing ? New shortness of breath ? Fever of 100?F or higher ? Black, tarry-looking stools ? ?For urgent or emergent issues, a gastroenterologist can be reached at any hour by calling [phone]. ?Do not use MyChart messaging for urgent concerns.  ? ? ?DIET:  We do recommend a small meal at first, but then you may proceed to your regular diet.  Drink  plenty of fluids but you should avoid alcoholic beverages for 24 hours. ? ?ACTIVITY:  You should plan to take it easy for the rest of today and you should NOT DRIVE or use heavy machinery until tomorrow (because of the sedation medicines used during the test).   ? ?FOLLOW UP: ?Our staff will call the number listed on your records 48-72 hours following your procedure to check on you and address any questions or concerns that you may have regarding the information given to you following your procedure. If we do not reach you, we will leave a message.  We will attempt to reach you two times.  During this call, we will ask if you have developed any symptoms of COVID 19. If you develop any symptoms (ie: fever, flu-like symptoms, shortness of breath, cough etc.) before then, please call [phone].  If you test positive for Covid 19 in the 2 weeks post procedure, please call and report this information to [place].   ? ?If any biopsies were taken you will be contacted by phone or by letter within the next 1-3 weeks.  Please call us at [phone] if you have not heard about the biopsies in 3 weeks.  ? ? ?SIGNATURES/CONFIDENTIALITY: ?You and/or your care partner have signed paperwork which will be entered into your electronic medical record.  These signatures attest to the fact that that the information above on your After Visit Summary has been reviewed  and is understood.  Full responsibility of the confidentiality of this discharge information lies with you and/or your care-partner.  ?

## 2022-04-23 NOTE — Progress Notes (Signed)
To Pacu, VSS. Report to [email or web address] 

## 2022-04-23 NOTE — Progress Notes (Signed)
Called to room to assist during endoscopic procedure.  Patient ID and intended procedure confirmed with present staff. Received instructions for my participation in the procedure from the performing physician.  

## 2022-04-23 NOTE — Op Note (Signed)
[facility] ?Patient Name: Abigail Kramer ?Procedure Date: 04/23/2022 1:57 PM ?MRN: [id number] ?Endoscopist: Mauri Pole , MD ?Age: 56 ?Referring MD:  ?Date of Birth: 01/28/1966 ?Gender: Female ?Account #: [id number] ?Procedure:                Upper GI endoscopy ?Indications:              Epigastric abdominal pain ?Medicines:                Monitored Anesthesia Care ?Procedure:                Pre-Anesthesia Assessment: ?                          - Prior to the procedure, a History and Physical  ?                          was performed, and patient medications and  ?                          allergies were reviewed. The patient's tolerance of  ?                          previous anesthesia was also reviewed. The risks  ?                          and benefits of the procedure and the sedation  ?                          options and risks were discussed with the patient.  ?                          All questions were answered, and informed consent  ?                          was obtained. Prior Anticoagulants: The patient has  ?                          taken no previous anticoagulant or antiplatelet  ?                          agents. ASA Grade Assessment: II - A patient with  ?                          mild systemic disease. After reviewing the risks  ?                          and benefits, the patient was deemed in  ?                          satisfactory condition to undergo the procedure. ?                          After obtaining informed consent, the endoscope was  ?  passed under direct vision. Throughout the  ?                          procedure, the patient's blood pressure, pulse, and  ?                          oxygen saturations were monitored continuously. The  ?                          [place] [id number] was introduced through  ?                          the mouth, and advanced to the second part of  ?                          duodenum. The upper GI  endoscopy was accomplished  ?                          without difficulty. The patient tolerated the  ?                          procedure well. ?Scope In: ?Scope Out: ?Findings:                 LA Grade C (one or more mucosal breaks continuous  ?                          between tops of 2 or more mucosal folds, less than  ?                          75% circumference) esophagitis with nodularity and  ?                          no bleeding was found 32 to 34 cm from the  ?                          incisors. Biopsies were taken with a cold forceps  ?                          for histology. ?                          A diverticulum with a small opening was found at  ?                          the gastroesophageal junction. ?                          A 5 cm hiatal hernia was present. ?                          The stomach was normal. ?                          The cardia and gastric fundus were  normal on  ?                          retroflexion. ?                          The examined duodenum was normal. ?Complications:            No immediate complications. ?Estimated Blood Loss:     Estimated blood loss was minimal. ?Impression:               - LA Grade C reflux esophagitis with nodularity and  ?                          no bleeding. Biopsied. ?                          - Diverticulum at the gastroesophageal junction. ?                          - 5 cm hiatal hernia. ?                          - Normal stomach. ?                          - Normal examined duodenum. ?Recommendation:           - Resume previous diet. ?                          - Continue present medications. ?                          - Await pathology results. ?                          - Return to GI office in 2 months. ?Mauri Pole, MD ?04/23/2022 2:22:38 PM ?This report has been signed electronically. ?

## 2022-04-23 NOTE — Progress Notes (Signed)
[facility] History and Physical ? ? ?Primary Care Physician:  Tower, Wynelle Fanny, MD ? ? ?Reason for Procedure:  Epigastric abd pain ? ?Plan:    EGD with possible interventions as needed ? ? ? ? ?HPI: Abigail Kramer is a very pleasant 56 y.o. female here for EGD for evaluation of epigastric abdominal pain. ?Please refer to office visit note 04/19/22 for details ? ?The risks and benefits as well as alternatives of endoscopic procedure(s) have been discussed and reviewed. All questions answered. The patient agrees to proceed. ? ? ? ?Past Medical History:  ?Diagnosis Date  ? Anxiety   ? on meds  ? COVID-19   ? Depression   ? on meds  ? Fibroid 01/2009  ? small  ? GERD (gastroesophageal reflux disease)   ? on meds  ? HSV infection   ? oral - treated by dentist   ? Hx: UTI (urinary tract infection)   ? Irregular menses   ? Ovarian cyst 01/2009 and 04/2009  ? ? ?Past Surgical History:  ?Procedure Laterality Date  ? COLONOSCOPY  2011  ? DB-F/V-movi(good)-normal  ? PARTIAL HYSTERECTOMY  2014  ? TUBAL LIGATION  1991  ? WISDOM TOOTH EXTRACTION    ? ? ?Prior to Admission medications   ?Medication Sig Start Date End Date Taking? Authorizing Provider  ?liothyronine (CYTOMEL) 5 MCG tablet 1 tablet on an empty stomach 12/29/21  Yes [provider]  ?omeprazole (PRILOSEC) 40 MG capsule TAKE 1 CAPSULE BY MOUTH TWICE A DAY 04/20/22  Yes Levin Erp, PA  ?Probiotic Product (ALIGN) 4 MG CAPS Take 1 capsule by mouth daily.   Yes [provider]  ?albuterol (VENTOLIN HFA) 108 (90 Base) MCG/ACT inhaler Inhale 1-2 puffs into the lungs every 6 (six) hours as needed for wheezing or shortness of breath. 12/24/21   Perlie Mayo, NP  ?ondansetron (ZOFRAN) 4 MG tablet Take 1 tablet (4 mg total) by mouth every 8 (eight) hours as needed for nausea or vomiting. 02/12/22   Eugenia Pancoast, FNP  ? ? ?Current Outpatient Medications  ?Medication Sig Dispense Refill  ? liothyronine (CYTOMEL) 5 MCG tablet 1 tablet on an  empty stomach    ? omeprazole (PRILOSEC) 40 MG capsule TAKE 1 CAPSULE BY MOUTH TWICE A DAY 60 capsule 3  ? Probiotic Product (ALIGN) 4 MG CAPS Take 1 capsule by mouth daily.    ? albuterol (VENTOLIN HFA) 108 (90 Base) MCG/ACT inhaler Inhale 1-2 puffs into the lungs every 6 (six) hours as needed for wheezing or shortness of breath. 8 g 0  ? ondansetron (ZOFRAN) 4 MG tablet Take 1 tablet (4 mg total) by mouth every 8 (eight) hours as needed for nausea or vomiting. 20 tablet 0  ? ?Current Facility-Administered Medications  ?Medication Dose Route Frequency Provider Last Rate Last Admin  ? 0.9 %  sodium chloride infusion  500 mL Intravenous Once Saidi Santacroce, Venia Minks, MD      ? ? ?Allergies as of 04/23/2022 - Review Complete 04/19/2022  ?Allergen Reaction Noted  ? Prozac [fluoxetine hcl]  04/10/2014  ? ? ?Family History  ?Problem Relation Age of Onset  ? Hypertension Mother   ? Breast cancer Mother   ? Lymphoma Mother   ? Myasthenia gravis Father   ? Colon polyps Brother 69  ? Hypertension Brother   ? Colon cancer Neg Hx   ? Esophageal cancer Neg Hx   ? Stomach cancer Neg Hx   ? Rectal cancer Neg Hx   ? ? ?  Social History  ? ?Socioeconomic History  ? Marital status: Married  ?  Spouse name: Not on file  ? Number of children: 2  ? Years of education: Not on file  ? Highest education level: Not on file  ?Occupational History  ? Occupation: Sales  ?Tobacco Use  ? Smoking status: Never  ? Smokeless tobacco: Never  ?Vaping Use  ? Vaping Use: Never used  ?Substance and Sexual Activity  ? Alcohol use: Yes  ?  Alcohol/week: 4.0 standard drinks  ?  Types: 4 Standard drinks or equivalent per week  ?  Comment: occassionally  ? Drug use: No  ? Sexual activity: Not on file  ?Other Topics Concern  ? Not on file  ?Social History Narrative  ? Not on file  ? ?Social Determinants of Health  ? ?Financial Resource Strain: Not on file  ?Food Insecurity: Not on file  ?Transportation Needs: Not on file  ?Physical Activity: Not on file  ?Stress:  Not on file  ?Social Connections: Not on file  ?Intimate Partner Violence: Not on file  ? ? ?Review of Systems: ? ?All other review of systems negative except as mentioned in the HPI. ? ?Physical Exam: ?Vital signs in last 24 hours: ?BP 140/66   Pulse 68   Temp 98.2 ?F (36.8 ?C)   Ht '5\' 7"'$  (1.702 m)   Wt 167 lb (75.8 kg)   LMP 12/20/2008   SpO2 100%   BMI 26.16 kg/m?  ?General:   Alert, NAD ?Lungs:  Clear .   ?Heart:  Regular rate and rhythm ?Abdomen:  Soft, nontender and nondistended. ?Neuro/Psych:  Alert and cooperative. Normal mood and affect. A and O x 3 ? ?Reviewed labs, radiology imaging, old records and pertinent past GI work up ? ?Patient is appropriate for planned procedure(s) and anesthesia in an ambulatory setting ? ? ?K. Denzil Magnuson , MD ?[phone]  ? ? ?  ?

## 2022-04-23 NOTE — Progress Notes (Signed)
Pt's states no medical or surgical changes since previsit or office visit. 

## 2022-04-26 ENCOUNTER — Telehealth: Payer: Self-pay

## 2022-04-26 ENCOUNTER — Other Ambulatory Visit: Payer: Self-pay

## 2022-04-26 NOTE — Telephone Encounter (Signed)
?  Follow up Call- ? ? ?  04/23/2022  ?  1:10 PM 09/16/2021  ? 10:18 AM  ?Call back number  ?Post procedure Call Back phone  # [phone]  ?Permission to leave phone message Yes Yes  ?  ? ?Patient questions: ? ?Do you have a fever, pain , or abdominal swelling? No. ?Pain Score  0 * ? ?Have you tolerated food without any problems? Yes.   ? ?Have you been able to return to your normal activities? Yes.   ? ?Do you have any questions about your discharge instructions: ?Diet   No. ?Medications  No. ?Follow up visit  No. ? ?Do you have questions or concerns about your Care? No. ? ?Actions: ?* If pain score is 4 or above: ?No action needed, pain <4. ? ? ?

## 2022-04-30 ENCOUNTER — Encounter: Payer: Self-pay | Admitting: Gastroenterology

## 2022-05-04 ENCOUNTER — Telehealth: Payer: Self-pay | Admitting: Gastroenterology

## 2022-05-04 NOTE — Telephone Encounter (Signed)
PATIENT CALLED IN REGARDING TO OMEPRAZOLE ( PRILOSEC) PRESCRIPTION. PATIENT IS STATING THE PHARMACY DID NOT GIVE HER THE RIGHT AMOUNT FOR A 30 DAY SUPPLY. PATIENT ALSO STATES SHE CANNOT REFILL THE PRESCRIPTION. PATIENT WILL BE GOING OUT OF TOWN AND WANTS TO MAKE SURE THAT SHE HAS ENOUGH OF THE MEDICATION TO LAST. PLEASE GIVE A CALL BACK TO [facility]ISE. ?

## 2022-05-05 ENCOUNTER — Other Ambulatory Visit: Payer: Self-pay | Admitting: Physician Assistant

## 2022-05-05 NOTE — Telephone Encounter (Signed)
Please submit.

## 2022-05-05 NOTE — Telephone Encounter (Signed)
Spoke with patient and she spoke with [facility]. Her insurance will not cover twice a day omeprazole. She has to have a Qty exception done through her insurance. SHe said [facility] was going to fax [place] over the form to get that done  ?

## 2022-05-06 ENCOUNTER — Telehealth: Payer: Self-pay | Admitting: *Deleted

## 2022-05-06 NOTE — Telephone Encounter (Signed)
Error erroneous encounter

## 2022-05-08 ENCOUNTER — Telehealth: Payer: Self-pay | Admitting: Pharmacy Technician

## 2022-05-08 ENCOUNTER — Other Ambulatory Visit (HOSPITAL_COMMUNITY): Payer: Self-pay

## 2022-05-08 NOTE — Telephone Encounter (Signed)
PA has been submitted.

## 2022-05-08 NOTE — Telephone Encounter (Signed)
Patient Advocate Encounter  Received notification from [place]  that prior authorization for OMEPRAZOLE '40MG'$  is required.   PA submitted on 5.20.23 Key  [id number] Status is pending   [facility] will continue to follow  Luciano Cutter, CPhT Patient Advocate Phone: [phone]

## 2022-05-13 ENCOUNTER — Other Ambulatory Visit (HOSPITAL_COMMUNITY): Payer: Self-pay

## 2022-05-13 NOTE — Telephone Encounter (Signed)
Received notification from [place] regarding a prior authorization for OMEPRAZOLE '40MG'$ . Authorization has been APPROVED from 5.21.23 to 5.21.2024.   Per test claim, copay for 30 days supply is $2.65

## 2022-05-13 NOTE — Telephone Encounter (Signed)
See note  Prior auth approved by authorization team

## 2022-05-13 NOTE — Progress Notes (Signed)
Reviewed and agree with documentation and assessment and plan. K. Veena Symone Cornman , MD   

## 2022-06-13 IMAGING — CR DG CHEST 2V
2 series · 2 of 2 positions shown · non-contrast
Comparison: December 27, 2019.

CLINICAL DATA: Cough.  Shortness of breath.

EXAM:
CHEST - 2 VIEW

[chest pa]
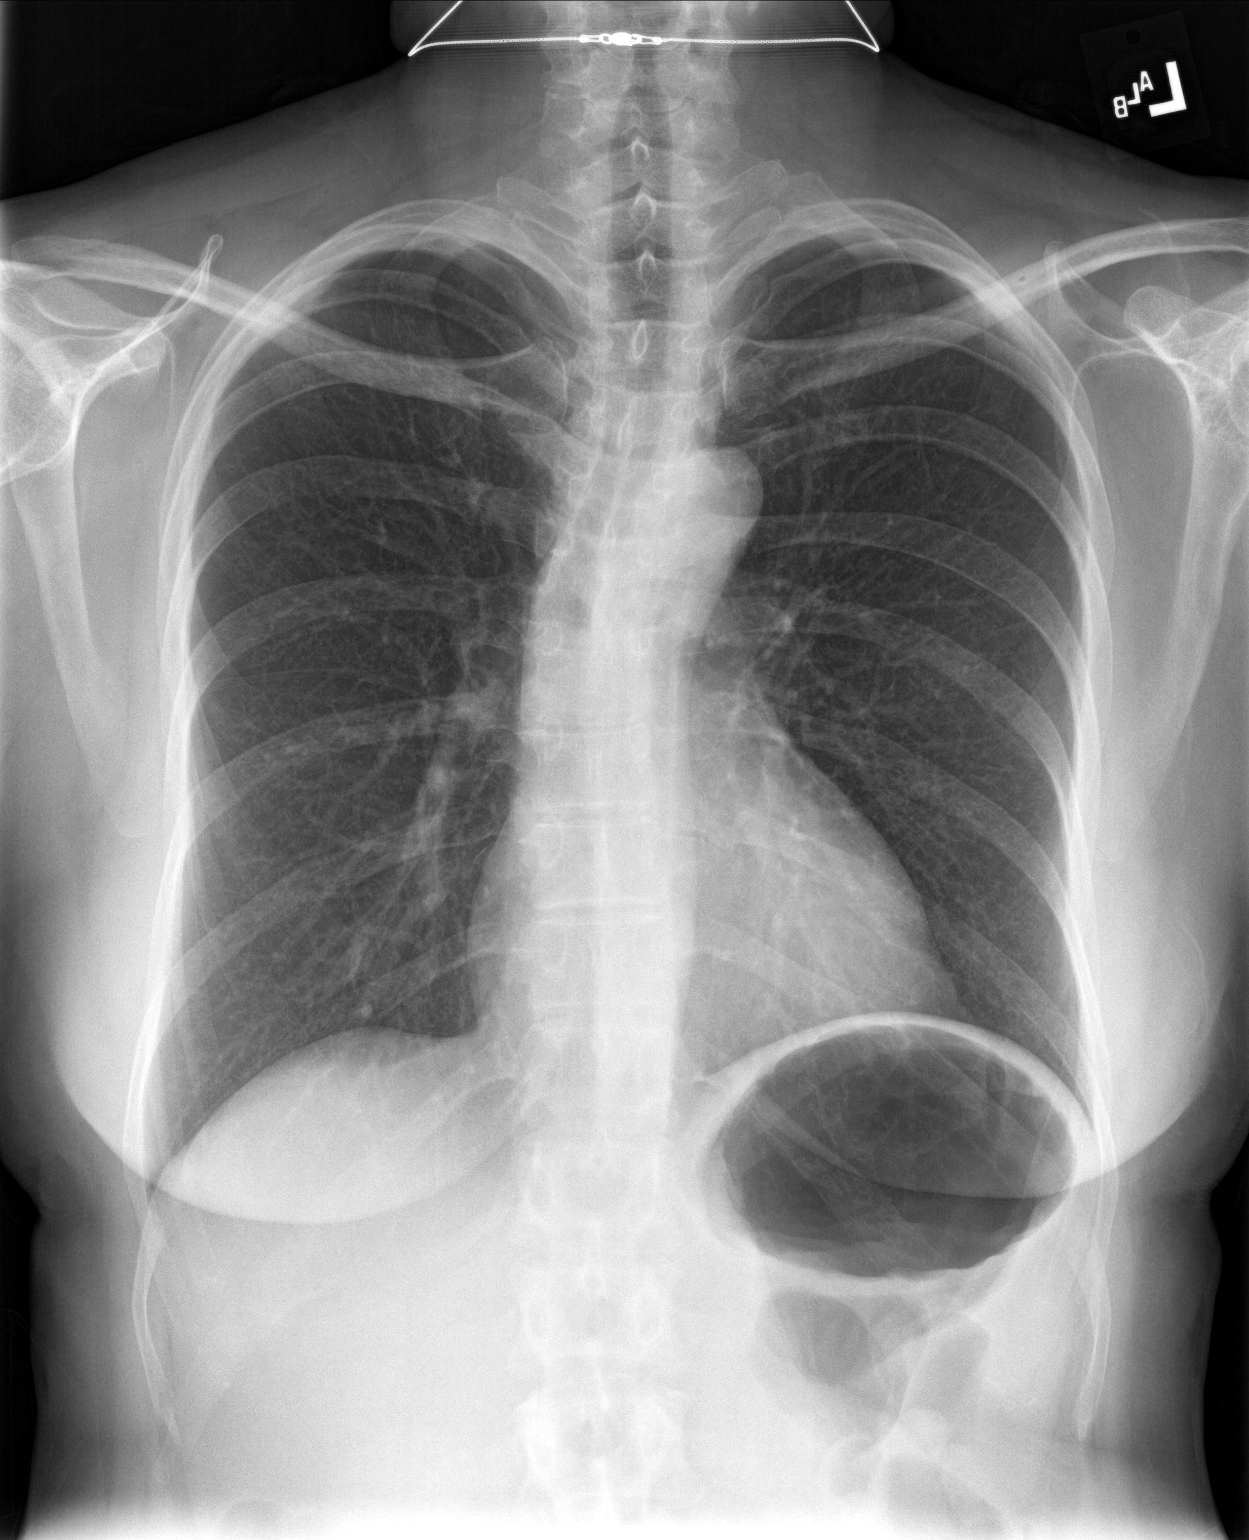

[chest lat]
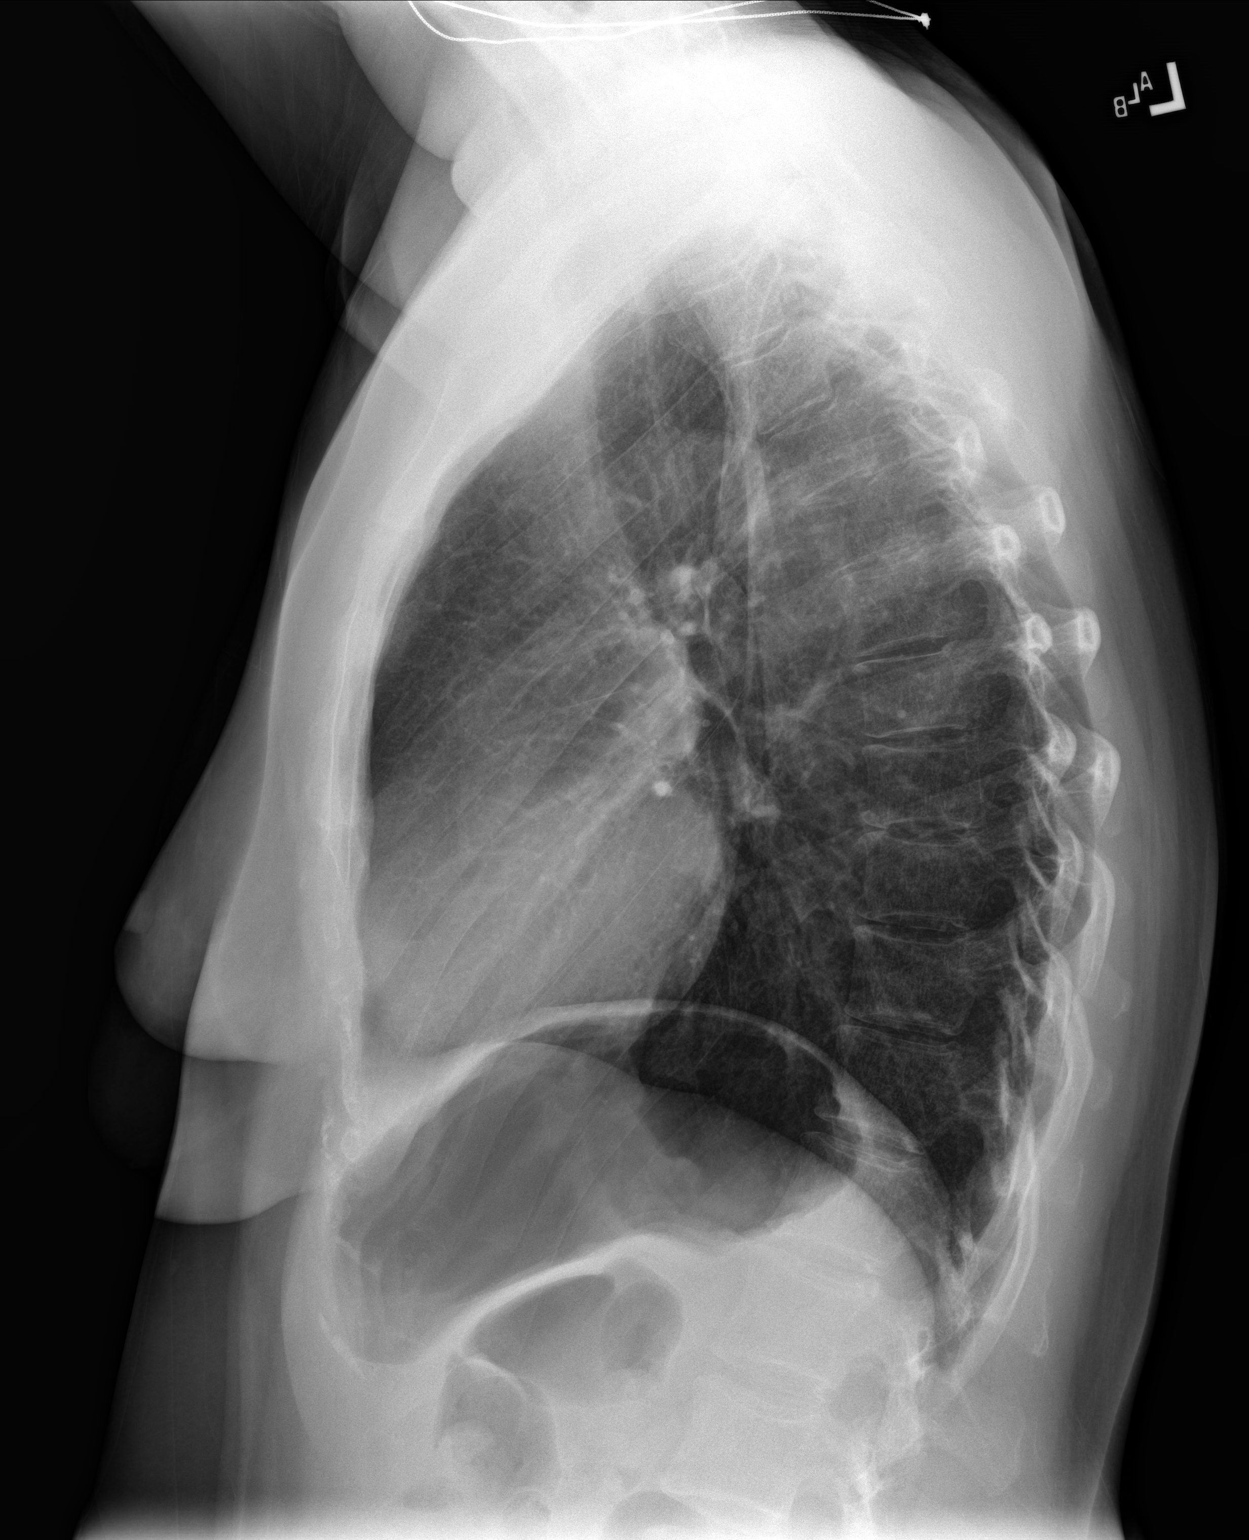

[2 of 2 positions shown; findings below may reference images not displayed]

FINDINGS: The heart size and mediastinal contours are within normal limits.
Both lungs are clear. No pneumothorax or pleural effusion is noted.
The visualized skeletal structures are unremarkable.
IMPRESSION: No active cardiopulmonary disease.

## 2022-06-17 ENCOUNTER — Ambulatory Visit (INDEPENDENT_AMBULATORY_CARE_PROVIDER_SITE_OTHER): Payer: Managed Care, Other (non HMO) | Admitting: Physician Assistant

## 2022-06-17 ENCOUNTER — Encounter: Payer: Self-pay | Admitting: Physician Assistant

## 2022-06-17 VITALS — BP 126/74 | HR 63 | Ht 67.0 in | Wt 167.1 lb

## 2022-06-17 DIAGNOSIS — R1011 Right upper quadrant pain: Secondary | ICD-10-CM | POA: Diagnosis not present

## 2022-06-17 DIAGNOSIS — K5900 Constipation, unspecified: Secondary | ICD-10-CM

## 2022-06-17 DIAGNOSIS — K2270 Barrett's esophagus without dysplasia: Secondary | ICD-10-CM

## 2022-06-17 DIAGNOSIS — K219 Gastro-esophageal reflux disease without esophagitis: Secondary | ICD-10-CM | POA: Diagnosis not present

## 2022-06-17 MED ORDER — OMEPRAZOLE 40 MG PO CPDR
40.0000 mg | DELAYED_RELEASE_CAPSULE | Freq: Two times a day (BID) | ORAL | 5 refills | Status: DC
Start: 2022-06-17 — End: 2023-02-21

## 2022-06-17 NOTE — Progress Notes (Signed)
Chief Complaint: Follow-up after EGD  HPI:    Abigail Kramer is a 56 year old Caucasian female, with a past medical history of anxiety and reflux, known to Dr. Silverio Decamp, who returns to clinic today for follow-up of her epigastric pain after recent EGD.      09/16/2021 colonoscopy for screening with one 4 mm polyp in the transverse colon, one less than 1 mm polyp in the transverse colon and nonbleeding internal hemorrhoids.  Pathology showed tubular adenomas and repeat recommended in 7 years.    02/12/2022 CT of the abdomen pelvis with contrast showed no acute intra-abdominal pathology.  CBC normal.  CMP normal.    04/19/2022 patient seen in clinic and at that time discussed epigastric and right upper quadrant pain.  Also discussed some constipation.  She was scheduled for an EGD and also her Omeprazole was increased to twice daily.  Recommend she start MiraLAX over-the-counter.  Did discuss possible gallbladder testing if work-up is negative.    04/23/2022 EGD with LA grade C reflux esophagitis with nodularity and no bleeding, diverticulum at the GE junction and a 5 cm hiatal hernia.  Otherwise normal to the duodenum.  Biopsies were positive for Barrett's esophagus.  At that time recommended repeat EGD in 6 months.    Today, the patient explains that as far as her bowel habits she is pretty much back to normal.  She does continue with a right upper quadrant pain that is pretty constant, she has tried to change her diet as much as she can to help with this and symptoms are slightly better.  She even stopped wearing underwire bras as this seemed to irritate it.  Does still continue though.    Does express that her reflux symptoms are better on the Omeprazole 40 mg twice a day.  Does asked some questions about her recent EGD.    Denies fever, chills, weight loss, nausea or vomiting.  Past Medical History:  Diagnosis Date   Anxiety    on meds   COVID-19    Depression    on meds   Fibroid 01/2009   small    GERD (gastroesophageal reflux disease)    on meds   HSV infection    oral - treated by dentist    Hx: UTI (urinary tract infection)    Irregular menses    Ovarian cyst 01/2009 and 04/2009    Past Surgical History:  Procedure Laterality Date   COLONOSCOPY  2011   DB-F/V-movi(good)-normal   PARTIAL HYSTERECTOMY  2014   TUBAL LIGATION  1991   WISDOM TOOTH EXTRACTION      Current Outpatient Medications  Medication Sig Dispense Refill   albuterol (VENTOLIN HFA) 108 (90 Base) MCG/ACT inhaler Inhale 1-2 puffs into the lungs every 6 (six) hours as needed for wheezing or shortness of breath. 8 g 0   liothyronine (CYTOMEL) 5 MCG tablet 1 tablet on an empty stomach     omeprazole (PRILOSEC) 40 MG capsule TAKE 1 CAPSULE BY MOUTH TWICE A DAY 60 capsule 3   ondansetron (ZOFRAN) 4 MG tablet Take 1 tablet (4 mg total) by mouth every 8 (eight) hours as needed for nausea or vomiting. 20 tablet 0   Probiotic Product (ALIGN) 4 MG CAPS Take 1 capsule by mouth daily.     No current facility-administered medications for this visit.    Allergies as of 06/17/2022 - Review Complete 04/23/2022  Allergen Reaction Noted   Prozac [fluoxetine hcl]  04/10/2014    Family History  Problem Relation Age of Onset   Hypertension Mother    Breast cancer Mother    Lymphoma Mother    Myasthenia gravis Father    Colon polyps Brother 80   Hypertension Brother    Colon cancer Neg Hx    Esophageal cancer Neg Hx    Stomach cancer Neg Hx    Rectal cancer Neg Hx     Social History   Socioeconomic History   Marital status: Married    Spouse name: Not on file   Number of children: 2   Years of education: Not on file   Highest education level: Not on file  Occupational History   Occupation: Sales  Tobacco Use   Smoking status: Never   Smokeless tobacco: Never  Vaping Use   Vaping Use: Never used  Substance and Sexual Activity   Alcohol use: Yes    Alcohol/week: 4.0 standard drinks of alcohol     Types: 4 Standard drinks or equivalent per week    Comment: occassionally   Drug use: No   Sexual activity: Not on file  Other Topics Concern   Not on file  Social History Narrative   Not on file   Social Determinants of Health   Financial Resource Strain: Not on file  Food Insecurity: Not on file  Transportation Needs: Not on file  Physical Activity: Not on file  Stress: Not on file  Social Connections: Not on file  Intimate Partner Violence: Not on file    Review of Systems:    Constitutional: No weight loss, fever or chills Cardiovascular: No chest pain  Respiratory: No SOB  Gastrointestinal: See HPI and otherwise negative   Physical Exam:  Vital signs: BP 126/74   Pulse 63   Ht '5\' 7"'$  (1.702 m)   Wt 167 lb 2 oz (75.8 kg)   LMP 12/20/2008   BMI 26.18 kg/m    Constitutional:   Pleasant Caucasian female appears to be in NAD, Well developed, Well nourished, alert and cooperative Respiratory: Respirations even and unlabored. Lungs clear to auscultation bilaterally.   No wheezes, crackles, or rhonchi.  Cardiovascular: Normal S1, S2. No MRG. Regular rate and rhythm. No peripheral edema, cyanosis or pallor.  Gastrointestinal:  Soft, nondistended, nontender. No rebound or guarding. Normal bowel sounds. No appreciable masses or hepatomegaly. Rectal:  Not performed.  Psychiatric: Oriented to person, place and time. Demonstrates good judgement and reason without abnormal affect or behaviors.  RELEVANT LABS AND IMAGING: CBC    Component Value Date/Time   WBC 8.0 02/12/2022 0903   RBC 4.55 02/12/2022 0903   HGB 13.4 02/12/2022 0903   HCT 40.4 02/12/2022 0903   PLT 318.0 02/12/2022 0903   MCV 89.0 02/12/2022 0903   MCH 30.1 01/09/2016 1621   MCHC 33.2 02/12/2022 0903   RDW 14.9 02/12/2022 0903   LYMPHSABS 2.1 02/12/2022 0903   MONOABS 0.6 02/12/2022 0903   EOSABS 0.1 02/12/2022 0903   BASOSABS 0.0 02/12/2022 0903    CMP     Component Value Date/Time   NA 139  02/12/2022 0903   K 4.3 02/12/2022 0903   CL 103 02/12/2022 0903   CO2 32 02/12/2022 0903   GLUCOSE 90 02/12/2022 0903   BUN 7 02/12/2022 0903   CREATININE 0.71 02/12/2022 0903   CREATININE 0.73 01/09/2016 1621   CALCIUM 8.8 02/12/2022 0903   PROT 7.4 03/24/2022 0739   ALBUMIN 4.5 03/24/2022 0739   AST 19 03/24/2022 0739   ALT 16 03/24/2022 0739  ALKPHOS 61 03/24/2022 0739   BILITOT 0.5 03/24/2022 0739   GFRNONAA >60 03/07/2020 1042   GFRAA >60 03/07/2020 1042    Assessment: 1.  Right upper quadrant pain: Continues per patient, may be slightly better since she stopped wearing underwire bra; consider gallbladder etiology versus musculoskeletal versus other 2.  Barrett's esophagus: Found at time of recent EGD with recommendations for repeat exam in 6 months 3.  Constipation: Resolved  Plan: 1.  Scheduled patient for right upper quadrant ultrasound.  Pending results may need to order HIDA scan with CCK. 2.  Continue Omeprazole 40 mg twice daily, 30-60 minutes before breakfast and dinner.  Prescribed #60 with 5 refills. 3.  Patient was placed in recall for EGD in November for follow-up of Barrett's esophagus. 4.  Patient to follow in clinic with me in 2 months or sooner if necessary.  Abigail Newer, PA-C [facility] 06/17/2022, 9:54 AM  Cc: Tower, Wynelle Fanny, MD

## 2022-06-17 NOTE — Patient Instructions (Signed)
You have been scheduled for an RUQ abdominal ultrasound at [facility] (1st floor of hospital) on July 5th  at 11:00 am . Please arrive 15 minutes prior to your appointment for registration. Make certain not to have anything to eat or drink 6 hours prior to your appointment. Should you need to reschedule your appointment, please contact radiology at [phone]. This test typically takes about 30 minutes to perform.   We have sent the following medications to your pharmacy for you to pick up at your convenience:    Omeprazole 40 mg   Due to recent changes in healthcare laws, you may see the results of your imaging and laboratory studies on MyChart before your provider has had a chance to review them.  We understand that in some cases there may be results that are confusing or concerning to you. Not all laboratory results come back in the same time frame and the provider may be waiting for multiple results in order to interpret others.  Please give [place] 48 hours in order for your provider to thoroughly review all the results before contacting the office for clarification of your results.    We have sent the following medications to your pharmacy for you to pick up at your convenience:   I appreciate the  opportunity to care for you  Thank You   [facility]

## 2022-06-23 ENCOUNTER — Ambulatory Visit (HOSPITAL_COMMUNITY)
Admission: RE | Admit: 2022-06-23 | Discharge: 2022-06-23 | Disposition: A | Discharge status: Home / Self Care | Payer: Managed Care, Other (non HMO) | Source: Ambulatory Visit | Attending: Physician Assistant | Admitting: Physician Assistant

## 2022-06-23 ENCOUNTER — Other Ambulatory Visit: Payer: Self-pay

## 2022-06-23 DIAGNOSIS — R1011 Right upper quadrant pain: Secondary | ICD-10-CM

## 2022-07-12 ENCOUNTER — Encounter
Admission: RE | Admit: 2022-07-12 | Discharge: 2022-07-12 | Disposition: A | Discharge status: Home / Self Care | Payer: Managed Care, Other (non HMO) | Source: Ambulatory Visit | Attending: Physician Assistant | Admitting: Physician Assistant

## 2022-07-12 DIAGNOSIS — R1011 Right upper quadrant pain: Secondary | ICD-10-CM | POA: Diagnosis present

## 2022-07-12 MED ORDER — TECHNETIUM TC 99M MEBROFENIN IV KIT
5.0000 | PACK | Freq: Once | INTRAVENOUS | Status: AC | PRN
  Administered 2022-07-12: 5.13 via INTRAVENOUS

## 2022-07-12 MED ORDER — SINCALIDE 5 MCG IJ SOLR
0.0200 ug/kg | Freq: Once | INTRAMUSCULAR | Status: AC
  Administered 2022-07-12: 1.52 ug via INTRAVENOUS

## 2022-07-20 ENCOUNTER — Encounter: Payer: Self-pay | Admitting: Physician Assistant

## 2022-07-20 ENCOUNTER — Ambulatory Visit (INDEPENDENT_AMBULATORY_CARE_PROVIDER_SITE_OTHER): Payer: Managed Care, Other (non HMO) | Admitting: Physician Assistant

## 2022-07-20 VITALS — BP 124/80 | HR 68 | Ht 67.0 in | Wt 169.4 lb

## 2022-07-20 DIAGNOSIS — R1011 Right upper quadrant pain: Secondary | ICD-10-CM | POA: Diagnosis not present

## 2022-07-20 DIAGNOSIS — G8929 Other chronic pain: Secondary | ICD-10-CM | POA: Diagnosis not present

## 2022-07-20 DIAGNOSIS — K2270 Barrett's esophagus without dysplasia: Secondary | ICD-10-CM | POA: Diagnosis not present

## 2022-07-20 NOTE — Progress Notes (Signed)
Chief Complaint: Right upper quadrant pain, constipation and GERD  HPI:    Abigail Kramer is a 56 year old Caucasian female, known to Dr. Silverio Decamp, with a past medical history as listed below including reflux and anxiety, who returns to clinic today for follow-up of her right upper quadrant pain, constipation and GERD.     09/16/2021 colonoscopy for screening with one 4 mm polyp in the transverse colon, one less than 1 mm polyp in the transverse colon and nonbleeding internal hemorrhoids.  Pathology showed tubular adenomas and repeat recommended in 7 years.    02/12/2022 CT of the abdomen pelvis with contrast showed no acute intra-abdominal pathology.  CBC normal.  CMP normal.    04/19/2022 patient seen in clinic and at that time discussed epigastric and right upper quadrant pain.  Also discussed some constipation.  She was scheduled for an EGD and also her Omeprazole was increased to twice daily.  Recommend she start MiraLAX over-the-counter.  Did discuss possible gallbladder testing if work-up is negative.    04/23/2022 EGD with LA grade C reflux esophagitis with nodularity and no bleeding, diverticulum at the GE junction and a 5 cm hiatal hernia.  Otherwise normal to the duodenum.  Biopsies were positive for Barrett's esophagus.  At that time recommended repeat EGD in 6 months.    06/17/2022 patient followed in clinic for her right upper quadrant pain, constipation and GERD.  Her bowel habits are back to normal.  She continued with right upper quadrant pain though that was pretty constant.  Reflux also under better control with Omeprazole 40 twice daily.  At that time recommended patient repeat EGD in November for follow-up of Barrett's esophagus.  She was continued on Omeprazole 40 twice daily and scheduled for right upper quadrant ultrasound for continued right upper quadrant pain.    06/23/2022 right upper quadrant ultrasound with a small gallbladder polyp-no follow-up required otherwise normal.    07/12/2022  HIDA scan with CCK showed patent cystic and common bile ducts and normal gallbladder ejection fraction.    Today, patient tells me she continues with the same right upper quadrant discomfort which is always there and sometimes feels like there is a ball up under her ribs that radiates into her back.  This has been unchanged.  Does tell me she experienced some of this discomfort when they did the HIDA scan and injected the CCK, but she was not sure it was any more than normal she was just more aware since that is what they were doing.    Reflux and constipation continue to be under control.    Denies fever, chills, weight loss or change in bowel habits.  Past Medical History:  Diagnosis Date   Anxiety    on meds   COVID-19    Depression    on meds   Fibroid 01/2009   small   GERD (gastroesophageal reflux disease)    on meds   HSV infection    oral - treated by dentist    Hx: UTI (urinary tract infection)    Irregular menses    Ovarian cyst 01/2009 and 04/2009    Past Surgical History:  Procedure Laterality Date   COLONOSCOPY  2011   DB-F/V-movi(good)-normal   PARTIAL HYSTERECTOMY  2014   TUBAL LIGATION  1991   UPPER GASTROINTESTINAL ENDOSCOPY     WISDOM TOOTH EXTRACTION      Current Outpatient Medications  Medication Sig Dispense Refill   albuterol (VENTOLIN HFA) 108 (90 Base) MCG/ACT inhaler  Inhale 1-2 puffs into the lungs every 6 (six) hours as needed for wheezing or shortness of breath. 8 g 0   liothyronine (CYTOMEL) 5 MCG tablet 1 tablet on an empty stomach     omeprazole (PRILOSEC) 40 MG capsule Take 1 capsule (40 mg total) by mouth 2 (two) times daily. 60 capsule 5   ondansetron (ZOFRAN) 4 MG tablet Take 1 tablet (4 mg total) by mouth every 8 (eight) hours as needed for nausea or vomiting. 20 tablet 0   No current facility-administered medications for this visit.    Allergies as of 07/20/2022   (No Active Allergies)    Family History  Problem Relation Age of Onset    Hypertension Mother    Breast cancer Mother    Lymphoma Mother    Myasthenia gravis Father    Colon polyps Brother 59   Hypertension Brother    Colon cancer Neg Hx    Esophageal cancer Neg Hx    Stomach cancer Neg Hx    Rectal cancer Neg Hx    Pancreatic cancer Neg Hx     Social History   Socioeconomic History   Marital status: Married    Spouse name: Not on file   Number of children: 2   Years of education: Not on file   Highest education level: Not on file  Occupational History   Occupation: Sales  Tobacco Use   Smoking status: Never   Smokeless tobacco: Never  Vaping Use   Vaping Use: Never used  Substance and Sexual Activity   Alcohol use: Yes    Alcohol/week: 4.0 standard drinks of alcohol    Types: 4 Standard drinks or equivalent per week    Comment: occassionally   Drug use: No   Sexual activity: Not on file  Other Topics Concern   Not on file  Social History Narrative   Not on file   Social Determinants of Health   Financial Resource Strain: Not on file  Food Insecurity: Not on file  Transportation Needs: Not on file  Physical Activity: Not on file  Stress: Not on file  Social Connections: Not on file  Intimate Partner Violence: Not on file    Review of Systems:    Constitutional: No weight loss, fever or chills Cardiovascular: No chest pain Respiratory: No SOB  Gastrointestinal: See HPI and otherwise negative   Physical Exam:  Vital signs: BP 124/80 (BP Location: Left Arm, Patient Position: Sitting, Cuff Size: Normal)   Pulse 68   Ht '5\' 7"'$  (1.702 m) Comment: height measured without shoes  Wt 169 lb 6 oz (76.8 kg)   LMP 12/20/2008   BMI 26.53 kg/m    Constitutional:   Pleasant Caucasian female appears to be in NAD, Well developed, Well nourished, alert and cooperative Respiratory: Respirations even and unlabored. Lungs clear to auscultation bilaterally.   No wheezes, crackles, or rhonchi.  Cardiovascular: Normal S1, S2. No MRG. Regular  rate and rhythm. No peripheral edema, cyanosis or pallor.  Gastrointestinal:  Soft, nondistended, mil RUQ ttp. No rebound or guarding. Normal bowel sounds. No appreciable masses or hepatomegaly. Rectal:  Not performed.  Psychiatric: Demonstrates good judgement and reason without abnormal affect or behaviors.  RELEVANT LABS AND IMAGING: CBC    Component Value Date/Time   WBC 8.0 02/12/2022 0903   RBC 4.55 02/12/2022 0903   HGB 13.4 02/12/2022 0903   HCT 40.4 02/12/2022 0903   PLT 318.0 02/12/2022 0903   MCV 89.0 02/12/2022 0903   MCH  30.1 01/09/2016 1621   MCHC 33.2 02/12/2022 0903   RDW 14.9 02/12/2022 0903   LYMPHSABS 2.1 02/12/2022 0903   MONOABS 0.6 02/12/2022 0903   EOSABS 0.1 02/12/2022 0903   BASOSABS 0.0 02/12/2022 0903    CMP     Component Value Date/Time   NA 139 02/12/2022 0903   K 4.3 02/12/2022 0903   CL 103 02/12/2022 0903   CO2 32 02/12/2022 0903   GLUCOSE 90 02/12/2022 0903   BUN 7 02/12/2022 0903   CREATININE 0.71 02/12/2022 0903   CREATININE 0.73 01/09/2016 1621   CALCIUM 8.8 02/12/2022 0903   PROT 7.4 03/24/2022 0739   ALBUMIN 4.5 03/24/2022 0739   AST 19 03/24/2022 0739   ALT 16 03/24/2022 0739   ALKPHOS 61 03/24/2022 0739   BILITOT 0.5 03/24/2022 0739   GFRNONAA >60 03/07/2020 1042   GFRAA >60 03/07/2020 1042    Assessment: 1.  Right upper quadrant pain: Chronic at this point, evaluated with EGD, colonoscopy, CT, ultrasound and HIDA scan all unrevealing; consider musculoskeletal versus anxiety versus biliary dyskinesia 2.  Barrett's esophagus: Found at time of recent EGD, repeat recommended in November of this year, patient is in recall  Plan: 1.  Patient discusses that she does have back problems and wonders sometimes if her posture causes discomfort in that area.  Explained that we have tested her in and out and up and down and not found anything wrong.  At this point her options are that it is a musculoskeletal etiology or that it is biliary  dyskinesia or functional.  Discussed possible referral to [facility] for further discussion of cholecystectomy but patient declines at the moment.  Recommend that she try 2 Aleve and see if this helps at all.  If it does help would recommend she get referral to sports medicine for further evaluation of musculoskeletal pain. 2.  Patient will follow with [place] in November for her repeat EGD.  Ellouise Newer, PA-C [facility] 07/20/2022, 10:47 AM  Cc: Tower, Wynelle Fanny, MD

## 2022-07-20 NOTE — Patient Instructions (Signed)
If you are age 57 or older, your body mass index should be between 23-30. Your Body mass index is 26.53 kg/m. If this is out of the aforementioned range listed, please consider follow up with your Primary Care Provider.  If you are age 64 or younger, your body mass index should be between 19-25. Your Body mass index is 26.53 kg/m. If this is out of the aformentioned range listed, please consider follow up with your Primary Care Provider.   The [facility] providers would like to encourage you to use [facility] to communicate with providers for non-urgent requests or questions.  Due to long hold times on the telephone, sending your provider a message by [facility] may be a faster and more efficient way to get a response.  Please allow 48 business hours for a response.  Please remember that this is for non-urgent requests.   It was a pleasure to see you today!  Thank you for trusting me with your gastrointestinal care!    Ellouise Newer, PA-C

## 2022-10-29 ENCOUNTER — Other Ambulatory Visit: Payer: Self-pay

## 2022-10-29 ENCOUNTER — Ambulatory Visit (AMBULATORY_SURGERY_CENTER): Payer: Managed Care, Other (non HMO) | Admitting: *Deleted

## 2022-10-29 VITALS — Ht 67.0 in | Wt 167.0 lb

## 2022-10-29 DIAGNOSIS — K2270 Barrett's esophagus without dysplasia: Secondary | ICD-10-CM

## 2022-10-29 NOTE — Progress Notes (Signed)
Pre visit completed over telephone.Instructions forwarded through MyChart   No egg or soy allergy known to patient  No issues known to pt with past sedation with any surgeries or procedures Patient denies ever being told they had issues or difficulty with intubation  No FH of Malignant Hyperthermia Pt is not on diet pills Pt is not on  home 02  Pt is not on blood thinners  Pt denies issues with constipation  No A fib or A flutter  Pt instructed to use [email or web address] or GoodRx for a price reduction on prep

## 2022-11-03 ENCOUNTER — Encounter: Payer: Self-pay | Admitting: Gastroenterology

## 2022-11-05 ENCOUNTER — Ambulatory Visit: Payer: Managed Care, Other (non HMO) | Admitting: Gastroenterology

## 2022-11-05 ENCOUNTER — Encounter: Payer: Self-pay | Admitting: Gastroenterology

## 2022-11-05 VITALS — BP 128/75 | HR 60 | Temp 98.4°F | Resp 22 | Ht 67.0 in | Wt 173.8 lb

## 2022-11-05 DIAGNOSIS — K2100 Gastro-esophageal reflux disease with esophagitis, without bleeding: Secondary | ICD-10-CM

## 2022-11-05 DIAGNOSIS — K2970 Gastritis, unspecified, without bleeding: Secondary | ICD-10-CM | POA: Diagnosis not present

## 2022-11-05 DIAGNOSIS — R1319 Other dysphagia: Secondary | ICD-10-CM

## 2022-11-05 DIAGNOSIS — K222 Esophageal obstruction: Secondary | ICD-10-CM | POA: Diagnosis not present

## 2022-11-05 DIAGNOSIS — K449 Diaphragmatic hernia without obstruction or gangrene: Secondary | ICD-10-CM | POA: Diagnosis not present

## 2022-11-05 DIAGNOSIS — K319 Disease of stomach and duodenum, unspecified: Secondary | ICD-10-CM | POA: Diagnosis not present

## 2022-11-05 DIAGNOSIS — K2270 Barrett's esophagus without dysplasia: Secondary | ICD-10-CM

## 2022-11-05 DIAGNOSIS — G8929 Other chronic pain: Secondary | ICD-10-CM

## 2022-11-05 MED ORDER — SODIUM CHLORIDE 0.9 % IV SOLN: 500.0000 mL | Freq: Once | INTRAVENOUS | Status: DC

## 2022-11-05 NOTE — Progress Notes (Signed)
Pt's states no medical or surgical changes since previsit or office visit. VS assessed by C.W 

## 2022-11-05 NOTE — Progress Notes (Signed)
[facility] History and Physical   Primary Care Physician:  Tower, Wynelle Fanny, MD   Reason for Procedure:  Dysphagia, Barrett's surveillance  Plan:    EGD  with possible interventions as needed     HPI: Abigail Kramer is a very pleasant 56 y.o. female here for EGD for Barrett's surveillance and dysphagia. Denies any nausea, vomiting, abdominal pain, melena or bright red blood per rectum  The risks and benefits as well as alternatives of endoscopic procedure(s) have been discussed and reviewed. All questions answered. The patient agrees to proceed.    Past Medical History:  Diagnosis Date   Anxiety    on meds   COVID-19    Depression    on meds   Fibroid 01/2009   small   GERD (gastroesophageal reflux disease)    on meds   HSV infection    oral - treated by dentist    Hx: UTI (urinary tract infection)    Irregular menses    Ovarian cyst 01/2009 and 04/2009    Past Surgical History:  Procedure Laterality Date   COLONOSCOPY  2011   DB-F/V-movi(good)-normal   PARTIAL HYSTERECTOMY  2014   TUBAL LIGATION  1991   UPPER GASTROINTESTINAL ENDOSCOPY     WISDOM TOOTH EXTRACTION      Prior to Admission medications   Medication Sig Start Date End Date Taking? Authorizing Provider  liothyronine (CYTOMEL) 5 MCG tablet 1 tablet on an empty stomach 12/29/21  Yes [provider]  omeprazole (PRILOSEC) 40 MG capsule Take 1 capsule (40 mg total) by mouth 2 (two) times daily. 06/17/22  Yes Levin Erp, PA  albuterol (VENTOLIN HFA) 108 (90 Base) MCG/ACT inhaler Inhale 1-2 puffs into the lungs every 6 (six) hours as needed for wheezing or shortness of breath. 12/24/21   Perlie Mayo, NP  famotidine (PEPCID) 20 MG tablet Take 20 mg by mouth 2 (two) times daily. 08/06/22   [provider]  fluconazole (DIFLUCAN) 150 MG tablet Take by mouth. Patient not taking: Reported on 11/05/2022 09/24/22   [provider]  ondansetron (ZOFRAN) 4 MG tablet  Take 1 tablet (4 mg total) by mouth every 8 (eight) hours as needed for nausea or vomiting. Patient not taking: Reported on 11/05/2022 02/12/22   Eugenia Pancoast, FNP    Current Outpatient Medications  Medication Sig Dispense Refill   liothyronine (CYTOMEL) 5 MCG tablet 1 tablet on an empty stomach     omeprazole (PRILOSEC) 40 MG capsule Take 1 capsule (40 mg total) by mouth 2 (two) times daily. 60 capsule 5   albuterol (VENTOLIN HFA) 108 (90 Base) MCG/ACT inhaler Inhale 1-2 puffs into the lungs every 6 (six) hours as needed for wheezing or shortness of breath. 8 g 0   famotidine (PEPCID) 20 MG tablet Take 20 mg by mouth 2 (two) times daily.     fluconazole (DIFLUCAN) 150 MG tablet Take by mouth. (Patient not taking: Reported on 11/05/2022)     ondansetron (ZOFRAN) 4 MG tablet Take 1 tablet (4 mg total) by mouth every 8 (eight) hours as needed for nausea or vomiting. (Patient not taking: Reported on 11/05/2022) 20 tablet 0   Current Facility-Administered Medications  Medication Dose Route Frequency Provider Last Rate Last Admin   0.9 %  sodium chloride infusion  500 mL Intravenous Once Mauri Pole, MD        Allergies as of 11/05/2022   (No Known Allergies)    Family History  Problem Relation  Age of Onset   Hypertension Mother    Breast cancer Mother    Lymphoma Mother    Myasthenia gravis Father    Colon polyps Brother 91   Hypertension Brother    Colon cancer Neg Hx    Esophageal cancer Neg Hx    Stomach cancer Neg Hx    Rectal cancer Neg Hx    Pancreatic cancer Neg Hx     Social History   Socioeconomic History   Marital status: Married    Spouse name: Not on file   Number of children: 2   Years of education: Not on file   Highest education level: Not on file  Occupational History   Occupation: Sales  Tobacco Use   Smoking status: Never   Smokeless tobacco: Never  Vaping Use   Vaping Use: Never used  Substance and Sexual Activity   Alcohol use: Yes     Alcohol/week: 4.0 standard drinks of alcohol    Types: 4 Standard drinks or equivalent per week    Comment: occassionally   Drug use: No   Sexual activity: Not on file  Other Topics Concern   Not on file  Social History Narrative   Not on file   Social Determinants of Health   Financial Resource Strain: Not on file  Food Insecurity: Not on file  Transportation Needs: Not on file  Physical Activity: Not on file  Stress: Not on file  Social Connections: Not on file  Intimate Partner Violence: Not on file    Review of Systems:  All other review of systems negative except as mentioned in the HPI.  Physical Exam: Vital signs in last 24 hours: Blood Pressure (Abnormal) 144/73   Pulse 70   Temperature 98.4 F (36.9 C) (Skin)   Height '5\' 7"'$  (1.702 m)   Weight 173 lb 12.8 oz (78.8 kg)   Last Menstrual Period 12/20/2008   Oxygen Saturation 99%   Body Mass Index 27.22 kg/m  General:   Alert, NAD Lungs:  Clear .   Heart:  Regular rate and rhythm Abdomen:  Soft, nontender and nondistended. Neuro/Psych:  Alert and cooperative. Normal mood and affect. A and O x 3  Reviewed labs, radiology imaging, old records and pertinent past GI work up  Patient is appropriate for planned procedure(s) and anesthesia in an ambulatory setting   K. Denzil Magnuson , MD [phone]

## 2022-11-05 NOTE — Patient Instructions (Signed)
Impression/Recommendations:  Hiatal hernia, gastritis, Barrett's esophagus, and dilation diet handouts given to patient,  Resume previous diet. Continue present medications. Await pathology results.  Repeat upper endoscopy in 3 years for surveillance of Barrett's esophagus.  YOU HAD AN ENDOSCOPIC PROCEDURE TODAY AT [facility]:   Refer to the procedure report that was given to you for any specific questions about what was found during the examination.  If the procedure report does not answer your questions, please call your gastroenterologist to clarify.  If you requested that your care partner not be given the details of your procedure findings, then the procedure report has been included in a sealed envelope for you to review at your convenience later.  YOU SHOULD EXPECT: Some feelings of bloating in the abdomen. Passage of more gas than usual.  Walking can help get rid of the air that was put into your GI tract during the procedure and reduce the bloating. If you had a lower endoscopy (such as a colonoscopy or flexible sigmoidoscopy) you may notice spotting of blood in your stool or on the toilet paper. If you underwent a bowel prep for your procedure, you may not have a normal bowel movement for a few days.  Please Note:  You might notice some irritation and congestion in your nose or some drainage.  This is from the oxygen used during your procedure.  There is no need for concern and it should clear up in a day or so.  SYMPTOMS TO REPORT IMMEDIATELY:  Following upper endoscopy (EGD)  Vomiting of blood or coffee ground material  New chest pain or pain under the shoulder blades  Painful or persistently difficult swallowing  New shortness of breath  Fever of 100F or higher  Black, tarry-looking stools  For urgent or emergent issues, a gastroenterologist can be reached at any hour by calling [phone]. Do not use MyChart messaging for urgent concerns.    DIET:   We do recommend a small meal at first, but then you may proceed to your regular diet.  Drink plenty of fluids but you should avoid alcoholic beverages for 24 hours.  ACTIVITY:  You should plan to take it easy for the rest of today and you should NOT DRIVE or use heavy machinery until tomorrow (because of the sedation medicines used during the test).    FOLLOW UP: Our staff will call the number listed on your records the next business day following your procedure.  We will call around 7:15- 8:00 am to check on you and address any questions or concerns that you may have regarding the information given to you following your procedure. If we do not reach you, we will leave a message.     If any biopsies were taken you will be contacted by phone or by letter within the next 1-3 weeks.  Please call us at [phone] if you have not heard about the biopsies in 3 weeks.    SIGNATURES/CONFIDENTIALITY: You and/or your care partner have signed paperwork which will be entered into your electronic medical record.  These signatures attest to the fact that that the information above on your After Visit Summary has been reviewed and is understood.  Full responsibility of the confidentiality of this discharge information lies with you and/or your care-partner.

## 2022-11-05 NOTE — Op Note (Signed)
[place] Patient Name: Abigail Kramer Procedure Date: 11/05/2022 3:44 PM MRN: [id number] Endoscopist: Mauri Pole , MD, [id number] Age: 56 Referring MD:  Date of Birth: 06-25-1966 Gender: Female Account #: [id number] Procedure:                Upper GI endoscopy Indications:              Dysphagia, Surveillance for malignancy due to                            personal history of Barrett's esophagus, Follow-up                            of reflux esophagitis Medicines:                Monitored Anesthesia Care Procedure:                Pre-Anesthesia Assessment:                           - Prior to the procedure, a History and Physical                            was performed, and patient medications and                            allergies were reviewed. The patient's tolerance of                            previous anesthesia was also reviewed. The risks                            and benefits of the procedure and the sedation                            options and risks were discussed with the patient.                            All questions were answered, and informed consent                            was obtained. Prior Anticoagulants: The patient has                            taken no anticoagulant or antiplatelet agents. ASA                            Grade Assessment: II - A patient with mild systemic                            disease. After reviewing the risks and benefits,                            the patient was deemed in satisfactory condition to  undergo the procedure.                           After obtaining informed consent, the endoscope was                            passed under direct vision. Throughout the                            procedure, the patient's blood pressure, pulse, and                            oxygen saturations were monitored continuously. The                            Endoscope was introduced  through the mouth, and                            advanced to the second part of duodenum. The upper                            GI endoscopy was accomplished without difficulty.                            The patient tolerated the procedure well. Scope In: Scope Out: Findings:                 There were esophageal mucosal changes secondary to                            established short-segment Barrett's disease present                            in the lower third of the esophagus. The maximum                            longitudinal extent of these mucosal changes was 3                            cm in length. Mucosa was biopsied with a cold                            forceps for histology in a targeted manner at                            intervals of 1 cm at 32 and 35 cm from the                            incisors. One specimen bottle was sent to pathology.                           One benign-appearing, intrinsic mild stenosis was  found 32 to 33 cm from the incisors. This stenosis                            measured 1.8 cm (inner diameter). The stenosis was                            traversed. A TTS dilator was passed through the                            scope. Dilation with an 18-19-20 mm balloon dilator                            was performed to 20 mm. The dilation site was                            examined and showed mild mucosal disruption.                           A 5 cm hiatal hernia was present.                           Patchy mild inflammation characterized by                            congestion (edema) and erythema was found in the                            gastric antrum and in the prepyloric region of the                            stomach. Biopsies were taken with a cold forceps                            for Helicobacter pylori testing.                           The cardia and gastric fundus were normal on                             retroflexion.                           The examined duodenum was normal. Complications:            No immediate complications. Estimated Blood Loss:     Estimated blood loss was minimal. Impression:               - Esophageal mucosal changes secondary to                            established short-segment Barrett's disease.                            Biopsied.                           -  Benign-appearing esophageal stenosis. Dilated.                           - 5 cm hiatal hernia.                           - Gastritis. Biopsied.                           - Normal examined duodenum. Recommendation:           - Patient has a contact number available for                            emergencies. The signs and symptoms of potential                            delayed complications were discussed with the                            patient. Return to normal activities tomorrow.                            Written discharge instructions were provided to the                            patient.                           - Resume previous diet.                           - Continue present medications.                           - Await pathology results.                           - Repeat upper endoscopy in 3 years for                            surveillance of Barrett's esophagus. Mauri Pole, MD 11/05/2022 4:20:31 PM This report has been signed electronically.

## 2022-11-05 NOTE — Progress Notes (Signed)
Called to room to assist during endoscopic procedure.  Patient ID and intended procedure confirmed with present staff. Received instructions for my participation in the procedure from the performing physician.  

## 2022-11-08 ENCOUNTER — Telehealth: Payer: Self-pay

## 2022-11-08 NOTE — Telephone Encounter (Signed)
  Follow up Call-     11/05/2022    2:58 PM 04/23/2022    1:10 PM 09/16/2021   10:18 AM  Call back number  Post procedure Call Back phone  # [phone]  Permission to leave phone message Yes Yes Yes     Patient questions:  Do you have a fever, pain , or abdominal swelling? No. Pain Score  0 *  Have you tolerated food without any problems? Yes.    Have you been able to return to your normal activities? Yes.    Do you have any questions about your discharge instructions: Diet   No. Medications  No. Follow up visit  No.  Do you have questions or concerns about your Care? No.  Actions: * If pain score is 4 or above: No action needed, pain <4.

## 2022-11-25 ENCOUNTER — Encounter: Payer: Self-pay | Admitting: Gastroenterology

## 2022-12-29 ENCOUNTER — Ambulatory Visit (INDEPENDENT_AMBULATORY_CARE_PROVIDER_SITE_OTHER): Payer: Managed Care, Other (non HMO) | Admitting: Family Medicine

## 2022-12-29 ENCOUNTER — Encounter: Payer: Self-pay | Admitting: Family Medicine

## 2022-12-29 VITALS — BP 128/80 | HR 68 | Temp 97.2°F | Ht 67.0 in | Wt 176.0 lb

## 2022-12-29 DIAGNOSIS — Z79899 Other long term (current) drug therapy: Secondary | ICD-10-CM

## 2022-12-29 DIAGNOSIS — F411 Generalized anxiety disorder: Secondary | ICD-10-CM | POA: Diagnosis not present

## 2022-12-29 MED ORDER — BUPROPION HCL ER (XL) 150 MG PO TB24: 150.0000 mg | ORAL_TABLET | Freq: Every day | ORAL | 3 refills | Status: DC

## 2022-12-29 NOTE — Patient Instructions (Addendum)
Start back on wellbutrin xl 150 mg daily in the am  If any intolerable side effects hold it and alert [place]  Same if you feel worse - mood wise   Give it a month to start working for mood and focus  Then update [place] with how you are   Keep exercising  Add some upper body strength training   Get your progesterone therapy updated also   Take care of yourself   Since you take omeprazole  Start Vit B12 1000 mcg daily  Magnesium 250 mg daily (unless it causes diarrhea)   Try to get 1200-1500 mg of calcium per day with at least 1000 iu of vitamin D - for bone health

## 2022-12-29 NOTE — Progress Notes (Signed)
Subjective:    Patient ID: Abigail Kramer, female    DOB: 1966/03/08, 57 y.o.   MRN: [id number]  HPI Pt presents to discuss focus and mood   Wt Readings from Last 3 Encounters:  12/29/22 176 lb (79.8 kg)  11/05/22 173 lb 12.8 oz (78.8 kg)  10/29/22 167 lb (75.8 kg)   27.57 kg/m  Vitals:   12/29/22 1123 12/29/22 1148  BP: (!) 142/96 128/80  Pulse: 68   Temp: (!) 97.2 F (36.2 C)   TempSrc: Temporal   SpO2: 100%   Weight: 176 lb (79.8 kg)   Height: '5\' 7"'$  (1.702 m)     Struggling with mood   Moody  Irritable Difficult to concentrate   Gets frustrated more than usual  Things that get on her nerves are bothering her more   Stress level is less than it was a year ago  Her work is better- new company   Hormonal status- blue sky  Pellets - testosterone and some progesterone (sub cutaeous)- a little overdue for repl  No estrogen   Has had a hysterectomy   Sleeping pretty well  Would like more hours to sleep   Not currently taking mood med Was on wellbutrin for years -weaned off of it - ? As to whether it worsened night sweats   Prozac gave her diarrhea  Lexapro 20 - worsened focus but 10 was ok    Self care  Exercise -did not over holidays so she started back up - is in a step program  Has a total gym   Struggling with a knee problem on R   Patient Active Problem List   Diagnosis Date Noted   Current use of proton pump inhibitor 12/29/2022   Nausea vomiting and diarrhea 02/12/2022   Epigastric abdominal pain 02/12/2022   Left lower quadrant abdominal pain 02/12/2022   Gastroesophageal reflux disease 06/08/2021   Colon cancer screening 06/08/2021   Breast mass, left 01/30/2021   Elevated BP without diagnosis of hypertension 05/20/2020   Positive ANA (antinuclear antibody) 01/26/2017   Generalized anxiety disorder 10/22/2015   HSV (herpes simplex virus) infection 10/18/2014   Raynaud phenomenon 10/18/2014   Symptoms, such as flushing, sleeplessness,  headache, lack of concentration, associated with the menopause 12/25/2013   Routine general medical examination at a health care facility 04/08/2011   Past Medical History:  Diagnosis Date   Anxiety    on meds   COVID-19    Depression    on meds   Fibroid 01/2009   small   GERD (gastroesophageal reflux disease)    on meds   HSV infection    oral - treated by dentist    Hx: UTI (urinary tract infection)    Irregular menses    Ovarian cyst 01/2009 and 04/2009   Past Surgical History:  Procedure Laterality Date   COLONOSCOPY  2011   DB-F/V-movi(good)-normal   PARTIAL HYSTERECTOMY  2014   TUBAL LIGATION  1991   UPPER GASTROINTESTINAL ENDOSCOPY     WISDOM TOOTH EXTRACTION     Social History   Tobacco Use   Smoking status: Never   Smokeless tobacco: Never  Vaping Use   Vaping Use: Never used  Substance Use Topics   Alcohol use: Yes    Alcohol/week: 4.0 standard drinks of alcohol    Types: 4 Standard drinks or equivalent per week    Comment: occassionally   Drug use: No   Family History  Problem Relation Age of Onset  Hypertension Mother    Breast cancer Mother    Lymphoma Mother    Myasthenia gravis Father    Colon polyps Brother 72   Hypertension Brother    Colon cancer Neg Hx    Esophageal cancer Neg Hx    Stomach cancer Neg Hx    Rectal cancer Neg Hx    Pancreatic cancer Neg Hx    No Known Allergies Current Outpatient Medications on File Prior to Visit  Medication Sig Dispense Refill   albuterol (VENTOLIN HFA) 108 (90 Base) MCG/ACT inhaler Inhale 1-2 puffs into the lungs every 6 (six) hours as needed for wheezing or shortness of breath. 8 g 0   famotidine (PEPCID) 20 MG tablet Take 20 mg by mouth daily as needed.     omeprazole (PRILOSEC) 40 MG capsule Take 1 capsule (40 mg total) by mouth 2 (two) times daily. 60 capsule 5   Current Facility-Administered Medications on File Prior to Visit  Medication Dose Route Frequency Provider Last Rate Last Admin    0.9 %  sodium chloride infusion  500 mL Intravenous Once Nandigam, Venia Minks, MD        Review of Systems  Constitutional:  Positive for fatigue. Negative for activity change, appetite change, fever and unexpected weight change.  HENT:  Negative for congestion, ear pain, rhinorrhea, sinus pressure and sore throat.   Eyes:  Negative for pain, redness and visual disturbance.  Respiratory:  Negative for cough, shortness of breath and wheezing.   Cardiovascular:  Negative for chest pain and palpitations.  Gastrointestinal:  Negative for abdominal pain, blood in stool, constipation and diarrhea.  Endocrine: Negative for polydipsia and polyuria.  Genitourinary:  Negative for dysuria, frequency and urgency.  Musculoskeletal:  Negative for arthralgias, back pain and myalgias.  Skin:  Negative for pallor and rash.  Allergic/Immunologic: Negative for environmental allergies.  Neurological:  Negative for dizziness, syncope and headaches.  Hematological:  Negative for adenopathy. Does not bruise/bleed easily.  Psychiatric/Behavioral:  Positive for decreased concentration and dysphoric mood. Negative for self-injury, sleep disturbance and suicidal ideas. The patient is nervous/anxious.        Objective:   Physical Exam Constitutional:      General: She is not in acute distress.    Appearance: Normal appearance. She is well-developed and normal weight. She is not ill-appearing or diaphoretic.  HENT:     Head: Normocephalic and atraumatic.  Eyes:     Conjunctiva/sclera: Conjunctivae normal.     Pupils: Pupils are equal, round, and reactive to light.  Neck:     Thyroid: No thyromegaly.     Vascular: No carotid bruit or JVD.  Cardiovascular:     Rate and Rhythm: Normal rate and regular rhythm.     Heart sounds: Normal heart sounds.     No gallop.  Pulmonary:     Effort: Pulmonary effort is normal. No respiratory distress.     Breath sounds: Normal breath sounds. No wheezing or rales.   Abdominal:     General: There is no abdominal bruit.  Musculoskeletal:     Cervical back: Normal range of motion and neck supple.     Right lower leg: No edema.     Left lower leg: No edema.  Lymphadenopathy:     Cervical: No cervical adenopathy.  Skin:    General: Skin is warm and dry.     Coloration: Skin is not pale.     Findings: No rash.  Neurological:     Mental Status:  She is alert.     Coordination: Coordination normal.     Deep Tendon Reflexes: Reflexes are normal and symmetric. Reflexes normal.  Psychiatric:        Attention and Perception: Attention normal.        Mood and Affect: Mood normal. Mood is not depressed. Affect is not blunt.        Speech: Speech normal.        Behavior: Behavior normal.        Cognition and Memory: Cognition and memory normal.     Comments: Candidly discusses symptoms and stressors             Assessment & Plan:   Problem List Items Addressed This Visit       Other   Current use of proton pump inhibitor    Had to go up on omep to 40 mg daily   Inst to start B12 1000 mcg daily  Magnesium 250 mg daily if tolerated  Ca and D      Generalized anxiety disorder - Primary    Worsened irritability (moodiness) and concentration  Is on progesterone (? Depot version) for HRT w/o estrogen and sleeps ok  Stress is less overall Reviewed stressors/ coping techniques/symptoms/ support sources/ tx options and side effects in detail today  Feel she would benefit from being back on wellbutrin -will re start XL 150 mg Inst to update if night sweats are bad Disc self care and exercise goals  Good support  Will update 1 mo   In past intol of prozac and lexapro 20- but may consider add on ssri if needed)        Relevant Medications   buPROPion (WELLBUTRIN XL) 150 MG 24 hr tablet

## 2022-12-29 NOTE — Assessment & Plan Note (Signed)
Had to go up on omep to 40 mg daily   Inst to start B12 1000 mcg daily  Magnesium 250 mg daily if tolerated  Ca and D

## 2022-12-29 NOTE — Assessment & Plan Note (Signed)
Worsened irritability (moodiness) and concentration  Is on progesterone (? Depot version) for HRT w/o estrogen and sleeps ok  Stress is less overall Reviewed stressors/ coping techniques/symptoms/ support sources/ tx options and side effects in detail today  Feel she would benefit from being back on wellbutrin -will re start XL 150 mg Inst to update if night sweats are bad Disc self care and exercise goals  Good support  Will update 1 mo   In past intol of prozac and lexapro 20- but may consider add on ssri if needed)

## 2023-01-18 ENCOUNTER — Encounter: Payer: Self-pay | Admitting: Family Medicine

## 2023-01-18 ENCOUNTER — Ambulatory Visit (INDEPENDENT_AMBULATORY_CARE_PROVIDER_SITE_OTHER): Payer: Managed Care, Other (non HMO) | Admitting: Family Medicine

## 2023-01-18 VITALS — BP 136/84 | HR 87 | Temp 97.7°F | Ht 67.0 in | Wt 177.0 lb

## 2023-01-18 DIAGNOSIS — J0100 Acute maxillary sinusitis, unspecified: Secondary | ICD-10-CM | POA: Diagnosis not present

## 2023-01-18 DIAGNOSIS — J019 Acute sinusitis, unspecified: Secondary | ICD-10-CM | POA: Insufficient documentation

## 2023-01-18 MED ORDER — AMOXICILLIN-POT CLAVULANATE 875-125 MG PO TABS: 1.0000 | ORAL_TABLET | Freq: Two times a day (BID) | ORAL | 0 refills | Status: DC

## 2023-01-18 NOTE — Progress Notes (Signed)
Subjective:    Patient ID: Abigail Kramer, female    DOB: 10/23/1966, 57 y.o.   MRN: [id number]  HPI Pt presents with sinus symptoms and cough   Wt Readings from Last 3 Encounters:  01/18/23 177 lb (80.3 kg)  12/29/22 176 lb (79.8 kg)  11/05/22 173 lb 12.8 oz (78.8 kg)   27.72 kg/m  Vitals:   01/18/23 1553  BP: 136/84  Pulse: 87  Temp: 97.7 F (36.5 C)  SpO2: 97%   Uri for 2 weeks  Now some facial pain  R sided - below the eye Also headaches   Cough Worse in the am  Phlegm - gets to throat and cannot get out to see what color  No wheezing  No new sob   Did covid test- was negative  Had fever for 3 days  May have had the flu    Otc: Tylenol sinus (makes her light headed)    Is back on wellbutrin  Was getting light headed Margarito Courser and unsure if that was the cause     Patient Active Problem List   Diagnosis Date Noted   Acute sinusitis 01/18/2023   Current use of proton pump inhibitor 12/29/2022   Nausea vomiting and diarrhea 02/12/2022   Epigastric abdominal pain 02/12/2022   Left lower quadrant abdominal pain 02/12/2022   Gastroesophageal reflux disease 06/08/2021   Colon cancer screening 06/08/2021   Breast mass, left 01/30/2021   Elevated BP without diagnosis of hypertension 05/20/2020   Positive ANA (antinuclear antibody) 01/26/2017   Generalized anxiety disorder 10/22/2015   HSV (herpes simplex virus) infection 10/18/2014   Raynaud phenomenon 10/18/2014   Symptoms, such as flushing, sleeplessness, headache, lack of concentration, associated with the menopause 12/25/2013   Routine general medical examination at a health care facility 04/08/2011   Past Medical History:  Diagnosis Date   Anxiety    on meds   COVID-19    Depression    on meds   Fibroid 01/2009   small   GERD (gastroesophageal reflux disease)    on meds   HSV infection    oral - treated by dentist    Hx: UTI (urinary tract infection)    Irregular menses    Ovarian cyst  01/2009 and 04/2009   Past Surgical History:  Procedure Laterality Date   COLONOSCOPY  2011   DB-F/V-movi(good)-normal   PARTIAL HYSTERECTOMY  2014   TUBAL LIGATION  1991   UPPER GASTROINTESTINAL ENDOSCOPY     WISDOM TOOTH EXTRACTION     Social History   Tobacco Use   Smoking status: Never   Smokeless tobacco: Never  Vaping Use   Vaping Use: Never used  Substance Use Topics   Alcohol use: Yes    Alcohol/week: 4.0 standard drinks of alcohol    Types: 4 Standard drinks or equivalent per week    Comment: occassionally   Drug use: No   Family History  Problem Relation Age of Onset   Hypertension Mother    Breast cancer Mother    Lymphoma Mother    Myasthenia gravis Father    Colon polyps Brother 63   Hypertension Brother    Colon cancer Neg Hx    Esophageal cancer Neg Hx    Stomach cancer Neg Hx    Rectal cancer Neg Hx    Pancreatic cancer Neg Hx    No Known Allergies Current Outpatient Medications on File Prior to Visit  Medication Sig Dispense Refill   albuterol (VENTOLIN HFA)  108 (90 Base) MCG/ACT inhaler Inhale 1-2 puffs into the lungs every 6 (six) hours as needed for wheezing or shortness of breath. 8 g 0   buPROPion (WELLBUTRIN XL) 150 MG 24 hr tablet Take 1 tablet (150 mg total) by mouth daily. 90 tablet 3   famotidine (PEPCID) 20 MG tablet Take 20 mg by mouth daily as needed.     omeprazole (PRILOSEC) 40 MG capsule Take 1 capsule (40 mg total) by mouth 2 (two) times daily. 60 capsule 5   No current facility-administered medications on file prior to visit.    Review of Systems  Constitutional:  Positive for fatigue. Negative for activity change, appetite change, fever and unexpected weight change.  HENT:  Positive for congestion, sinus pressure and sinus pain. Negative for ear pain, rhinorrhea and sore throat.   Eyes:  Negative for pain, redness and visual disturbance.  Respiratory:  Positive for cough. Negative for shortness of breath and wheezing.    Cardiovascular:  Negative for chest pain and palpitations.  Gastrointestinal:  Negative for abdominal pain, blood in stool, constipation and diarrhea.  Endocrine: Negative for polydipsia and polyuria.  Genitourinary:  Negative for dysuria, frequency and urgency.  Musculoskeletal:  Negative for arthralgias, back pain and myalgias.  Skin:  Negative for pallor and rash.  Allergic/Immunologic: Negative for environmental allergies.  Neurological:  Positive for light-headedness. Negative for dizziness, syncope and headaches.  Hematological:  Negative for adenopathy. Does not bruise/bleed easily.  Psychiatric/Behavioral:  Negative for decreased concentration and dysphoric mood. The patient is not nervous/anxious.        Objective:   Physical Exam Constitutional:      General: She is not in acute distress.    Appearance: Normal appearance. She is well-developed and normal weight. She is not ill-appearing.  HENT:     Head: Normocephalic and atraumatic.     Comments: L maxillary sinus tenderness (mild on right)  No facial swelling    Right Ear: Tympanic membrane and external ear normal.     Left Ear: Tympanic membrane and external ear normal.     Nose: Congestion and rhinorrhea present.     Mouth/Throat:     Pharynx: Oropharynx is clear. No oropharyngeal exudate or posterior oropharyngeal erythema.     Comments: Clear pnd Eyes:     General:        Right eye: No discharge.        Left eye: No discharge.     Conjunctiva/sclera: Conjunctivae normal.     Pupils: Pupils are equal, round, and reactive to light.  Cardiovascular:     Rate and Rhythm: Normal rate and regular rhythm.  Pulmonary:     Effort: Pulmonary effort is normal. No respiratory distress.     Breath sounds: Normal breath sounds. No stridor. No wheezing, rhonchi or rales.     Comments: Good air exch No rales or rhonchi Musculoskeletal:     Cervical back: Normal range of motion and neck supple.  Lymphadenopathy:      Cervical: No cervical adenopathy.  Skin:    General: Skin is warm and dry.     Findings: No rash.  Neurological:     Mental Status: She is alert.     Cranial Nerves: No cranial nerve deficit.     Coordination: Coordination normal.  Psychiatric:        Mood and Affect: Mood normal.           Assessment & Plan:   Problem List Items Addressed This  Visit       Respiratory   Acute sinusitis - Primary    2 weeks s/p uri Facial pain and congestion  Disc sympt care -see aVS Px augmentin as directed Update if not starting to improve in a week or if worsening   Consider prednisone only if congestion does not improve      Relevant Medications   amoxicillin-clavulanate (AUGMENTIN) 875-125 MG tablet

## 2023-01-18 NOTE — Assessment & Plan Note (Signed)
2 weeks s/p uri Facial pain and congestion  Disc sympt care -see aVS Px augmentin as directed Update if not starting to improve in a week or if worsening   Consider prednisone only if congestion does not improve

## 2023-01-18 NOTE — Patient Instructions (Addendum)
Drink fluids and rest  Take augmentin as directed for sinus infection  mucinex DM is good for cough and congestion  You can try a nasal steroid spray like flonase or nasacort for congestion - takes about a week to work  Nasal saline for congestion as needed (or netti pot) Breathe steam  Tylenol for fever or pain or headache  Please alert [place] if symptoms worsen (if severe or short of breath please go to the ER)

## 2023-02-21 ENCOUNTER — Other Ambulatory Visit: Payer: Self-pay | Admitting: Physician Assistant

## 2023-02-21 LAB — HM MAMMOGRAPHY

## 2023-02-23 ENCOUNTER — Encounter: Payer: Self-pay | Admitting: Family Medicine

## 2023-03-03 ENCOUNTER — Telehealth: Payer: Self-pay | Admitting: Gastroenterology

## 2023-03-03 NOTE — Telephone Encounter (Signed)
PT was told that she need to have hiatal hernia surgery. She would like to discuss the procedure and what she needs to do to get started. Please advise

## 2023-03-07 NOTE — Telephone Encounter (Signed)
Patient is on Omeprazole 40 mg BID. She does not miss any of her medications. She is feeling the hiatal hernia is " I wake up with a metallic taste in my mouth" "sleeping in a recliner halfway through the night." She is ready to discuss surgical repair and asks what are the steps to take for this to happen.

## 2023-03-09 ENCOUNTER — Encounter: Payer: Self-pay | Admitting: Gastroenterology

## 2023-03-09 NOTE — Telephone Encounter (Signed)
Patient's last EGD 11/05/22 with a recall in 2026 for monitoring of Barrett's. The patient reports her reflux is so bad she is sleeping mostly in her recliner. No recent imaging or swallow studies. Do you want to see her in clinic?

## 2023-03-09 NOTE — Telephone Encounter (Signed)
Patient is calling to follow up with note below , can you please advise . Thanks

## 2023-03-10 ENCOUNTER — Other Ambulatory Visit: Payer: Self-pay

## 2023-03-10 DIAGNOSIS — K2100 Gastro-esophageal reflux disease with esophagitis, without bleeding: Secondary | ICD-10-CM

## 2023-03-10 NOTE — Telephone Encounter (Signed)
Patient contacted. She is being referred to [facility] for consideration of hiatal hernia repair. She has been scheduled for the esophageal manometry on 05/18/23 at 12:30 pm. Patient agrees to this plan of care.

## 2023-03-23 ENCOUNTER — Telehealth: Payer: Self-pay | Admitting: Family Medicine

## 2023-03-23 DIAGNOSIS — Z0000 Encounter for general adult medical examination without abnormal findings: Secondary | ICD-10-CM

## 2023-03-23 DIAGNOSIS — Z79899 Other long term (current) drug therapy: Secondary | ICD-10-CM

## 2023-03-23 NOTE — Telephone Encounter (Signed)
-----   Message from Ellamae Sia sent at 03/14/2023  3:22 PM EDT ----- Regarding: Lab orders for Thursday, 4.4.24 Patient is scheduled for CPX labs, please order future labs, Thanks , Karna Christmas

## 2023-03-24 ENCOUNTER — Other Ambulatory Visit: Payer: Managed Care, Other (non HMO)

## 2023-03-31 ENCOUNTER — Encounter: Payer: Managed Care, Other (non HMO) | Admitting: Family Medicine

## 2023-04-05 ENCOUNTER — Ambulatory Visit: Payer: Self-pay | Admitting: Surgery

## 2023-04-05 NOTE — H&P (Signed)
Abigail Kramer [id number]   Referring Provider:  Montel Culver, MD   Subjective   Chief Complaint: New Consultation ( Hiatal hernia repair)     History of Present Illness:    Very pleasant 57 year old woman with history of anxiety, depression, fibroids, ovarian cyst, and GERD who was referred by Dr. Lavon Paganini for evaluation of a hiatal hernia.  She has suffered with reflux for many years, and recently has had issues with epigastric and right upper quadrant pain and has undergone a fairly extensive workup as listed below.  Currently her symptoms include dysphagia which she describes as a sensation that things stick in the upper chest region; shortness of breath while eating and occasionally awakening her at night, occasional breakthrough reflux despite PPI.  She sleeps at an incline which does seem to help with the nocturnal issues.  She also continues to have intermittent right upper quadrant pain which is very focal.  She states that it is both brought on by eating and when she is about to eat.  In the past she has treated this with over-the-counter antacids with some relief.  The last very severe episode she had came on after eating hamburger which was quite greasy, and this was about a year ago.   The patient had an upper endoscopy on November 05, 2022 that demonstrated short segment Barrett's spanning about 3 cm in length, 1 benign intrinsic mild stenosis that was dilated, a 5 cm hiatal hernia, gastritis.  Biopsies confirm Barrett's without dysplasia, negative for H. pylori.  She is scheduled for esophageal manometry on May 29.  Had a CT of the abdomen pelvis in February 2023 which I reviewed, this does show the hiatal hernia.  Had a right upper quadrant ultrasound in July 2023 which was negative for gallstones although did note a small 4 mm gallbladder polyp.  She also had a HIDA in July 2023 with gallbladder ejection fraction 93%.   previous abdominal surgery includes tubal  ligation in 1991, partial hysterectomy in 2014.    Review of Systems: A complete review of systems was obtained from the patient.  I have reviewed this information and discussed as appropriate with the patient.  See HPI as well for other ROS.   Medical History: Past Medical History:  Diagnosis Date   Anxiety    Arthritis    GERD (gastroesophageal reflux disease)     There is no problem list on file for this patient.   Past Surgical History:  Procedure Laterality Date   LAPAROSCOPIC TUBAL LIGATION  1991   HYSTERECTOMY  2014   CATARACT EXTRACTION  2020   x2   RIGHT EYE SURGERY Right 03/2023   LEFT 02/2023     No Known Allergies  Current Outpatient Medications on File Prior to Visit  Medication Sig Dispense Refill   buPROPion (WELLBUTRIN XL) 150 MG XL tablet Take 150 mg by mouth once daily     estradioL (ESTRACE) 0.01 % (0.1 mg/gram) vaginal cream INSERT 1/2 GRAM VAGINALLY TWICE A WEEK AS NEEDED     omeprazole (PRILOSEC) 40 MG DR capsule Take 40 mg by mouth 2 (two) times daily     No current facility-administered medications on file prior to visit.    History reviewed. No pertinent family history.   Social History   Tobacco Use  Smoking Status Never  Smokeless Tobacco Never     Social History   Socioeconomic History   Marital status: Married  Tobacco Use   Smoking  status: Never   Smokeless tobacco: Never  Substance and Sexual Activity   Alcohol use: Yes   Drug use: Never    Objective:    Vitals:   04/05/23 1016 04/05/23 1017  BP: (!) 154/105   Pulse: 75   Temp: 37 C (98.6 F)   SpO2: 99%   Weight: 77.4 kg (170 lb 9.6 oz)   Height: 170.2 cm ( )   PainSc:  0-No pain  PainLoc:  Abdomen    Body mass index is 26.72 kg/m.  Gen: A&Ox3, no distress  Unlabored respirations  Assessment and Plan:  Diagnoses and all orders for this visit:  Hiatal hernia with gastroesophageal reflux  Barrett's esophagus without dysplasia Comments: EGD  10/2022, Dr. Lavon Paganini  Biliary dyskinesia Comments: EF 93% on HIDA in July 2023    I recommend proceeding with robotic hiatal hernia repair with fundoplication and given elevated gallbladder ejection fraction and right upper quadrant pain associated with meals I think it is reasonable to proceed additionally with concomitant cholecystectomy.  We discussed the relevant anatomy using a diagram to demonstrate and went over the surgical technique.  We discussed risks of bleeding, infection, pain, scarring, injury to intra-abdominal or mediastinal structures, hernia recurrence, slipped or dehisced fundoplication, dysphagia which may be chronic, gas bloat syndrome or prolonged poor gastric emptying, bile leak, bile duct injury, failure to completely resolve symptoms, as well as general cardiovascular, pulmonary, and thromboembolic risks.  Questions welcomed and answered to her satisfaction.  She would like to proceed.  We will plan to do this after her manometry is complete which will give [place] better information about how aggressive we can be with her fundoplication and will help delineate expectations for postoperative esophageal motility/dysphagia issues.  She has a family vacation at the beginning of June so that timing will work out quite well.   Corra Kaine Carlye Grippe, MD

## 2023-04-07 ENCOUNTER — Encounter: Payer: Self-pay | Admitting: Family Medicine

## 2023-04-08 ENCOUNTER — Ambulatory Visit (INDEPENDENT_AMBULATORY_CARE_PROVIDER_SITE_OTHER): Payer: Managed Care, Other (non HMO) | Admitting: Family Medicine

## 2023-04-08 ENCOUNTER — Encounter: Payer: Self-pay | Admitting: Family Medicine

## 2023-04-08 VITALS — BP 118/78 | HR 77 | Temp 97.6°F | Ht 67.0 in | Wt 174.1 lb

## 2023-04-08 DIAGNOSIS — Z79899 Other long term (current) drug therapy: Secondary | ICD-10-CM

## 2023-04-08 DIAGNOSIS — R5382 Chronic fatigue, unspecified: Secondary | ICD-10-CM | POA: Diagnosis not present

## 2023-04-08 DIAGNOSIS — Z13220 Encounter for screening for lipoid disorders: Secondary | ICD-10-CM | POA: Insufficient documentation

## 2023-04-08 DIAGNOSIS — L659 Nonscarring hair loss, unspecified: Secondary | ICD-10-CM

## 2023-04-08 DIAGNOSIS — R1013 Epigastric pain: Secondary | ICD-10-CM

## 2023-04-08 LAB — CBC WITH DIFFERENTIAL/PLATELET
Basophils Absolute: 0 10*3/uL (ref 0.0–0.1)
Basophils Relative: 0.6 % (ref 0.0–3.0)
Eosinophils Absolute: 0.1 10*3/uL (ref 0.0–0.7)
Eosinophils Relative: 0.9 % (ref 0.0–5.0)
HCT: 41.9 % (ref 36.0–46.0)
Hemoglobin: 13.8 g/dL (ref 12.0–15.0)
Lymphocytes Relative: 34.3 % (ref 12.0–46.0)
Lymphs Abs: 2.3 10*3/uL (ref 0.7–4.0)
MCHC: 33 g/dL (ref 30.0–36.0)
MCV: 89.4 fl (ref 78.0–100.0)
Monocytes Absolute: 0.3 10*3/uL (ref 0.1–1.0)
Monocytes Relative: 5.1 % (ref 3.0–12.0)
Neutro Abs: 3.9 10*3/uL (ref 1.4–7.7)
Neutrophils Relative %: 59.1 % (ref 43.0–77.0)
Platelets: 305 10*3/uL (ref 150.0–400.0)
RBC: 4.69 Mil/uL (ref 3.87–5.11)
RDW: 14.3 % (ref 11.5–15.5)
WBC: 6.6 10*3/uL (ref 4.0–10.5)

## 2023-04-08 LAB — COMPREHENSIVE METABOLIC PANEL
ALT: 19 U/L (ref 0–35)
AST: 19 U/L (ref 0–37)
Albumin: 4.7 g/dL (ref 3.5–5.2)
Alkaline Phosphatase: 70 U/L (ref 39–117)
BUN: 15 mg/dL (ref 6–23)
CO2: 29 mEq/L (ref 19–32)
Calcium: 9.5 mg/dL (ref 8.4–10.5)
Chloride: 99 mEq/L (ref 96–112)
Creatinine, Ser: 0.78 mg/dL (ref 0.40–1.20)
GFR: 84.2 mL/min (ref >60.00)
Glucose, Bld: 90 mg/dL (ref 70–99)
Potassium: 4.5 mEq/L (ref 3.5–5.1)
Sodium: 137 mEq/L (ref 135–145)
Total Bilirubin: 0.5 mg/dL (ref 0.2–1.2)
Total Protein: 7.8 g/dL (ref 6.0–8.3)

## 2023-04-08 LAB — LIPID PANEL
Cholesterol: 253 mg/dL — ABNORMAL HIGH (ref 0–200)
HDL: 92.1 mg/dL (ref >39.00)
LDL Cholesterol: 138 mg/dL — ABNORMAL HIGH (ref 0–99)
NonHDL: 160.62
Total CHOL/HDL Ratio: 3
Triglycerides: 111 mg/dL (ref 0.0–149.0)
VLDL: 22.2 mg/dL (ref 0.0–40.0)

## 2023-04-08 LAB — VITAMIN B12: Vitamin B-12: 672 pg/mL (ref 211–911)

## 2023-04-08 LAB — T4, FREE: Free T4: 0.76 ng/dL (ref 0.60–1.60)

## 2023-04-08 LAB — FERRITIN: Ferritin: 13.3 ng/mL (ref 10.0–291.0)

## 2023-04-08 LAB — VITAMIN D 25 HYDROXY (VIT D DEFICIENCY, FRACTURES): VITD: 36.14 ng/mL (ref 30.00–100.00)

## 2023-04-08 LAB — IRON: Iron: 112 ug/dL (ref 42–145)

## 2023-04-08 LAB — TSH: TSH: 2.16 u[IU]/mL (ref 0.35–5.50)

## 2023-04-08 NOTE — Assessment & Plan Note (Signed)
May be multifactorial Also some hair changes Disc hormone/menopause change  Disc thyroid fxn  (thyroid exam seems nl but will watch this)   Lab today

## 2023-04-08 NOTE — Assessment & Plan Note (Addendum)
Omeprazole 30 mg bid   She does suppl mag and B12 and D Levels today  Some hair loss   Hopefully after HH surgery will be able to cut this

## 2023-04-08 NOTE — Assessment & Plan Note (Signed)
Lipid panel today   Good diet overall  Disc low trans/sat fat goals

## 2023-04-08 NOTE — Progress Notes (Signed)
Subjective:    Patient ID: Abigail Kramer, female    DOB: [birth year], 57 y.o.   MRN: [id number]  HPI Pt presents with c/o  Fatigue Hair loss   Wt Readings from Last 3 Encounters:  04/08/23 174 lb 2 oz (79 kg)  01/18/23 177 lb (80.3 kg)  12/29/22 176 lb (79.8 kg)   27.27 kg/m  She lost to 165 lb and now cannot get it down further   No new diet  Exercise is on hold for 2 more weeks    Vitals:   04/08/23 1049  BP: 118/78  Pulse: 77  Temp: 97.6 F (36.4 C)  SpO2: 100%    Has a lot going on   Loss of hair in shower and brush  Texture changes  Some hair growth on face , some on abdomen also    People comment on her neck - looks larger over the last few years  Feels full sometimes  Some trouble swallowing-blames on hiatal hernia so far   Stress level is ok   Has to have HH surgery   Takes ppi   omep 40 bid   No results found for: "IRON", "TIBC", "FERRITIN" No results found for: "VITAMINB12" Last vitamin D No results found for: "25OHVITD2", "25OHVITD3", "VD25OH"   2 eye surgeries in 2 months  Cannot exercise for 2 more weeks   Takes caltrate  Magnesium 250 mg  B12 1000 mcg daily   Eats a balanced diet   Patient Active Problem List   Diagnosis Date Noted   Hair loss 04/08/2023   Lipid screening 04/08/2023   Current use of proton pump inhibitor 12/29/2022   Epigastric abdominal pain 02/12/2022   Left lower quadrant abdominal pain 02/12/2022   Gastroesophageal reflux disease 06/08/2021   Colon cancer screening 06/08/2021   Breast mass, left 01/30/2021   Elevated BP without diagnosis of hypertension 05/20/2020   Positive ANA (antinuclear antibody) 01/26/2017   Generalized anxiety disorder 10/22/2015   Fatigue 10/18/2014   HSV (herpes simplex virus) infection 10/18/2014   Raynaud phenomenon 10/18/2014   Symptoms, such as flushing, sleeplessness, headache, lack of concentration, associated with the menopause 12/25/2013   Routine general medical  examination at a health care facility 04/08/2011   Past Medical History:  Diagnosis Date   Anxiety    on meds   COVID-19    Depression    on meds   Fibroid 01/2009   small   GERD (gastroesophageal reflux disease)    on meds   HSV infection    oral - treated by dentist    Hx: UTI (urinary tract infection)    Irregular menses    Ovarian cyst 01/2009 and 04/2009   Past Surgical History:  Procedure Laterality Date   COLONOSCOPY  2011   DB-F/V-movi(good)-normal   PARTIAL HYSTERECTOMY  2014   TUBAL LIGATION  1991   UPPER GASTROINTESTINAL ENDOSCOPY     WISDOM TOOTH EXTRACTION     Social History   Tobacco Use   Smoking status: Never   Smokeless tobacco: Never  Vaping Use   Vaping Use: Never used  Substance Use Topics   Alcohol use: Yes    Alcohol/week: 4.0 standard drinks of alcohol    Types: 4 Standard drinks or equivalent per week    Comment: occassionally   Drug use: No   Family History  Problem Relation Age of Onset   Hypertension Mother    Breast cancer Mother    Lymphoma Mother  Myasthenia gravis Father    Colon polyps Brother 39   Hypertension Brother    Colon cancer Neg Hx    Esophageal cancer Neg Hx    Stomach cancer Neg Hx    Rectal cancer Neg Hx    Pancreatic cancer Neg Hx    No Known Allergies Current Outpatient Medications on File Prior to Visit  Medication Sig Dispense Refill   albuterol (VENTOLIN HFA) 108 (90 Base) MCG/ACT inhaler Inhale 1-2 puffs into the lungs every 6 (six) hours as needed for wheezing or shortness of breath. 8 g 0   buPROPion (WELLBUTRIN XL) 150 MG 24 hr tablet Take 1 tablet (150 mg total) by mouth daily. 90 tablet 3   calcium carbonate (OSCAL) 1500 (600 Ca) MG TABS tablet Take 1,500 mg by mouth daily with breakfast.     cyanocobalamin (VITAMIN B12) 1000 MCG tablet Take 1,000 mcg by mouth daily.     Magnesium 250 MG TABS Take 250 mg by mouth daily.     omeprazole (PRILOSEC) 40 MG capsule TAKE 1 CAPSULE BY MOUTH TWICE A DAY 60  capsule 3   No current facility-administered medications on file prior to visit.     Review of Systems  Constitutional:  Negative for activity change, appetite change, fatigue, fever and unexpected weight change.  HENT:  Negative for congestion, ear pain, rhinorrhea, sinus pressure and sore throat.   Eyes:  Negative for pain, redness and visual disturbance.  Respiratory:  Negative for cough, shortness of breath and wheezing.   Cardiovascular:  Negative for chest pain and palpitations.  Gastrointestinal:  Negative for abdominal pain, blood in stool, constipation and diarrhea.  Endocrine: Negative for polydipsia and polyuria.  Genitourinary:  Negative for dysuria, frequency and urgency.  Musculoskeletal:  Negative for arthralgias, back pain and myalgias.  Skin:  Negative for pallor and rash.       Hair loss and changes   Allergic/Immunologic: Negative for environmental allergies.  Neurological:  Negative for dizziness, syncope and headaches.  Hematological:  Negative for adenopathy. Does not bruise/bleed easily.  Psychiatric/Behavioral:  Negative for decreased concentration and dysphoric mood. The patient is not nervous/anxious.        Objective:   Physical Exam Constitutional:      General: She is not in acute distress.    Appearance: Normal appearance. She is well-developed and normal weight. She is not ill-appearing or diaphoretic.  HENT:     Head: Normocephalic and atraumatic.  Eyes:     Conjunctiva/sclera: Conjunctivae normal.     Pupils: Pupils are equal, round, and reactive to light.  Neck:     Thyroid: No thyromegaly.     Vascular: No carotid bruit or JVD.  Cardiovascular:     Rate and Rhythm: Normal rate and regular rhythm.     Heart sounds: Normal heart sounds.     No gallop.  Pulmonary:     Effort: Pulmonary effort is normal. No respiratory distress.     Breath sounds: Normal breath sounds. No wheezing or rales.  Abdominal:     General: There is no distension or  abdominal bruit.     Palpations: Abdomen is soft.  Musculoskeletal:     Cervical back: Normal range of motion and neck supple.     Right lower leg: No edema.     Left lower leg: No edema.  Lymphadenopathy:     Cervical: No cervical adenopathy.  Skin:    General: Skin is warm and dry.     Coloration:  Skin is not pale.     Findings: No rash.     Comments: Solar lentigines diffusely  No focal alopecia    Neurological:     Mental Status: She is alert.     Cranial Nerves: No cranial nerve deficit.     Coordination: Coordination normal.     Deep Tendon Reflexes: Reflexes are normal and symmetric. Reflexes normal.  Psychiatric:        Mood and Affect: Mood normal.           Assessment & Plan:   Problem List Items Addressed This Visit       Other   Current use of proton pump inhibitor    Omeprazole 30 mg bid   She does suppl mag and B12 and D Levels today  Some hair loss   Hopefully after HH surgery will be able to cut this       Relevant Orders   VITAMIN D 25 Hydroxy (Vit-D Deficiency, Fractures)   Vitamin B12   Iron   Ferritin   Epigastric abdominal pain    Reviewed GI notes Planning HH surgery/fundiplication   If no imp in this or swallowing inst to follow up      Fatigue    May be multifactorial Also some hair changes Disc hormone/menopause change  Disc thyroid fxn  (thyroid exam seems nl but will watch this)   Lab today         Relevant Orders   TSH   Comprehensive metabolic panel   CBC with Differential/Platelet   VITAMIN D 25 Hydroxy (Vit-D Deficiency, Fractures)   Vitamin B12   Iron   Ferritin   T4, free   Hair loss - Primary    All over-no focal alopecia  Some age changes and poss early hirsuute features also  Poss hormonal   Lab today for thyroid/ iron/ vitamins as well   Consider derm visit if needed        Relevant Orders   TSH   Comprehensive metabolic panel   CBC with Differential/Platelet   VITAMIN D 25 Hydroxy (Vit-D  Deficiency, Fractures)   Vitamin B12   Iron   Ferritin   T4, free   Lipid screening    Lipid panel today   Good diet overall  Disc low trans/sat fat goals      Relevant Orders   Lipid panel

## 2023-04-08 NOTE — Assessment & Plan Note (Signed)
Reviewed GI notes Planning HH surgery/fundiplication   If no imp in this or swallowing inst to follow up

## 2023-04-08 NOTE — Assessment & Plan Note (Signed)
All over-no focal alopecia  Some age changes and poss early hirsuute features also  Poss hormonal   Lab today for thyroid/ iron/ vitamins as well   Consider derm visit if needed

## 2023-04-08 NOTE — Patient Instructions (Signed)
Labs today for hair loss and fatigue  Also cholesterol screen   Get back to exercise when you are released to   Take care of yourself  Keep up fluids also

## 2023-05-18 ENCOUNTER — Ambulatory Visit (HOSPITAL_COMMUNITY)
Admission: RE | Admit: 2023-05-18 | Discharge: 2023-05-18 | Disposition: A | Discharge status: Home / Self Care | Payer: Managed Care, Other (non HMO) | Attending: Gastroenterology | Admitting: Gastroenterology

## 2023-05-18 ENCOUNTER — Encounter (HOSPITAL_COMMUNITY)
Admission: RE | Disposition: A | Discharge status: Home / Self Care | Payer: Self-pay | Source: Home / Self Care | Attending: Gastroenterology

## 2023-05-18 DIAGNOSIS — K449 Diaphragmatic hernia without obstruction or gangrene: Secondary | ICD-10-CM | POA: Diagnosis not present

## 2023-05-18 DIAGNOSIS — K219 Gastro-esophageal reflux disease without esophagitis: Secondary | ICD-10-CM | POA: Diagnosis not present

## 2023-05-18 HISTORY — PX: ESOPHAGEAL MANOMETRY: SHX5429

## 2023-05-18 SURGERY — MANOMETRY, ESOPHAGUS
Anesthesia: Choice

## 2023-05-18 MED ORDER — LIDOCAINE VISCOUS HCL 2 % MT SOLN
OROMUCOSAL | Status: AC
  Filled 2023-05-18: qty 15

## 2023-05-18 SURGICAL SUPPLY — 2 items
FACESHIELD LNG OPTICON STERILE (SAFETY) IMPLANT
GLOVE BIO SURGEON STRL SZ8 (GLOVE) ×4 IMPLANT

## 2023-05-18 NOTE — Progress Notes (Signed)
Esophageal Manometry done per protocol. Patient tolerated well without distress or complication.   

## 2023-05-22 ENCOUNTER — Encounter (HOSPITAL_COMMUNITY): Payer: Self-pay | Admitting: Gastroenterology

## 2023-05-23 NOTE — Patient Instructions (Signed)
SURGICAL WAITING ROOM VISITATION  Patients having surgery or a procedure may have no more than 2 support people in the waiting area - these visitors may rotate.    Children under the age of 38 must have an adult with them who is not the patient.  Due to an increase in RSV and influenza rates and associated hospitalizations, children ages 34 and under may not visit patients in [facility] hospitals.  If the patient needs to stay at the hospital during part of their recovery, the visitor guidelines for inpatient rooms apply. Pre-op nurse will coordinate an appropriate time for 1 support person to accompany patient in pre-op.  This support person may not rotate.    Please refer to the [facility] website for the visitor guidelines for Inpatients (after your surgery is over and you are in a regular room).    Your procedure is scheduled on: 06/13/23   Report to [facility] Main Entrance    Report to admitting at 5:15 AM   Call this number if you have problems the morning of surgery [phone]   Do not eat food or drink liquids :After Midnight.          If you have questions, please contact your surgeon's office.   FOLLOW BOWEL PREP AND ANY ADDITIONAL PRE OP INSTRUCTIONS YOU RECEIVED FROM YOUR SURGEON'S OFFICE!!!     Oral Hygiene is also important to reduce your risk of infection.                                    Remember - BRUSH YOUR TEETH THE MORNING OF SURGERY WITH YOUR REGULAR TOOTHPASTE  DENTURES WILL BE REMOVED PRIOR TO SURGERY PLEASE DO NOT APPLY "Poly grip" OR ADHESIVES!!!   Do NOT smoke after Midnight   Take these medicines the morning of surgery with A SIP OF WATER: Albuterol, Bupropion, Omeprazole   DO NOT TAKE ANY ORAL DIABETIC MEDICATIONS DAY OF YOUR SURGERY  Bring CPAP mask and tubing day of surgery.                              You may not have any metal on your body including hair pins, jewelry, and body piercing             Do not wear make-up,  lotions, powders, perfumes, or deodorant  Do not wear nail polish including gel and S&S, artificial/acrylic nails, or any other type of covering on natural nails including finger and toenails. If you have artificial nails, gel coating, etc. that needs to be removed by a nail salon please have this removed prior to surgery or surgery may need to be canceled/ delayed if the surgeon/ anesthesia feels like they are unable to be safely monitored.   Do not shave  48 hours prior to surgery.   Do not bring valuables to the hospital. [place] IS NOT             RESPONSIBLE   FOR VALUABLES.   Contacts, glasses, dentures or bridgework may not be worn into surgery.   Bring small overnight bag day of surgery.   DO NOT BRING YOUR HOME MEDICATIONS TO THE HOSPITAL. PHARMACY WILL DISPENSE MEDICATIONS LISTED ON YOUR MEDICATION LIST TO YOU DURING YOUR ADMISSION IN THE HOSPITAL!   Special Instructions: Bring a copy of your healthcare power of attorney and  living will documents the day of surgery if you haven't scanned them before.              Please read over the following fact sheets you were given: IF YOU HAVE QUESTIONS ABOUT YOUR PRE-OP INSTRUCTIONS PLEASE CALL [phone] Contras    If you received a COVID test during your pre-op visit  it is requested that you wear a mask when out in public, stay away from anyone that may not be feeling well and notify your surgeon if you develop symptoms. If you test positive for Covid or have been in contact with anyone that has tested positive in the last 10 days please notify you surgeon.    [place] - Preparing for Surgery Before surgery, you can play an important role.  Because skin is not sterile, your skin needs to be as free of germs as possible.  You can reduce the number of germs on your skin by washing with CHG (chlorahexidine gluconate) soap before surgery.  CHG is an antiseptic cleaner which kills germs and bonds with the skin to continue killing germs  even after washing. Please DO NOT use if you have an allergy to CHG or antibacterial soaps.  If your skin becomes reddened/irritated stop using the CHG and inform your nurse when you arrive at Short Stay. Do not shave (including legs and underarms) for at least 48 hours prior to the first CHG shower.  You may shave your face/neck.  Please follow these instructions carefully:  1.  Shower with CHG Soap the night before surgery and the  morning of surgery.  2.  If you choose to wash your hair, wash your hair first as usual with your normal  shampoo.  3.  After you shampoo, rinse your hair and body thoroughly to remove the shampoo.                             4.  Use CHG as you would any other liquid soap.  You can apply chg directly to the skin and wash.  Gently with a scrungie or clean washcloth.  5.  Apply the CHG Soap to your body ONLY FROM THE NECK DOWN.   Do   not use on face/ open                           Wound or open sores. Avoid contact with eyes, ears mouth and   genitals (private parts).                       Wash face,  Genitals (private parts) with your normal soap.             6.  Wash thoroughly, paying special attention to the area where your    surgery  will be performed.  7.  Thoroughly rinse your body with warm water from the neck down.  8.  DO NOT shower/wash with your normal soap after using and rinsing off the CHG Soap.                9.  Pat yourself dry with a clean towel.            10.  Wear clean pajamas.            11.  Place clean sheets on your bed the night of your first shower and do  not  sleep with pets. Day of Surgery : Do not apply any lotions/deodorants the morning of surgery.  Please wear clean clothes to the hospital/surgery center.  FAILURE TO FOLLOW THESE INSTRUCTIONS MAY RESULT IN THE CANCELLATION OF YOUR SURGERY  PATIENT SIGNATURE_________________________________  NURSE  SIGNATURE__________________________________  ________________________________________________________________________

## 2023-05-23 NOTE — Progress Notes (Signed)
COVID Vaccine Completed: yes  Date of COVID positive in last 90 days:  PCP - Roxy Manns, MD Cardiologist - brian Agbor-Etang, MD LOV 03/18/20 for CP and SOB  Chest x-ray -  EKG -  Stress Test -  ECHO - 01/29/20 Epic Cardiac Cath -  Pacemaker/ICD device last checked: Spinal Cord Stimulator:  Bowel Prep -   Sleep Study -  CPAP -   Fasting Blood Sugar -  Checks Blood Sugar _____ times a day  Last dose of GLP1 agonist-  N/A GLP1 instructions:  N/A   Last dose of SGLT-2 inhibitors-  N/A SGLT-2 instructions: N/A   Blood Thinner Instructions:  Time Aspirin Instructions: Last Dose:  Activity level:  Can go up a flight of stairs and perform activities of daily living without stopping and without symptoms of chest pain or shortness of breath.  Able to exercise without symptoms  Unable to go up a flight of stairs without symptoms of     Anesthesia review:   Patient denies shortness of breath, fever, cough and chest pain at PAT appointment  Patient verbalized understanding of instructions that were given to them at the PAT appointment. Patient was also instructed that they will need to review over the PAT instructions again at home before surgery.

## 2023-05-24 ENCOUNTER — Encounter (HOSPITAL_COMMUNITY): Payer: Self-pay

## 2023-05-24 ENCOUNTER — Encounter (HOSPITAL_COMMUNITY)
Admission: RE | Admit: 2023-05-24 | Discharge: 2023-05-24 | Disposition: A | Discharge status: Home / Self Care | Payer: Managed Care, Other (non HMO) | Source: Ambulatory Visit | Attending: Surgery | Admitting: Surgery

## 2023-05-24 ENCOUNTER — Other Ambulatory Visit: Payer: Self-pay

## 2023-05-24 DIAGNOSIS — Z01818 Encounter for other preprocedural examination: Secondary | ICD-10-CM | POA: Diagnosis present

## 2023-05-24 HISTORY — DX: Hypothyroidism, unspecified: E03.9

## 2023-05-24 HISTORY — DX: Family history of other specified conditions: Z84.89

## 2023-05-24 HISTORY — DX: Barrett's esophagus without dysplasia: K22.70

## 2023-05-24 LAB — CBC
HCT: 40.5 % (ref 36.0–46.0)
Hemoglobin: 13.1 g/dL (ref 12.0–15.0)
MCH: 29.5 pg (ref 26.0–34.0)
MCHC: 32.3 g/dL (ref 30.0–36.0)
MCV: 91.2 fL (ref 80.0–100.0)
Platelets: 321 10*3/uL (ref 150–400)
RBC: 4.44 MIL/uL (ref 3.87–5.11)
RDW: 13.3 % (ref 11.5–15.5)
WBC: 8.7 10*3/uL (ref 4.0–10.5)
nRBC: 0 % (ref 0.0–0.2)

## 2023-05-24 NOTE — Progress Notes (Signed)
COVID Vaccine Completed: yes   Date of COVID positive in last 90 days: no   PCP - Roxy Manns, MD Cardiologist - brian Agbor-Etang, MD LOV 03/18/20 for CP and SOB   Chest x-ray - n/a EKG - n/a Stress Test - n/a ECHO - 01/29/20 Epic Cardiac Cath - n/a Pacemaker/ICD device last checked: n/a Spinal Cord Stimulator: n/a   Bowel Prep - no   Sleep Study - n/a CPAP -    Fasting Blood Sugar - n/a Checks Blood Sugar _____ times a day   Last dose of GLP1 agonist-  N/A GLP1 instructions:  N/A   Last dose of SGLT-2 inhibitors-  N/A SGLT-2 instructions: N/A     Blood Thinner Instructions:  n/a Aspirin Instructions: Last Dose:   Activity level:   Can go up a flight of stairs and perform activities of daily living without stopping and without symptoms of chest pain or shortness of breath.                       Anesthesia review:    Patient denies shortness of breath, fever, cough and chest pain at PAT appointment   Patient verbalized understanding of instructions that were given to them at the PAT appointment. Patient was also instructed that they will need to review over the PAT instructions again at home before surgery.

## 2023-05-24 NOTE — Patient Instructions (Signed)
SURGICAL WAITING ROOM VISITATION  Patients having surgery or a procedure may have no more than 2 support people in the waiting area - these visitors may rotate.    Children under the age of 19 must have an adult with them who is not the patient.  Due to an increase in RSV and influenza rates and associated hospitalizations, children ages 33 and under may not visit patients in [facility] hospitals.  If the patient needs to stay at the hospital during part of their recovery, the visitor guidelines for inpatient rooms apply. Pre-op nurse will coordinate an appropriate time for 1 support person to accompany patient in pre-op.  This support person may not rotate.    Please refer to the [facility] website for the visitor guidelines for Inpatients (after your surgery is over and you are in a regular room).    Your procedure is scheduled on: 06/13/23   Report to [facility] Main Entrance    Report to admitting at 5:15 AM   Call this number if you have problems the morning of surgery [phone]   Do not eat food or drink liquids :After Midnight.          If you have questions, please contact your surgeon's office.   FOLLOW BOWEL PREP AND ANY ADDITIONAL PRE OP INSTRUCTIONS YOU RECEIVED FROM YOUR SURGEON'S OFFICE!!!     Oral Hygiene is also important to reduce your risk of infection.                                    Remember - BRUSH YOUR TEETH THE MORNING OF SURGERY WITH YOUR REGULAR TOOTHPASTE  DENTURES WILL BE REMOVED PRIOR TO SURGERY PLEASE DO NOT APPLY "Poly grip" OR ADHESIVES!!!   Take these medicines the morning of surgery with A SIP OF WATER: Albuterol, Bupropion, Omeprazole, Thyroid NP                              You may not have any metal on your body including hair pins, jewelry, and body piercing             Do not wear make-up, lotions, powders, perfumes, or deodorant  Do not wear nail polish including gel and S&S, artificial/acrylic nails, or any other type of  covering on natural nails including finger and toenails. If you have artificial nails, gel coating, etc. that needs to be removed by a nail salon please have this removed prior to surgery or surgery may need to be canceled/ delayed if the surgeon/ anesthesia feels like they are unable to be safely monitored.   Do not shave  48 hours prior to surgery.    Do not bring valuables to the hospital. [place] IS NOT             RESPONSIBLE   FOR VALUABLES.   Contacts, glasses, dentures or bridgework may not be worn into surgery.   Bring small overnight bag day of surgery.   DO NOT BRING YOUR HOME MEDICATIONS TO THE HOSPITAL. PHARMACY WILL DISPENSE MEDICATIONS LISTED ON YOUR MEDICATION LIST TO YOU DURING YOUR ADMISSION IN THE HOSPITAL!    Special Instructions: Bring a copy of your healthcare power of attorney and living will documents the day of surgery if you haven't scanned them before.  Please read over the following fact sheets you were given: IF YOU HAVE QUESTIONS ABOUT YOUR PRE-OP INSTRUCTIONS PLEASE CALL [phone] Contras   If you received a COVID test during your pre-op visit  it is requested that you wear a mask when out in public, stay away from anyone that may not be feeling well and notify your surgeon if you develop symptoms. If you test positive for Covid or have been in contact with anyone that has tested positive in the last 10 days please notify you surgeon.    [place] - Preparing for Surgery Before surgery, you can play an important role.  Because skin is not sterile, your skin needs to be as free of germs as possible.  You can reduce the number of germs on your skin by washing with CHG (chlorahexidine gluconate) soap before surgery.  CHG is an antiseptic cleaner which kills germs and bonds with the skin to continue killing germs even after washing. Please DO NOT use if you have an allergy to CHG or antibacterial soaps.  If your skin becomes reddened/irritated  stop using the CHG and inform your nurse when you arrive at Short Stay. Do not shave (including legs and underarms) for at least 48 hours prior to the first CHG shower.  You may shave your face/neck.  Please follow these instructions carefully:  1.  Shower with CHG Soap the night before surgery and the  morning of surgery.  2.  If you choose to wash your hair, wash your hair first as usual with your normal  shampoo.  3.  After you shampoo, rinse your hair and body thoroughly to remove the shampoo.                             4.  Use CHG as you would any other liquid soap.  You can apply chg directly to the skin and wash.  Gently with a scrungie or clean washcloth.  5.  Apply the CHG Soap to your body ONLY FROM THE NECK DOWN.   Do   not use on face/ open                           Wound or open sores. Avoid contact with eyes, ears mouth and   genitals (private parts).                       Wash face,  Genitals (private parts) with your normal soap.             6.  Wash thoroughly, paying special attention to the area where your    surgery  will be performed.  7.  Thoroughly rinse your body with warm water from the neck down.  8.  DO NOT shower/wash with your normal soap after using and rinsing off the CHG Soap.                9.  Pat yourself dry with a clean towel.            10.  Wear clean pajamas.            11.  Place clean sheets on your bed the night of your first shower and do not  sleep with pets. Day of Surgery : Do not apply any lotions/deodorants the morning of surgery.  Please wear clean clothes to the hospital/surgery center.  FAILURE TO FOLLOW THESE INSTRUCTIONS MAY RESULT IN THE CANCELLATION OF YOUR SURGERY  PATIENT SIGNATURE_________________________________  NURSE SIGNATURE__________________________________  ________________________________________________________________________

## 2023-06-13 ENCOUNTER — Inpatient Hospital Stay (HOSPITAL_COMMUNITY)
Admission: AD | Admit: 2023-06-13 | Discharge: 2023-06-15 | DRG: 328 | Disposition: A | Discharge status: Home / Self Care | Payer: Managed Care, Other (non HMO) | Attending: Surgery | Admitting: Surgery

## 2023-06-13 ENCOUNTER — Other Ambulatory Visit: Payer: Self-pay

## 2023-06-13 ENCOUNTER — Encounter (HOSPITAL_COMMUNITY)
Admission: AD | Disposition: A | Discharge status: Home / Self Care | Payer: Self-pay | Source: Home / Self Care | Attending: Surgery

## 2023-06-13 ENCOUNTER — Ambulatory Visit (HOSPITAL_COMMUNITY): Payer: Managed Care, Other (non HMO) | Admitting: Certified Registered Nurse Anesthetist

## 2023-06-13 ENCOUNTER — Ambulatory Visit (HOSPITAL_BASED_OUTPATIENT_CLINIC_OR_DEPARTMENT_OTHER): Payer: Managed Care, Other (non HMO) | Admitting: Certified Registered Nurse Anesthetist

## 2023-06-13 ENCOUNTER — Encounter (HOSPITAL_COMMUNITY): Payer: Self-pay | Admitting: Surgery

## 2023-06-13 DIAGNOSIS — K449 Diaphragmatic hernia without obstruction or gangrene: Secondary | ICD-10-CM

## 2023-06-13 DIAGNOSIS — Z79899 Other long term (current) drug therapy: Secondary | ICD-10-CM

## 2023-06-13 DIAGNOSIS — K828 Other specified diseases of gallbladder: Secondary | ICD-10-CM | POA: Diagnosis not present

## 2023-06-13 DIAGNOSIS — Z8719 Personal history of other diseases of the digestive system: Secondary | ICD-10-CM

## 2023-06-13 DIAGNOSIS — K219 Gastro-esophageal reflux disease without esophagitis: Secondary | ICD-10-CM | POA: Diagnosis present

## 2023-06-13 DIAGNOSIS — K2270 Barrett's esophagus without dysplasia: Secondary | ICD-10-CM

## 2023-06-13 DIAGNOSIS — Z90711 Acquired absence of uterus with remaining cervical stump: Secondary | ICD-10-CM

## 2023-06-13 DIAGNOSIS — K811 Chronic cholecystitis: Secondary | ICD-10-CM | POA: Diagnosis not present

## 2023-06-13 DIAGNOSIS — E039 Hypothyroidism, unspecified: Secondary | ICD-10-CM

## 2023-06-13 DIAGNOSIS — F32A Depression, unspecified: Secondary | ICD-10-CM | POA: Diagnosis present

## 2023-06-13 DIAGNOSIS — K824 Cholesterolosis of gallbladder: Secondary | ICD-10-CM | POA: Diagnosis present

## 2023-06-13 DIAGNOSIS — F419 Anxiety disorder, unspecified: Secondary | ICD-10-CM | POA: Diagnosis present

## 2023-06-13 DIAGNOSIS — Z01818 Encounter for other preprocedural examination: Secondary | ICD-10-CM

## 2023-06-13 DIAGNOSIS — Z7952 Long term (current) use of systemic steroids: Secondary | ICD-10-CM

## 2023-06-13 DIAGNOSIS — Z9851 Tubal ligation status: Secondary | ICD-10-CM

## 2023-06-13 HISTORY — PX: XI ROBOTIC ASSISTED HIATAL HERNIA REPAIR: SHX6889

## 2023-06-13 SURGERY — REPAIR, HERNIA, HIATAL, ROBOT-ASSISTED
Anesthesia: General | Site: Abdomen

## 2023-06-13 MED ORDER — MIDAZOLAM HCL 5 MG/5ML IJ SOLN
INTRAMUSCULAR | Status: DC | PRN
  Administered 2023-06-13: 2 mg via INTRAVENOUS

## 2023-06-13 MED ORDER — ONDANSETRON HCL 4 MG/2ML IJ SOLN
4.0000 mg | Freq: Four times a day (QID) | INTRAMUSCULAR | Status: DC | PRN
  Administered 2023-06-14: 4 mg via INTRAVENOUS
  Filled 2023-06-13: qty 2

## 2023-06-13 MED ORDER — BUPROPION HCL ER (XL) 150 MG PO TB24
150.0000 mg | ORAL_TABLET | Freq: Every day | ORAL | Status: DC
  Administered 2023-06-14 – 2023-06-15 (×2): 150 mg via ORAL
  Filled 2023-06-13 (×2): qty 1

## 2023-06-13 MED ORDER — BUPIVACAINE-EPINEPHRINE 0.25% -1:200000 IJ SOLN
INTRAMUSCULAR | Status: AC
  Filled 2023-06-13: qty 1

## 2023-06-13 MED ORDER — LACTATED RINGERS IR SOLN
Status: DC | PRN
  Administered 2023-06-13: 1000 mL

## 2023-06-13 MED ORDER — ACETAMINOPHEN 500 MG PO TABS
1000.0000 mg | ORAL_TABLET | Freq: Four times a day (QID) | ORAL | Status: DC
  Administered 2023-06-13 – 2023-06-15 (×9): 1000 mg via ORAL
  Filled 2023-06-13 (×9): qty 2

## 2023-06-13 MED ORDER — HYDROMORPHONE HCL 1 MG/ML IJ SOLN: 0.5000 mg | INTRAMUSCULAR | Status: DC | PRN

## 2023-06-13 MED ORDER — CHLORHEXIDINE GLUCONATE 4 % EX SOLN: 60.0000 mL | Freq: Once | CUTANEOUS | Status: DC

## 2023-06-13 MED ORDER — DEXMEDETOMIDINE HCL IN NACL 80 MCG/20ML IV SOLN
INTRAVENOUS | Status: DC | PRN
  Administered 2023-06-13: 8 ug via INTRAVENOUS

## 2023-06-13 MED ORDER — TRAMADOL HCL 50 MG PO TABS
50.0000 mg | ORAL_TABLET | Freq: Four times a day (QID) | ORAL | Status: DC | PRN
  Filled 2023-06-13: qty 1

## 2023-06-13 MED ORDER — ORAL CARE MOUTH RINSE: 15.0000 mL | Freq: Once | OROMUCOSAL | Status: AC

## 2023-06-13 MED ORDER — THYROID 30 MG PO TABS
15.0000 mg | ORAL_TABLET | Freq: Every day | ORAL | Status: DC
  Administered 2023-06-14 – 2023-06-15 (×2): 15 mg via ORAL
  Filled 2023-06-13 (×2): qty 1

## 2023-06-13 MED ORDER — ROCURONIUM BROMIDE 10 MG/ML (PF) SYRINGE
PREFILLED_SYRINGE | INTRAVENOUS | Status: DC | PRN
  Administered 2023-06-13: 20 mg via INTRAVENOUS
  Administered 2023-06-13: 50 mg via INTRAVENOUS
  Administered 2023-06-13: 10 mg via INTRAVENOUS

## 2023-06-13 MED ORDER — FENTANYL CITRATE (PF) 100 MCG/2ML IJ SOLN
INTRAMUSCULAR | Status: DC | PRN
  Administered 2023-06-13: 100 ug via INTRAVENOUS

## 2023-06-13 MED ORDER — LIDOCAINE HCL (PF) 2 % IJ SOLN
INTRAMUSCULAR | Status: DC | PRN
  Administered 2023-06-13: 1 mg/kg/h via INTRADERMAL

## 2023-06-13 MED ORDER — OXYCODONE HCL 5 MG PO TABS: 5.0000 mg | ORAL_TABLET | Freq: Four times a day (QID) | ORAL | Status: DC | PRN

## 2023-06-13 MED ORDER — FENTANYL CITRATE PF 50 MCG/ML IJ SOSY
PREFILLED_SYRINGE | INTRAMUSCULAR | Status: AC
  Filled 2023-06-13: qty 1

## 2023-06-13 MED ORDER — DIPHENHYDRAMINE HCL 50 MG/ML IJ SOLN: 25.0000 mg | Freq: Four times a day (QID) | INTRAMUSCULAR | Status: DC | PRN

## 2023-06-13 MED ORDER — BISACODYL 10 MG RE SUPP: 10.0000 mg | Freq: Every day | RECTAL | Status: DC | PRN

## 2023-06-13 MED ORDER — LIDOCAINE HCL (PF) 2 % IJ SOLN
INTRAMUSCULAR | Status: AC
  Filled 2023-06-13: qty 15

## 2023-06-13 MED ORDER — METOCLOPRAMIDE HCL 5 MG/ML IJ SOLN
10.0000 mg | Freq: Four times a day (QID) | INTRAMUSCULAR | Status: DC
  Administered 2023-06-13 – 2023-06-15 (×8): 10 mg via INTRAVENOUS
  Filled 2023-06-13 (×8): qty 2

## 2023-06-13 MED ORDER — METOPROLOL TARTRATE 5 MG/5ML IV SOLN: 5.0000 mg | Freq: Four times a day (QID) | INTRAVENOUS | Status: DC | PRN

## 2023-06-13 MED ORDER — SODIUM CHLORIDE 0.9 % IV SOLN: INTRAVENOUS | Status: DC

## 2023-06-13 MED ORDER — AMISULPRIDE (ANTIEMETIC) 5 MG/2ML IV SOLN
INTRAVENOUS | Status: AC
  Filled 2023-06-13: qty 2

## 2023-06-13 MED ORDER — PHENYLEPHRINE 80 MCG/ML (10ML) SYRINGE FOR IV PUSH (FOR BLOOD PRESSURE SUPPORT)
PREFILLED_SYRINGE | INTRAVENOUS | Status: DC | PRN
  Administered 2023-06-13: 80 ug via INTRAVENOUS

## 2023-06-13 MED ORDER — GABAPENTIN 300 MG PO CAPS
300.0000 mg | ORAL_CAPSULE | ORAL | Status: AC
  Administered 2023-06-13: 300 mg via ORAL
  Filled 2023-06-13: qty 1

## 2023-06-13 MED ORDER — BUPIVACAINE LIPOSOME 1.3 % IJ SUSP: 20.0000 mL | Freq: Once | INTRAMUSCULAR | Status: DC

## 2023-06-13 MED ORDER — DEXAMETHASONE SODIUM PHOSPHATE 10 MG/ML IJ SOLN
INTRAMUSCULAR | Status: DC | PRN
  Administered 2023-06-13: 10 mg via INTRAVENOUS

## 2023-06-13 MED ORDER — FENTANYL CITRATE PF 50 MCG/ML IJ SOSY
25.0000 ug | PREFILLED_SYRINGE | INTRAMUSCULAR | Status: DC | PRN
  Administered 2023-06-13: 50 ug via INTRAVENOUS

## 2023-06-13 MED ORDER — GABAPENTIN 100 MG PO CAPS
300.0000 mg | ORAL_CAPSULE | Freq: Two times a day (BID) | ORAL | Status: DC
  Administered 2023-06-13 – 2023-06-15 (×5): 300 mg via ORAL
  Filled 2023-06-13 (×5): qty 3

## 2023-06-13 MED ORDER — ENOXAPARIN SODIUM 40 MG/0.4ML IJ SOSY
40.0000 mg | PREFILLED_SYRINGE | INTRAMUSCULAR | Status: DC
  Filled 2023-06-13: qty 0.4

## 2023-06-13 MED ORDER — DOCUSATE SODIUM 100 MG PO CAPS
100.0000 mg | ORAL_CAPSULE | Freq: Two times a day (BID) | ORAL | Status: DC
  Administered 2023-06-13 – 2023-06-15 (×4): 100 mg via ORAL
  Filled 2023-06-13 (×4): qty 1

## 2023-06-13 MED ORDER — KETOROLAC TROMETHAMINE 15 MG/ML IJ SOLN: 15.0000 mg | Freq: Four times a day (QID) | INTRAMUSCULAR | Status: DC | PRN

## 2023-06-13 MED ORDER — LACTATED RINGERS IV SOLN: INTRAVENOUS | Status: DC

## 2023-06-13 MED ORDER — PANTOPRAZOLE SODIUM 40 MG IV SOLR
40.0000 mg | Freq: Every day | INTRAVENOUS | Status: DC
  Administered 2023-06-13 – 2023-06-14 (×2): 40 mg via INTRAVENOUS
  Filled 2023-06-13 (×2): qty 10

## 2023-06-13 MED ORDER — PROPOFOL 10 MG/ML IV BOLUS
INTRAVENOUS | Status: AC
  Filled 2023-06-13: qty 20

## 2023-06-13 MED ORDER — SCOPOLAMINE 1 MG/3DAYS TD PT72
1.0000 | MEDICATED_PATCH | TRANSDERMAL | Status: DC
  Administered 2023-06-13: 1.5 mg via TRANSDERMAL
  Filled 2023-06-13: qty 1

## 2023-06-13 MED ORDER — PHENYLEPHRINE 80 MCG/ML (10ML) SYRINGE FOR IV PUSH (FOR BLOOD PRESSURE SUPPORT)
PREFILLED_SYRINGE | INTRAVENOUS | Status: AC
  Filled 2023-06-13: qty 10

## 2023-06-13 MED ORDER — BUPIVACAINE LIPOSOME 1.3 % IJ SUSP
INTRAMUSCULAR | Status: AC
  Filled 2023-06-13: qty 20

## 2023-06-13 MED ORDER — HYDRALAZINE HCL 20 MG/ML IJ SOLN: 10.0000 mg | INTRAMUSCULAR | Status: DC | PRN

## 2023-06-13 MED ORDER — METHOCARBAMOL 1000 MG/10ML IJ SOLN: 500.0000 mg | Freq: Four times a day (QID) | INTRAVENOUS | Status: DC | PRN

## 2023-06-13 MED ORDER — CHLORHEXIDINE GLUCONATE 0.12 % MT SOLN
15.0000 mL | Freq: Once | OROMUCOSAL | Status: AC
  Administered 2023-06-13: 15 mL via OROMUCOSAL

## 2023-06-13 MED ORDER — FENTANYL CITRATE (PF) 100 MCG/2ML IJ SOLN
INTRAMUSCULAR | Status: AC
  Filled 2023-06-13: qty 2

## 2023-06-13 MED ORDER — CEFAZOLIN SODIUM-DEXTROSE 2-4 GM/100ML-% IV SOLN
2.0000 g | INTRAVENOUS | Status: AC
  Administered 2023-06-13: 2 g via INTRAVENOUS
  Filled 2023-06-13: qty 100

## 2023-06-13 MED ORDER — ALBUTEROL SULFATE (2.5 MG/3ML) 0.083% IN NEBU: 3.0000 mL | INHALATION_SOLUTION | Freq: Four times a day (QID) | RESPIRATORY_TRACT | Status: DC | PRN

## 2023-06-13 MED ORDER — FENTANYL CITRATE PF 50 MCG/ML IJ SOSY
PREFILLED_SYRINGE | INTRAMUSCULAR | Status: AC
  Administered 2023-06-13: 50 ug via INTRAVENOUS
  Filled 2023-06-13: qty 1

## 2023-06-13 MED ORDER — EPHEDRINE 5 MG/ML INJ
INTRAVENOUS | Status: AC
  Filled 2023-06-13: qty 5

## 2023-06-13 MED ORDER — LIDOCAINE HCL 2 % IJ SOLN
INTRAMUSCULAR | Status: AC
  Filled 2023-06-13: qty 20

## 2023-06-13 MED ORDER — BUPIVACAINE-EPINEPHRINE (PF) 0.25% -1:200000 IJ SOLN
INTRAMUSCULAR | Status: DC | PRN
  Administered 2023-06-13: 50 mL

## 2023-06-13 MED ORDER — MIDAZOLAM HCL 2 MG/2ML IJ SOLN
INTRAMUSCULAR | Status: AC
  Filled 2023-06-13: qty 2

## 2023-06-13 MED ORDER — DEXMEDETOMIDINE HCL IN NACL 80 MCG/20ML IV SOLN
INTRAVENOUS | Status: AC
  Filled 2023-06-13: qty 20

## 2023-06-13 MED ORDER — SUGAMMADEX SODIUM 200 MG/2ML IV SOLN
INTRAVENOUS | Status: DC | PRN
  Administered 2023-06-13: 200 mg via INTRAVENOUS

## 2023-06-13 MED ORDER — 0.9 % SODIUM CHLORIDE (POUR BTL) OPTIME
TOPICAL | Status: DC | PRN
  Administered 2023-06-13: 1000 mL

## 2023-06-13 MED ORDER — ONDANSETRON 4 MG PO TBDP: 4.0000 mg | ORAL_TABLET | Freq: Four times a day (QID) | ORAL | Status: DC | PRN

## 2023-06-13 MED ORDER — SIMETHICONE 80 MG PO CHEW
40.0000 mg | CHEWABLE_TABLET | Freq: Four times a day (QID) | ORAL | Status: DC | PRN
  Administered 2023-06-13: 40 mg via ORAL
  Filled 2023-06-13: qty 1

## 2023-06-13 MED ORDER — AMISULPRIDE (ANTIEMETIC) 5 MG/2ML IV SOLN
10.0000 mg | Freq: Once | INTRAVENOUS | Status: AC | PRN
  Administered 2023-06-13: 10 mg via INTRAVENOUS

## 2023-06-13 MED ORDER — LIDOCAINE 2% (20 MG/ML) 5 ML SYRINGE
INTRAMUSCULAR | Status: DC | PRN
  Administered 2023-06-13: 60 mg via INTRAVENOUS

## 2023-06-13 MED ORDER — INDOCYANINE GREEN 25 MG IV SOLR
2.5000 mg | Freq: Once | INTRAVENOUS | Status: AC
  Administered 2023-06-13: 2.5 mg via INTRAVENOUS

## 2023-06-13 MED ORDER — EPHEDRINE SULFATE-NACL 50-0.9 MG/10ML-% IV SOSY
PREFILLED_SYRINGE | INTRAVENOUS | Status: DC | PRN
  Administered 2023-06-13: 10 mg via INTRAVENOUS
  Administered 2023-06-13: 5 mg via INTRAVENOUS

## 2023-06-13 MED ORDER — ONDANSETRON HCL 4 MG/2ML IJ SOLN
INTRAMUSCULAR | Status: DC | PRN
  Administered 2023-06-13: 4 mg via INTRAVENOUS

## 2023-06-13 MED ORDER — ACETAMINOPHEN 500 MG PO TABS
1000.0000 mg | ORAL_TABLET | ORAL | Status: AC
  Administered 2023-06-13: 1000 mg via ORAL
  Filled 2023-06-13: qty 2

## 2023-06-13 MED ORDER — DIPHENHYDRAMINE HCL 25 MG PO CAPS: 25.0000 mg | ORAL_CAPSULE | Freq: Four times a day (QID) | ORAL | Status: DC | PRN

## 2023-06-13 MED ORDER — PROPOFOL 10 MG/ML IV BOLUS
INTRAVENOUS | Status: DC | PRN
  Administered 2023-06-13: 150 mg via INTRAVENOUS

## 2023-06-13 SURGICAL SUPPLY — 76 items
ADH SKN CLS APL DERMABOND .7 (GAUZE/BANDAGES/DRESSINGS) ×1
ADH SKNCLS APL OCTYL .7 VIOL (GAUZE/BANDAGES/DRESSINGS) ×1
ANTIFOG SOL W/FOAM PAD STRL (MISCELLANEOUS) ×1
APL PRP STRL LF DISP 70% ISPRP (MISCELLANEOUS) ×1
APPLIER CLIP 5 13 M/L LIGAMAX5 (MISCELLANEOUS)
APPLIER CLIP ROT 10 11.4 M/L (STAPLE)
APPLIER CLIP XI LRG REUSE DVNC (INSTRUMENTS) ×1
APR CLIP 53D 8 LRG 33.27 (INSTRUMENTS) ×1
APR CLP MED LRG 11.4X10 (STAPLE)
APR CLP MED LRG 5 ANG JAW (MISCELLANEOUS)
BAG COUNTER SPONGE SURGICOUNT (BAG) ×2 IMPLANT
BAG SPNG CNTER NS LX DISP (BAG) ×1
BLADE SURG SZ11 CARB STEEL (BLADE) ×2 IMPLANT
CHLORAPREP W/TINT 26 (MISCELLANEOUS) ×2 IMPLANT
CLIP APPLIE 5 13 M/L LIGAMAX5 (MISCELLANEOUS) IMPLANT
CLIP APPLIE ROT 10 11.4 M/L (STAPLE) IMPLANT
CLIP APPLIE XI LRG REUSE DVNC (INSTRUMENTS) IMPLANT
CLIP LIGATING HEM O LOK PURPLE (MISCELLANEOUS) IMPLANT
COVER SURGICAL LIGHT HANDLE (MISCELLANEOUS) ×2 IMPLANT
COVER TIP SHEARS 8 DVNC (MISCELLANEOUS) IMPLANT
DERMABOND ADVANCED .7 DNX12 (GAUZE/BANDAGES/DRESSINGS) ×2 IMPLANT
DRAIN PENROSE 0.5X18 (DRAIN) IMPLANT
DRAPE ARM DVNC X/XI (DISPOSABLE) ×8 IMPLANT
DRAPE COLUMN DVNC XI (DISPOSABLE) ×2 IMPLANT
DRIVER NDL LRG 8 DVNC XI (INSTRUMENTS) ×4 IMPLANT
DRIVER NDL MEGA SUTCUT DVNCXI (INSTRUMENTS) ×2 IMPLANT
DRIVER NDLE LRG 8 DVNC XI (INSTRUMENTS) IMPLANT
DRIVER NDLE MEGA SUTCUT DVNCXI (INSTRUMENTS) ×1 IMPLANT
ELECT REM PT RETURN 15FT ADLT (MISCELLANEOUS) ×2 IMPLANT
ENDOLOOP SUT PDS II 0 18 (SUTURE) IMPLANT
FORCEPS BPLR FENES DVNC XI (FORCEP) IMPLANT
GAUZE 4X4 16PLY ~~LOC~~+RFID DBL (SPONGE) ×2 IMPLANT
GLOVE BIO SURGEON STRL SZ 6 (GLOVE) ×6 IMPLANT
GLOVE INDICATOR 6.5 STRL GRN (GLOVE) ×6 IMPLANT
GLOVE SS BIOGEL STRL SZ 6 (GLOVE) ×2 IMPLANT
GOWN STRL REUS W/ TWL LRG LVL3 (GOWN DISPOSABLE) ×4 IMPLANT
GOWN STRL REUS W/ TWL XL LVL3 (GOWN DISPOSABLE) IMPLANT
GOWN STRL REUS W/TWL LRG LVL3 (GOWN DISPOSABLE) ×2
GOWN STRL REUS W/TWL XL LVL3 (GOWN DISPOSABLE)
GRASPER TIP-UP FEN DVNC XI (INSTRUMENTS) ×2 IMPLANT
IRRIG SUCT STRYKERFLOW 2 WTIP (MISCELLANEOUS) ×1
IRRIGATION SUCT STRKRFLW 2 WTP (MISCELLANEOUS) ×2 IMPLANT
KIT BASIN OR (CUSTOM PROCEDURE TRAY) ×2 IMPLANT
KIT TURNOVER KIT A (KITS) IMPLANT
LUBRICANT JELLY K Y 4OZ (MISCELLANEOUS) IMPLANT
MARKER SKIN DUAL TIP RULER LAB (MISCELLANEOUS) ×2 IMPLANT
NDL HYPO 22X1.5 SAFETY MO (MISCELLANEOUS) ×2 IMPLANT
NDL INSUFFLATION 14GA 120MM (NEEDLE) ×2 IMPLANT
NEEDLE HYPO 22X1.5 SAFETY MO (MISCELLANEOUS) ×1 IMPLANT
NEEDLE INSUFFLATION 14GA 120MM (NEEDLE) ×1 IMPLANT
PACK CARDIOVASCULAR III (CUSTOM PROCEDURE TRAY) ×2 IMPLANT
PAD POSITIONING PINK XL (MISCELLANEOUS) ×2 IMPLANT
RETRACTOR GRSP SML 8 DVNC XI (INSTRUMENTS) ×2 IMPLANT
SCISSORS LAP 5X35 DISP (ENDOMECHANICALS) IMPLANT
SCISSORS MNPLR CVD DVNC XI (INSTRUMENTS) ×2 IMPLANT
SEAL UNIV 5-12 XI (MISCELLANEOUS) ×6 IMPLANT
SEALER VESSEL EXT DVNC XI (MISCELLANEOUS) ×2 IMPLANT
SOL ELECTROSURG ANTI STICK (MISCELLANEOUS) ×1
SOLUTION ANTFG W/FOAM PAD STRL (MISCELLANEOUS) ×2 IMPLANT
SOLUTION ELECTROSURG ANTI STCK (MISCELLANEOUS) ×2 IMPLANT
SPIKE FLUID TRANSFER (MISCELLANEOUS) ×2 IMPLANT
SUT ETHIBOND 0 36 GRN (SUTURE) ×4 IMPLANT
SUT MNCRL AB 4-0 PS2 18 (SUTURE) ×2 IMPLANT
SUT SILK 0 SH 30 (SUTURE) IMPLANT
SUT SILK 2 0 SH (SUTURE) IMPLANT
SYR 20ML ECCENTRIC (SYRINGE) IMPLANT
SYR 20ML LL LF (SYRINGE) ×2 IMPLANT
TIP INNERVISION DETACH 40FR (MISCELLANEOUS) IMPLANT
TIP INNERVISION DETACH 50FR (MISCELLANEOUS) IMPLANT
TIP INNERVISION DETACH 56FR (MISCELLANEOUS) IMPLANT
TOWEL OR 17X26 10 PK STRL BLUE (TOWEL DISPOSABLE) ×2 IMPLANT
TOWEL OR NON WOVEN STRL DISP B (DISPOSABLE) ×2 IMPLANT
TRAY FOLEY MTR SLVR 14FR STAT (SET/KITS/TRAYS/PACK) IMPLANT
TRAY FOLEY MTR SLVR 16FR STAT (SET/KITS/TRAYS/PACK) IMPLANT
TROCAR ADV FIXATION 5X100MM (TROCAR) ×2 IMPLANT
TUBING INSUFFLATION 10FT LAP (TUBING) ×2 IMPLANT

## 2023-06-13 NOTE — Transfer of Care (Signed)
Immediate Anesthesia Transfer of Care Note  Patient: Abigail Kramer  Procedure(s) Performed: Procedure(s): ROBOTIC HIATAL HERNIA REPAIR WITH FUNDOPLICATION, AND UPPER ENDOSCOPY AND A ROBOTIC ASSISTED LAP CHOLECYSTECTOMY (N/A)  Patient Location: PACU  Anesthesia Type:General  Level of Consciousness: Alert, Awake, Oriented  Airway & Oxygen Therapy: Patient Spontanous Breathing  Post-op Assessment: Report given to RN  Post vital signs: Reviewed and stable  Last Vitals:  Vitals:   06/13/23 0620  BP: 130/84  Pulse: 64  Resp: 14  Temp: 36.7 C  SpO2: 96%    Complications: No apparent anesthesia complications

## 2023-06-13 NOTE — H&P (Signed)
   Abigail Kramer [id number]   Referring Provider:  Nanadigam, Kavitha V, MD   Subjective   Chief Complaint: New Consultation ( Hiatal hernia repair)     History of Present Illness:    Very pleasant 57-year-old woman with history of anxiety, depression, fibroids, ovarian cyst, and GERD who was referred by Dr. Nandigam for evaluation of a hiatal hernia.  She has suffered with reflux for many years, and recently has had issues with epigastric and right upper quadrant pain and has undergone a fairly extensive workup as listed below.  Currently her symptoms include dysphagia which she describes as a sensation that things stick in the upper chest region; shortness of breath while eating and occasionally awakening her at night, occasional breakthrough reflux despite PPI.  She sleeps at an incline which does seem to help with the nocturnal issues.  She also continues to have intermittent right upper quadrant pain which is very focal.  She states that it is both brought on by eating and when she is about to eat.  In the past she has treated this with over-the-counter antacids with some relief.  The last very severe episode she had came on after eating hamburger which was quite greasy, and this was about a year ago.   The patient had an upper endoscopy on November 05, 2022 that demonstrated short segment Barrett's spanning about 3 cm in length, 1 benign intrinsic mild stenosis that was dilated, a 5 cm hiatal hernia, gastritis.  Biopsies confirm Barrett's without dysplasia, negative for H. pylori.  She is scheduled for esophageal manometry on May 29.  Had a CT of the abdomen pelvis in February 2023 which I reviewed, this does show the hiatal hernia.  Had a right upper quadrant ultrasound in July 2023 which was negative for gallstones although did note a small 4 mm gallbladder polyp.  She also had a HIDA in July 2023 with gallbladder ejection fraction 93%.   previous abdominal surgery includes tubal  ligation in 1991, partial hysterectomy in 2014.    Review of Systems: A complete review of systems was obtained from the patient.  I have reviewed this information and discussed as appropriate with the patient.  See HPI as well for other ROS.   Medical History: Past Medical History:  Diagnosis Date   Anxiety    Arthritis    GERD (gastroesophageal reflux disease)     There is no problem list on file for this patient.   Past Surgical History:  Procedure Laterality Date   LAPAROSCOPIC TUBAL LIGATION  1991   HYSTERECTOMY  2014   CATARACT EXTRACTION  2020   x2   RIGHT EYE SURGERY Right 03/2023   LEFT 02/2023     No Known Allergies  Current Outpatient Medications on File Prior to Visit  Medication Sig Dispense Refill   buPROPion (WELLBUTRIN XL) 150 MG XL tablet Take 150 mg by mouth once daily     estradioL (ESTRACE) 0.01 % (0.1 mg/gram) vaginal cream INSERT 1/2 GRAM VAGINALLY TWICE A WEEK AS NEEDED     omeprazole (PRILOSEC) 40 MG DR capsule Take 40 mg by mouth 2 (two) times daily     No current facility-administered medications on file prior to visit.    History reviewed. No pertinent family history.   Social History   Tobacco Use  Smoking Status Never  Smokeless Tobacco Never     Social History   Socioeconomic History   Marital status: Married  Tobacco Use   Smoking   status: Never   Smokeless tobacco: Never  Substance and Sexual Activity   Alcohol use: Yes   Drug use: Never    Objective:    Vitals:   04/05/23 1016 04/05/23 1017  BP: (!) 154/105   Pulse: 75   Temp: 37 C (98.6 F)   SpO2: 99%   Weight: 77.4 kg (170 lb 9.6 oz)   Height: 170.2 cm (5' 7")   PainSc:  0-No pain  PainLoc:  Abdomen    Body mass index is 26.72 kg/m.  Gen: A&Ox3, no distress  Unlabored respirations  Assessment and Plan:  Diagnoses and all orders for this visit:  Hiatal hernia with gastroesophageal reflux  Barrett's esophagus without dysplasia Comments: EGD  10/2022, Dr. Nandigam  Biliary dyskinesia Comments: EF 93% on HIDA in July 2023    I recommend proceeding with robotic hiatal hernia repair with fundoplication and given elevated gallbladder ejection fraction and right upper quadrant pain associated with meals I think it is reasonable to proceed additionally with concomitant cholecystectomy.  We discussed the relevant anatomy using a diagram to demonstrate and went over the surgical technique.  We discussed risks of bleeding, infection, pain, scarring, injury to intra-abdominal or mediastinal structures, hernia recurrence, slipped or dehisced fundoplication, dysphagia which may be chronic, gas bloat syndrome or prolonged poor gastric emptying, bile leak, bile duct injury, failure to completely resolve symptoms, as well as general cardiovascular, pulmonary, and thromboembolic risks.  Questions welcomed and answered to her satisfaction.  She would like to proceed.  We will plan to do this after her manometry is complete which will give us better information about how aggressive we can be with her fundoplication and will help delineate expectations for postoperative esophageal motility/dysphagia issues.  She has a family vacation at the beginning of June so that timing will work out quite well.   Greg Eckrich AMANDA Zelena Bushong, MD      

## 2023-06-13 NOTE — Op Note (Signed)
Operative Note  CAMRON ESSMAN  [id number]  [id number]  06/13/2023   Surgeon: Phylliss Blakes MD FACS   Assistant: Gaynelle Adu MD FACS   Procedure performed: 1.  Robotic repair of type III hiatal hernia with toupet fundoplication 2.  Robotic cholecystectomy with near infrared fluorescent cholangiography 3.  Upper endoscopy   Preop diagnosis: Hiatal hernia with GERD and Barrett's esophagus, biliary dyskinesia Post-op diagnosis/intraop findings: Same, chronic cholecystitis   Specimens: gallbladder  Retained items: no  EBL: minimal cc Complications: none   Description of procedure: After obtaining informed consent the patient was taken to the operating room and placed supine on operating room table where general endotracheal anesthesia was initiated, preoperative antibiotics were administered, SCDs applied, and a formal timeout was performed.  A Foley catheter was inserted under sterile conditions.  The abdomen was prepped and draped in usual sterile fashion and peritoneal access gained using a left subcostal Veress needle; insufflation to 15 mmHg ensued without incident.  A periumbilical 8 mm trocar was placed followed by the camera.  The abdominal cavity was inspected and confirmed to be free of injury from our entry or any other abnormalities.  Bilateral laparoscopic assisted tap blocks were performed with Exparel quarter percent Marcaine with epinephrine and the remaining trocars were inserted under direct visualization as well as a subxiphoid liver retractor to elevate the left lobe.  The robot was then docked and all instruments inserted under direct visualization.   Hiatal hernia was visualized and beginning anteriorly, the hernia sac was divided and circumferentially dissected off of the crura until it could be reduced into the abdomen.  There was a moderate degree of fat infiltration along the hernia sac anteriorly and inflammatory adhesions more on the left crus than the right.  We are  able to dissect the right crus and crural decussation quite well.  We turned to the patient's left and divided the short gastric vessels, connecting this with the prior dissection to completely circumferentially separate the hernia sac from the hiatus.  The hernia sac was quite small.  This was excised with the vessel sealer.  The patient does have an aberrant vessel along the lesser sac coursing towards the liver which was at least 5 mm in diameter; this was preserved.  A Penrose was placed around the distal esophagus and we proceeded to mobilize the mediastinal esophagus.  Both vagus nerves were visualized and protected throughout the dissection.  The mobilization proceeded as far up into the mediastinum as possible while visualization was adequate.  Once we had reached the maximum esophageal mobilization, I performed an upper endoscopy.  The lumen of the esophagus was inspected and confirmed to be free of any injury.  We did identify the Z-line as well as some tongues of salmon-colored tissue consistent with her known history of Barrett's esophagus.  We had achieved about 4 cm of intra-abdominal esophageal length.  The stomach and esophagus were desufflated and the endoscope was removed.  The crura were reapproximated posteriorly with 3 simple interrupted 0 Ethibond's, narrowing the hiatus to about 2.5 cm in diameter, just sufficient for the decompressed esophagus.  We then brought the mobilized fundus posteriorly around the esophagus and performed a 270 degree posterior wrap using 3 simple interrupted 0 Ethibond's on either side.  On completion this seems to rest tension free within the abdominal cavity and the esophagus does not appear to have any significant angulation or twisting.  A single gastropexy suture between the posterior wrap and the right crus  was performed again with 0 Ethibond.   We then turned to the gallbladder which was quite distended and did have adhesions to the omentum as well as to the  proximal duodenum.  The omental adhesions were taken down bluntly and with cautery until the fundus could be grasped and retracted cephalad.  The duodenal adhesions were carefully swept away and the duodenum was protected.  Infrared fluorescent cholangiography was then activated and we were able to visualize the gallbladder, cystic duct, as well as the common hepatic duct which was well away from the region of dissection.  Using a combination of hook cautery and blunt dissection, the cystic duct and cystic artery were dissected and the gallbladder was lifted off of the liver plate.  The critical view of safety was achieved with the cystic duct, cystic artery and no other structures entering the gallbladder and the liver fossa visualized between them.  This view was concordant with the view on ICG which was again activated.  The cystic duct was ligated with 3 clips proximally and 1 distally and then divided sharply; the cystic artery was ligated with 1 clip proximally and 1 distally and then divided sharply.  The gallbladder was then dissected off the liver bed using cautery and blunt dissection and then was placed in an Endo Catch bag and removed intact through the left paramedian 8 mm trocar site.  The liver bed was inspected and hemostasis was excellent.  There was no bile leak from the liver bed or cystic duct stump on direct view nor on near infrared fluorescent cholangiography.  At this point the robot was undocked after removing all instruments under direct visualization.  The extraction port site fascia was closed with 0 Vicryl using laparoscopic suture passer under direct visualization.  The liver retractor was removed under direct visualization.  The abdomen was then desufflated and all trocars removed.  The skin incisions were closed with subcuticular 4-0 Monocryl and Dermabond. All counts were correct at the completion of the case.  The patient was then awakened, extubated and taken to PACU in stable  condition.

## 2023-06-13 NOTE — Anesthesia Procedure Notes (Signed)
Procedure Name: Intubation Date/Time: 06/13/2023 7:29 AM  Performed by: Basilio Cairo, CRNAPre-anesthesia Checklist: Patient identified, Patient being monitored, Timeout performed, Emergency Drugs available and Suction available Patient Re-evaluated:Patient Re-evaluated prior to induction Oxygen Delivery Method: Circle system utilized Preoxygenation: Pre-oxygenation with 100% oxygen Induction Type: IV induction Ventilation: Mask ventilation without difficulty Laryngoscope Size: Mac, 3 and Miller Grade View: Grade I Tube type: Oral Tube size: 7.0 mm Number of attempts: 1 Airway Equipment and Method: Stylet Placement Confirmation: ETT inserted through vocal cords under direct vision, positive ETCO2 and breath sounds checked- equal and bilateral Secured at: 22 cm Tube secured with: Tape Dental Injury: Teeth and Oropharynx as per pre-operative assessment  Comments: DL x 1 by EMT student w [facility] Sampson Goon, R. At bedside to assist. Successful placement. No issues.

## 2023-06-13 NOTE — Anesthesia Postprocedure Evaluation (Signed)
Anesthesia Post Note  Patient: Abigail Kramer  Procedure(s) Performed: ROBOTIC HIATAL HERNIA REPAIR WITH FUNDOPLICATION, AND UPPER ENDOSCOPY AND A ROBOTIC ASSISTED LAP CHOLECYSTECTOMY (Abdomen)     Patient location during evaluation: PACU Anesthesia Type: General Level of consciousness: awake and alert Pain management: pain level controlled Vital Signs Assessment: post-procedure vital signs reviewed and stable Respiratory status: spontaneous breathing, nonlabored ventilation, respiratory function stable and patient connected to nasal cannula oxygen Cardiovascular status: blood pressure returned to baseline and stable Postop Assessment: no apparent nausea or vomiting Anesthetic complications: no  No notable events documented.  Last Vitals:  Vitals:   06/13/23 1257 06/13/23 1348  BP: 138/80 (!) 142/77  Pulse: 71 77  Resp: 16 17  Temp: 37 C (!) 36.4 C  SpO2: 99% 97%    Last Pain:  Vitals:   06/13/23 1348  TempSrc: Oral  PainSc:                  Kennieth Rad

## 2023-06-13 NOTE — Anesthesia Preprocedure Evaluation (Signed)
Anesthesia Evaluation  Patient identified by MRN, date of birth, ID band Patient awake    Reviewed: Allergy & Precautions, NPO status , Patient's Chart, lab work & pertinent test results  Airway Mallampati: II  TM Distance: >3 FB Neck ROM: Full    Dental  (+) Dental Advisory Given   Pulmonary neg pulmonary ROS   breath sounds clear to auscultation       Cardiovascular negative cardio ROS  Rhythm:Regular Rate:Normal     Neuro/Psych negative neurological ROS     GI/Hepatic Neg liver ROS,GERD  ,,  Endo/Other  Hypothyroidism    Renal/GU negative Renal ROS     Musculoskeletal   Abdominal   Peds  Hematology negative hematology ROS (+)   Anesthesia Other Findings   Reproductive/Obstetrics                             Anesthesia Physical Anesthesia Plan  ASA: 2  Anesthesia Plan: General   Post-op Pain Management: Tylenol PO (pre-op)*, Gabapentin PO (pre-op)* and Ketamine IV*   Induction: Intravenous  PONV Risk Score and Plan: 4 or greater and Dexamethasone, Ondansetron, Midazolam, Treatment may vary due to age or medical condition and Scopolamine patch - Pre-op  Airway Management Planned: Oral ETT  Additional Equipment: None  Intra-op Plan:   Post-operative Plan: Extubation in OR  Informed Consent: I have reviewed the patients History and Physical, chart, labs and discussed the procedure including the risks, benefits and alternatives for the proposed anesthesia with the patient or authorized representative who has indicated his/her understanding and acceptance.     Dental advisory given  Plan Discussed with: CRNA  Anesthesia Plan Comments:        Anesthesia Quick Evaluation

## 2023-06-14 ENCOUNTER — Encounter (HOSPITAL_COMMUNITY): Payer: Self-pay | Admitting: Surgery

## 2023-06-14 ENCOUNTER — Ambulatory Visit (HOSPITAL_COMMUNITY): Payer: Managed Care, Other (non HMO)

## 2023-06-14 DIAGNOSIS — Z79899 Other long term (current) drug therapy: Secondary | ICD-10-CM | POA: Diagnosis not present

## 2023-06-14 DIAGNOSIS — K219 Gastro-esophageal reflux disease without esophagitis: Secondary | ICD-10-CM | POA: Diagnosis present

## 2023-06-14 DIAGNOSIS — K449 Diaphragmatic hernia without obstruction or gangrene: Secondary | ICD-10-CM | POA: Diagnosis present

## 2023-06-14 DIAGNOSIS — Z7952 Long term (current) use of systemic steroids: Secondary | ICD-10-CM | POA: Diagnosis not present

## 2023-06-14 DIAGNOSIS — Z90711 Acquired absence of uterus with remaining cervical stump: Secondary | ICD-10-CM | POA: Diagnosis not present

## 2023-06-14 DIAGNOSIS — F419 Anxiety disorder, unspecified: Secondary | ICD-10-CM | POA: Diagnosis present

## 2023-06-14 DIAGNOSIS — Z9851 Tubal ligation status: Secondary | ICD-10-CM | POA: Diagnosis not present

## 2023-06-14 DIAGNOSIS — K2270 Barrett's esophagus without dysplasia: Secondary | ICD-10-CM | POA: Diagnosis present

## 2023-06-14 DIAGNOSIS — Z9889 Other specified postprocedural states: Secondary | ICD-10-CM | POA: Diagnosis present

## 2023-06-14 DIAGNOSIS — K824 Cholesterolosis of gallbladder: Secondary | ICD-10-CM | POA: Diagnosis present

## 2023-06-14 LAB — CBC
HCT: 34.3 % — ABNORMAL LOW (ref 36.0–46.0)
Hemoglobin: 10.8 g/dL — ABNORMAL LOW (ref 12.0–15.0)
MCH: 29 pg (ref 26.0–34.0)
MCHC: 31.5 g/dL (ref 30.0–36.0)
MCV: 92 fL (ref 80.0–100.0)
Platelets: 244 10*3/uL (ref 150–400)
RBC: 3.73 MIL/uL — ABNORMAL LOW (ref 3.87–5.11)
RDW: 13.7 % (ref 11.5–15.5)
WBC: 11.6 10*3/uL — ABNORMAL HIGH (ref 4.0–10.5)
nRBC: 0 % (ref 0.0–0.2)

## 2023-06-14 LAB — BASIC METABOLIC PANEL
Anion gap: 8 (ref 5–15)
BUN: 8 mg/dL (ref 6–20)
CO2: 25 mmol/L (ref 22–32)
Calcium: 8 mg/dL — ABNORMAL LOW (ref 8.9–10.3)
Chloride: 105 mmol/L (ref 98–111)
Creatinine, Ser: 0.69 mg/dL (ref 0.44–1.00)
GFR, Estimated: >60 mL/min (ref >60)
Glucose, Bld: 98 mg/dL (ref 70–99)
Potassium: 3.7 mmol/L (ref 3.5–5.1)
Sodium: 138 mmol/L (ref 135–145)

## 2023-06-14 LAB — SURGICAL PATHOLOGY

## 2023-06-14 LAB — MAGNESIUM: Magnesium: 2 mg/dL (ref 1.7–2.4)

## 2023-06-14 MED ORDER — TRAMADOL HCL 50 MG PO TABS: 50.0000 mg | ORAL_TABLET | Freq: Four times a day (QID) | ORAL | 0 refills | Status: AC | PRN

## 2023-06-14 MED ORDER — SODIUM CHLORIDE 0.9 % IV SOLN: INTRAVENOUS | Status: DC

## 2023-06-14 MED ORDER — DEXAMETHASONE SODIUM PHOSPHATE 10 MG/ML IJ SOLN
8.0000 mg | Freq: Four times a day (QID) | INTRAMUSCULAR | Status: DC
  Administered 2023-06-14 – 2023-06-15 (×5): 8 mg via INTRAVENOUS
  Filled 2023-06-14 (×5): qty 1

## 2023-06-14 MED ORDER — DOCUSATE SODIUM 100 MG PO CAPS: 100.0000 mg | ORAL_CAPSULE | Freq: Two times a day (BID) | ORAL | 0 refills | Status: AC

## 2023-06-14 MED ORDER — IOHEXOL 300 MG/ML  SOLN
50.0000 mL | Freq: Once | INTRAMUSCULAR | Status: AC | PRN
  Administered 2023-06-14: 50 mL via ORAL

## 2023-06-14 MED ORDER — ONDANSETRON 4 MG PO TBDP: 4.0000 mg | ORAL_TABLET | Freq: Three times a day (TID) | ORAL | 0 refills | Status: AC | PRN

## 2023-06-14 NOTE — Progress Notes (Addendum)
S: No acute events, reports pain is well-controlled but does note pain when she is up moving around mostly in the most lateral left-sided trocar site.  Some nausea with walking yesterday but this got better with her second walk in the evening.  No dysphagia to liquids.  O: Vitals, labs, intake/output, and orders reviewed at this time.  Afebrile, no tachycardia, normotensive, saturating 98% on room air.  P.o. 960; uop 2800.  BMP and magnesium unremarkable, white count 11.6 (8.7 preop), hemoglobin 10.8 (13.1), platelets 244 (321)  Has not taken any PRN meds  Gen: A&Ox3, no distress  Chest: unlabored respirations, RRR Abd: soft, minimally appropriately tender, mildly distended, incision(s) c/d/i with Dermabond, no cellulitis or hematoma Ext: warm, no edema Neuro: grossly normal  Lines/tubes/drains: PIV  A/P: Postop day 1 status post robotic hiatal hernia repair with toupet fundoplication and cholecystectomy -Upper GI this morning -Recheck hemoglobin this afternoon; hold lovenox and toradol (none given).  No clinical evidence of active bleeding -Advance to pured diet.  Anticipate discharge home later today if continuing to progress well.  Phylliss Blakes, MD [facility], [place]   Addendum 14:15: UGI results reviewed. Patient did begin having dysphagia symptoms after breakfast this morning after I saw her. Liquids seem to sit in her chest, a/w some burning and pressure type pain. Oddly, pills seem to be going down fine. She is clearing secretions. Will trial decadron and full liquids. Discussed small sips, eating slowly, eating upright. Hopefully this is due to edema, given she tolerated liquids will for about 18 hours post op, and will resolve with time; we did discuss if symptoms do not improve, possible return to OR to take down the (partial) wrap.

## 2023-06-14 NOTE — Progress Notes (Signed)
   06/14/23 1207  TOC Brief Assessment  Insurance and Status Reviewed  Patient has primary care physician Yes  Home environment has been reviewed Resides with spouse  Prior level of function: Independent at baseline  Prior/Current Home Services No current home services  Social Determinants of Health Reivew SDOH reviewed no interventions necessary  Readmission risk has been reviewed Yes  Transition of care needs no transition of care needs at this time

## 2023-06-14 NOTE — Discharge Instructions (Addendum)
POST OP INSTRUCTIONS   EAT **See Diet below**  WALK Walk an hour a day (cumulative- not all at once).  Control your pain to do that.    CONTROL PAIN Control pain so that you can walk, sleep, tolerate sneezing/coughing, go up/down stairs.  HAVE A BOWEL MOVEMENT DAILY Keep your bowels regular to avoid problems.  OK to try a laxative to override constipation.  OK to use an antidiarrheal to slow down diarrhea.  Call if not better after 2 tries  CALL IF YOU HAVE PROBLEMS/CONCERNS Call if you are still struggling despite following these instructions. Call if you have concerns not answered by these instructions  Take your usually prescribed home medications unless otherwise directed. **Continue taking your omeprazole for now**  PAIN CONTROL: Pain is best controlled by a usual combination of three different methods TOGETHER: Ice/Heat Over the counter pain medication Prescription pain medication Most patients will experience some swelling and bruising around the incisions.  Ice packs or heating pads (30-60 minutes up to 6 times a day) will help. Use ice for the first few days to help decrease swelling and bruising, then switch to heat to help relax tight/sore spots and speed recovery.  Some people prefer to use ice alone, heat alone, alternating between ice & heat.  Experiment to what works for you.  Swelling and bruising can take several weeks to resolve.   It is helpful to take an over-the-counter pain medication regularly for the first few days: Naproxen (Aleve, etc)  Two 220mg  tabs twice a day OR Ibuprofen (Advil, etc) Three 200mg  tabs four times a day (every meal & bedtime) AND Acetaminophen (Tylenol, etc) 500-650mg  four times a day (every meal & bedtime) A  prescription for pain medication (such as oxycodone, hydrocodone, tramadol, gabapentin, methocarbamol, etc) should be given to you upon discharge.  Take your pain medication as prescribed, IF NEEDED.  If you are having  problems/concerns with the prescription medicine (does not control pain, nausea, vomiting, rash, itching, etc), please call us [phone] to see if we need to switch you to a different pain medicine that will work better for you and/or control your side effect better. If you need a refill on your pain medication, please give [place] 48 hour notice.  contact your pharmacy.  They will contact our office to request authorization. Prescriptions will not be filled after 5 pm or on week-ends  Avoid getting constipated.   Between the surgery and the pain medications, it is common to experience some constipation.   Increasing fluid intake and taking a fiber supplement (such as Metamucil, Citrucel, FiberCon, MiraLax, etc) 1-2 times a day regularly will usually help prevent this problem from occurring.   A mild laxative (prune juice, Milk of Magnesia, MiraLax, etc) should be taken according to package directions if there are no bowel movements after 48 hours.   Watch out for diarrhea.   If you have many loose bowel movements, simplify your diet to bland foods & liquids for a few days.   Stop any stool softeners and decrease your fiber supplement.   Switching to mild anti-diarrheal medications (Kayopectate, Pepto Bismol) can help.   If this worsens or does not improve, please call us.  Wash / shower every day.  You may shower over the skin glue which is waterproof.  Do not soak or submerge incisions.  No rubbing, scrubbing, lotions or ointments to incisions.  Glue will flake off after about 2 weeks.  You may leave the incision open  to air.  You may replace a dressing/Band-Aid to cover the incision for comfort if you wish.   ACTIVITIES as tolerated:   You may resume regular (light) daily activities beginning the next day--such as daily self-care, walking, climbing stairs--gradually increasing activities as tolerated.  If you can walk 30 minutes without difficulty, it is safe to try more intense activity such as  jogging, treadmill, bicycling, low-impact aerobics, swimming, etc. Refrain from the most intensive and strenuous activity such as sit-ups, heavy lifting (>20lb), contact sports, etc. Avoid pushing, straining bearing down, retching/gagging, and forceful coughing as much as possible.   Refrain from any heavy lifting or straining until at least 6 weeks post-op.   DO NOT PUSH THROUGH PAIN.  Let pain be your guide: If it hurts to do something, don't do it.  Pain is your body warning you to avoid that activity for another week until the pain goes down. You may drive when you are no longer taking prescription pain medication, you can comfortably wear a seatbelt, and you can safely maneuver your car and apply brakes. You may have sexual intercourse when it is comfortable.  FOLLOW UP in our office Please call [facility] at [phone] to set up an appointment to see your surgeon in the office for a follow-up appointment approximately 2-3 weeks after your surgery. Make sure that you call for this appointment the day you arrive home to insure a convenient appointment time.  10. IF YOU HAVE DISABILITY OR FAMILY LEAVE FORMS, BRING THEM TO THE OFFICE FOR PROCESSING.  DO NOT GIVE THEM TO YOUR DOCTOR.   WHEN TO CALL us [phone]: Poor pain control Reactions / problems with new medications (rash/itching, nausea, etc)  Fever over 101.5 F (38.5 C) Inability to urinate Nausea and/or vomiting Worsening swelling or bruising Continued bleeding from incision. Increased pain, redness, or drainage from the incision   The clinic staff is available to answer your questions during regular business hours (8:30am-5pm).  Please don't hesitate to call and ask to speak to one of our nurses for clinical concerns.   If you have a medical emergency, go to the nearest emergency room or call 911.  A surgeon from [facility] is always on call at the [facility], [place] [address], [address], [place], [place]  [address] ? MAIN: [phone]) [phone] ? TOLL FREE: [phone] ?  FAX [phone] [email or web address]   EATING AFTER YOUR PARAESOPHAGEAL HERNIA REPAIR SURGERY  After your esophageal surgery, expect some sticking with swallowing over the next 1-2 months.    If food sticks when you eat, it is called "dysphagia".  This is due to swelling around your esophagus at the wrap & hiatal diaphragm repair.  It will gradually ease off over the next few months.  To help you through this temporary phase, we start you out on a pureed (blenderized) diet.  Your first meal in the hospital was thin liquids.  You should have been given a pureed diet by the time you left the hospital.  We ask patients to stay on a pureed diet for the first 2-3 weeks to avoid anything getting "stuck" near your recent surgery.  Don't be alarmed if your ability to swallow doesn't progress according to this plan.  Everyone is different and some diets can advance more or less quickly.    It is often helpful to crush your medications or split them as they can sometimes stick, especially the first week  or so.   Some BASIC RULES to follow are: Maintain an upright position whenever eating or drinking. Take small bites - just a teaspoon size bite at a time. Eat slowly.  It may also help to eat only one food at a time. Consider nibbling through smaller, more frequent meals & avoid the urge to eat BIG meals Do not push through feelings of fullness, nausea, or bloatedness Do not mix solid foods and liquids in the same mouthful Try not to "wash foods down" with large gulps of liquids. Avoid carbonated (bubbly/fizzy) drinks.   Avoid foods that make you feel gassy or bloated.  Start with bland foods first.  Wait on trying greasy, fried, or spicy meals until you are tolerating more bland solids well. Understand that it will be hard to burp and belch at first.  This gradually improves with time.  Expect  to be more gassy/flatulent/bloated initially.  Walking will help your body manage it better. Consider using medications for bloating that contain simethicone such as  Maalox or Gas-X  Consider crushing her medications, especially smaller pills.  The ability to swallow pills should get easier after a few weeks Eat in a relaxed atmosphere & minimize distractions. Avoid talking while eating.   Do not use straws. Following each meal, sit in an upright position (90 degree angle) for 60 to 90 minutes.  Going for a short walk can help as well If food does stick, don't panic.  Try to relax and let the food pass on its own.  Sipping WARM LIQUID such as strong hot black tea can also help slide it down.   Be gradual in changes & use common sense:  -If you easily tolerating a certain "level" of foods, advance to the next level gradually -If you are having trouble swallowing a particular food, then avoid it.   -If food is sticking when you advance your diet, go back to thinner previous diet (the lower LEVEL) for 1-2 days.  LEVEL 1 = PUREED DIET  Do for the first 2 WEEKS AFTER SURGERY  -Foods in this group are pureed or blenderized to a smooth, mashed potato-like consistency.  -If necessary, the pureed foods can keep their shape with the addition of a thickening agent.   -Meat should be pureed to a smooth, pasty consistency.  Hot broth or gravy may be added to the pureed meat, approximately 1 oz. of liquid per 3 oz. serving of meat. -CAUTION:  If any foods do not puree into a smooth consistency, swallowing will be more difficult.  (For example, nuts or seeds sometimes do not blend well.)  Hot Foods Cold Foods  Pureed scrambled eggs and cheese Pureed cottage cheese  Baby cereals Thickened juices and nectars  Thinned cooked cereals (no lumps) Thickened milk or eggnog  Pureed [place] toast or pancakes Ensure  Mashed potatoes Ice cream  Pureed parsley, au gratin, scalloped potatoes, candied sweet  potatoes Fruit or [place] ice, sherbet  Pureed buttered or alfredo noodles Plain yogurt  Pureed vegetables (no corn or peas) Instant breakfast  Pureed soups and creamed soups Smooth pudding, mousse, custard  Pureed scalloped apples Whipped gelatin  Gravies Sugar, syrup, honey, jelly  Sauces, cheese, tomato, barbecue, white, creamed Cream  Any baby food Creamer  Alcohol in moderation (not beer or champagne) Margarine  Coffee or tea Mayonnaise   Ketchup, mustard   Apple sauce   SAMPLE MENU:  PUREED DIET Breakfast Lunch Dinner  Orange juice, 1/2 cup Cream of wheat, 1/2  cup Pineapple juice, 1/2 cup Pureed [place], barley soup, 3/4 cup Pureed Hawaiian chicken, 3 oz  Scrambled eggs, mashed or blended with cheese, 1/2 cup Tea or coffee, 1 cup  Whole milk, 1 cup  Non-dairy creamer, 2 Tbsp. Mashed potatoes, 1/2 cup Pureed cooled broccoli, 1/2 cup Apple sauce, 1/2 cup Coffee or tea Mashed potatoes, 1/2 cup Pureed spinach, 1/2 cup Frozen yogurt, 1/2 cup Tea or coffee      LEVEL 2 = SOFT DIET  After your first 2 weeks, you can advance to a soft diet.   Keep on this diet until everything goes down easily.  Hot Foods Cold Foods  White fish Cottage cheese  Stuffed fish Junior baby fruit  Baby food meals Semi thickened juices  Minced soft cooked, scrambled, poached eggs nectars  Souffle & omelets Ripe mashed bananas  Cooked cereals Canned fruit, pineapple sauce, milk  potatoes Milkshake  Buttered or Alfredo noodles Custard  Cooked cooled vegetable Puddings, including tapioca  Sherbet Yogurt  Vegetable soup or alphabet soup Fruit ice, [place] ice  Gravies Whipped gelatin  Sugar, syrup, honey, jelly Junior baby desserts  Sauces:  Cheese, creamed, barbecue, tomato, white Cream  Coffee or tea Margarine   SAMPLE MENU:  LEVEL 2 Breakfast Lunch Dinner  Orange juice, 1/2 cup Oatmeal, 1/2 cup Scrambled eggs with cheese, 1/2 cup Decaffeinated tea, 1 cup Whole milk, 1 cup Non-dairy  creamer, 2 Tbsp Pineapple juice, 1/2 cup Minced beef, 3 oz Gravy, 2 Tbsp Mashed potatoes, 1/2 cup Minced fresh broccoli, 1/2 cup Applesauce, 1/2 cup Coffee, 1 cup [place], barley soup, 3/4 cup Minced Hawaiian chicken, 3 oz Mashed potatoes, 1/2 cup Cooked spinach, 1/2 cup Frozen yogurt, 1/2 cup Non-dairy creamer, 2 Tbsp      LEVEL 3 = CHOPPED DIET  -After all the foods in level 2 (soft diet) are passing through well you should advance up to more chopped foods.  -It is still important to cut these foods into small pieces and eat slowly.  Hot Foods Cold Foods  Poultry Cottage cheese  Chopped Swedish meatballs Yogurt  Meat salads (ground or flaked meat) Milk  Flaked fish (tuna) Milkshakes  Poached or scrambled eggs Soft, cold, dry cereal  Souffles and omelets Fruit juices or nectars  Cooked cereals Chopped canned fruit  Chopped [place] toast or pancakes Canned fruit cocktail  Noodles or pasta (no rice) Pudding, mousse, custard  Cooked vegetables (no frozen peas, corn, or mixed vegetables) Green salad  Canned small sweet peas Ice cream  Creamed soup or vegetable soup Fruit ice, [place] ice  Pureed vegetable soup or alphabet soup Non-dairy creamer  Ground scalloped apples Margarine  Gravies Mayonnaise  Sauces:  Cheese, creamed, barbecue, tomato, white Ketchup  Coffee or tea Mustard   SAMPLE MENU:  LEVEL 3 Breakfast Lunch Dinner  Orange juice, 1/2 cup Oatmeal, 1/2 cup Scrambled eggs with cheese, 1/2 cup Decaffeinated tea, 1 cup Whole milk, 1 cup Non-dairy creamer, 2 Tbsp Ketchup, 1 Tbsp Margarine, 1 tsp Salt, 1/4 tsp Sugar, 2 tsp Pineapple juice, 1/2 cup Ground beef, 3 oz Gravy, 2 Tbsp Mashed potatoes, 1/2 cup Cooked spinach, 1/2 cup Applesauce, 1/2 cup Decaffeinated coffee Whole milk Non-dairy creamer, 2 Tbsp Margarine, 1 tsp Salt, 1/4 tsp Pureed [place], barley soup, 3/4 cup Barbecue chicken, 3 oz Mashed potatoes, 1/2 cup Ground fresh broccoli, 1/2 cup Frozen  yogurt, 1/2 cup Decaffeinated tea, 1 cup Non-dairy creamer, 2 Tbsp Margarine, 1 tsp Salt, 1/4 tsp Sugar, 1 tsp    LEVEL  4:  REGULAR FOODS  -Foods in this group are soft, moist, regularly textured foods.   -This level includes meat and breads, which tend to be the hardest things to swallow.   -Eat very slowly, chew well and continue to avoid carbonated drinks. -most people are at this level in 4-6 weeks  Hot Foods Cold Foods  Baked fish or skinned Soft cheeses - cottage cheese  Souffles and omelets Cream cheese  Eggs Yogurt  Stuffed shells Milk  Spaghetti with meat sauce Milkshakes  Cooked cereal Cold dry cereals (no nuts, dried fruit, coconut)  [place] toast or pancakes Crackers  Buttered toast Fruit juices or nectars  Noodles or pasta (no rice) Canned fruit  Potatoes (all types) Ripe bananas  Soft, cooked vegetables (no corn, lima, or baked beans) Peeled, ripe, fresh fruit  Creamed soups or vegetable soup Cakes (no nuts, dried fruit, coconut)  Canned chicken noodle soup Plain doughnuts  Gravies Ice cream  Bacon dressing Pudding, mousse, custard  Sauces:  Cheese, creamed, barbecue, tomato, white Fruit ice, [place] ice, sherbet  Decaffeinated tea or coffee Whipped gelatin  Pork chops Regular gelatin   Canned fruited gelatin molds   Sugar, syrup, honey, jam, jelly   Cream   Non-dairy   Margarine   Oil   Mayonnaise   Ketchup   Mustard   TROUBLESHOOTING IRREGULAR BOWELS  1) Avoid extremes of bowel movements (no bad constipation/diarrhea)  2) Miralax 17gm mixed in 8oz. water or juice-daily. May use BID as needed.  3) Gas-x,Phazyme, etc. as needed for gas & bloating.  4) Soft,bland diet. No spicy,greasy,fried foods.  5) Prilosec over-the-counter as needed  6) May hold gluten/wheat products from diet to see if symptoms improve.  7) May try probiotics (Align, Activa, etc) to help calm the bowels down  7) If symptoms become worse call back immediately.    If you have any  questions please call our office at [facility]: [phone].

## 2023-06-15 LAB — CBC
HCT: 36.1 % (ref 36.0–46.0)
Hemoglobin: 11.5 g/dL — ABNORMAL LOW (ref 12.0–15.0)
MCH: 29.1 pg (ref 26.0–34.0)
MCHC: 31.9 g/dL (ref 30.0–36.0)
MCV: 91.4 fL (ref 80.0–100.0)
Platelets: 237 10*3/uL (ref 150–400)
RBC: 3.95 MIL/uL (ref 3.87–5.11)
RDW: 13.6 % (ref 11.5–15.5)
WBC: 10.9 10*3/uL — ABNORMAL HIGH (ref 4.0–10.5)
nRBC: 0 % (ref 0.0–0.2)

## 2023-06-15 MED ORDER — DEXAMETHASONE 6 MG PO TABS: 6.0000 mg | ORAL_TABLET | Freq: Three times a day (TID) | ORAL | 0 refills | Status: AC

## 2023-06-15 NOTE — Discharge Summary (Signed)
Physician Discharge Summary  Patient ID: Abigail Kramer MRN: [id number] DOB/AGE: Jun 14, 1966 57 y.o.  Admit date: 06/13/2023 Discharge date: 06/15/2023  Admission Diagnoses:  Discharge Diagnoses:  Principal Problem:   S/P repair of paraesophageal hernia   Discharged Condition: good  Hospital Course: Robotic hiatal hernia repair with toupet fundoplication and cholecystectomy on 6/24.  Initially tolerated clear liquids well however the morning of postop day 1 began to experience dysphagia.  Hemoglobin had dropped about 2.5 points on the morning of postop day 1.  Upper GI demonstrated narrowing at the level of the wrap with some dilation of the esophagus and delayed transit, lumen measuring up to 3 mm.  Very likely this is secondary to swelling.  Steroids were initiated and the patient was maintained on a full liquid diet.  On the morning of postop day 2 her dysphagia was slightly better, and she had been able to tolerate the full liquids having completed about half of her breakfast tray.  Her hemoglobin had actually gone back up.  Her vital signs remained stable and pain/nausea were negligible.  She was deemed stable for discharge home.  Discharge Exam: Blood pressure 137/75, pulse (!) 58, temperature 97.9 F (36.6 C), temperature source Oral, resp. rate 18, weight 78.5 kg, last menstrual period 12/20/2008, SpO2 94 %.  Disposition: Discharge disposition: 01-Home or Self Care        Allergies as of 06/15/2023   No Known Allergies      Medication List     TAKE these medications    albuterol 108 (90 Base) MCG/ACT inhaler Commonly known as: VENTOLIN HFA Inhale 1-2 puffs into the lungs every 6 (six) hours as needed for wheezing or shortness of breath.   buPROPion 150 MG 24 hr tablet Commonly known as: Wellbutrin XL Take 1 tablet (150 mg total) by mouth daily.   calcium carbonate 1500 (600 Ca) MG Tabs tablet Commonly known as: OSCAL Take 1,500 mg by mouth daily with breakfast.    cyanocobalamin 1000 MCG tablet Commonly known as: VITAMIN B12 Take 1,000 mcg by mouth daily.   dexamethasone 6 MG tablet Commonly known as: DECADRON Take 1 tablet (6 mg total) by mouth 3 (three) times daily for 4 days.   docusate sodium 100 MG capsule Commonly known as: Colace Take 1 capsule (100 mg total) by mouth 2 (two) times daily. Okay to decrease to once daily or stop taking if having loose bowel movements   Magnesium 250 MG Tabs Take 250 mg by mouth daily.   omeprazole 40 MG capsule Commonly known as: PRILOSEC TAKE 1 CAPSULE BY MOUTH TWICE A DAY   ondansetron 4 MG disintegrating tablet Commonly known as: ZOFRAN-ODT Take 1 tablet (4 mg total) by mouth every 8 (eight) hours as needed for nausea or vomiting.   progesterone 100 MG capsule Commonly known as: PROMETRIUM Take 100 mg by mouth at bedtime.   thyroid 30 MG tablet Commonly known as: ARMOUR Take 15 mg by mouth daily before breakfast. In 7 days start 30mg    traMADol 50 MG tablet Commonly known as: Ultram Take 1 tablet (50 mg total) by mouth every 6 (six) hours as needed for up to 5 days (pain not relieved by tylenol, ibuprofen, rest, ice etc).        Follow-up Information     Berna Bue, MD Follow up in 3 week(s).   Specialty: General Surgery Contact information: [address] [place] [address] [phone]  Signed: Berna Bue 06/15/2023, 9:34 AM

## 2023-06-15 NOTE — Progress Notes (Signed)
Patient and husband provided with discharge education, both verbalized understanding. IV removed.

## 2023-06-16 ENCOUNTER — Telehealth: Payer: Self-pay

## 2023-06-16 NOTE — Transitions of Care (Post Inpatient/ED Visit) (Signed)
06/16/2023  Name: Abigail Kramer MRN: [id number] DOB: 1965-12-23  Today's TOC FU Call Status: Today's TOC FU Call Status:: Successful TOC FU Call Competed TOC FU Call Complete Date: 06/16/23  Transition Care Management Follow-up Telephone Call Date of Discharge: 06/15/23 Discharge Facility: [facility]) Type of Discharge: Inpatient Admission Primary Inpatient Discharge Diagnosis:: S/P repair of paraesophageal hernia How have you been since you were released from the hospital?: Better Any questions or concerns?: No  Items Reviewed: Did you receive and understand the discharge instructions provided?: Yes Medications obtained,verified, and reconciled?: Yes (Medications Reviewed) Any new allergies since your discharge?: No Dietary orders reviewed?: Yes Do you have support at home?: Yes  Medications Reviewed Today: Medications Reviewed Today     Reviewed by Merleen Nicely, LPN (Licensed Practical Nurse) on 06/16/23 at [phone]  Med List Status: <None>   Medication Order Taking? Sig Documenting Provider Last Dose Status Informant  albuterol (VENTOLIN HFA) 108 (90 Base) MCG/ACT inhaler [id number] No Inhale 1-2 puffs into the lungs every 6 (six) hours as needed for wheezing or shortness of breath. Freddy Finner, NP More than a month Active Self  buPROPion (WELLBUTRIN XL) 150 MG 24 hr tablet [id number] No Take 1 tablet (150 mg total) by mouth daily. Tower, Audrie Gallus, MD 06/12/2023 Active Self  calcium carbonate (OSCAL) 1500 (600 Ca) MG TABS tablet [id number] No Take 1,500 mg by mouth daily with breakfast. [provider] 06/12/2023 Active Self  cyanocobalamin (VITAMIN B12) 1000 MCG tablet [id number] No Take 1,000 mcg by mouth daily. [provider] 06/12/2023 Active Self  dexamethasone (DECADRON) 6 MG tablet [id number]  Take 1 tablet (6 mg total) by mouth 3 (three) times daily for 4 days. Berna Bue, MD  Active   docusate sodium (COLACE) 100 MG capsule [id number]  Take 1  capsule (100 mg total) by mouth 2 (two) times daily. Okay to decrease to once daily or stop taking if having loose bowel movements Berna Bue, MD  Active   Magnesium 250 MG TABS [id number] No Take 250 mg by mouth daily. [provider] 06/12/2023 Active Self  omeprazole (PRILOSEC) 40 MG capsule [id number] No TAKE 1 CAPSULE BY MOUTH TWICE A DAY Unk Lightning, [place] 06/12/2023 Active Self  ondansetron (ZOFRAN-ODT) 4 MG disintegrating tablet [id number]  Take 1 tablet (4 mg total) by mouth every 8 (eight) hours as needed for nausea or vomiting. Berna Bue, MD  Active   progesterone (PROMETRIUM) 100 MG capsule [id number] No Take 100 mg by mouth at bedtime. [provider] Past Week Active Self  thyroid (ARMOUR) 30 MG tablet [id number] No Take 15 mg by mouth daily before breakfast. In 7 days start 30mg  [provider] 06/12/2023 Active   traMADol (ULTRAM) 50 MG tablet [id number]  Take 1 tablet (50 mg total) by mouth every 6 (six) hours as needed for up to 5 days (pain not relieved by tylenol, ibuprofen, rest, ice etc). Berna Bue, MD  Active             Home Care and Equipment/Supplies: Were Home Health Services Ordered?: No Any new equipment or medical supplies ordered?: No  Functional Questionnaire: Do you need assistance with bathing/showering or dressing?: No Do you need assistance with meal preparation?: No Do you need assistance with eating?: No Do you have difficulty maintaining continence: No Do you need assistance with getting out of bed/getting out of a chair/moving?: No Do you have difficulty managing or taking your medications?: No  Follow up appointments reviewed: PCP Follow-up appointment confirmed?: No MD Provider Line [phone] Given: Yes Specialist Hospital Follow-up appointment confirmed?: Yes Date of Specialist follow-up appointment?: 06/22/23 Follow-Up Specialty Provider:: Dr Fredricka Bonine Do you need transportation to your  follow-up appointment?: No Do you understand care options if your condition(s) worsen?: Yes-patient verbalized understanding    SIGNATURE  Woodfin Ganja LPN [facility] Nurse Health Advisor Direct Dial [phone]

## 2023-07-05 ENCOUNTER — Other Ambulatory Visit: Payer: Self-pay | Admitting: Surgery

## 2023-07-05 DIAGNOSIS — Z8719 Personal history of other diseases of the digestive system: Secondary | ICD-10-CM

## 2023-07-06 ENCOUNTER — Encounter: Payer: Self-pay | Admitting: Internal Medicine

## 2023-07-06 ENCOUNTER — Ambulatory Visit: Payer: Managed Care, Other (non HMO) | Admitting: Internal Medicine

## 2023-07-06 VITALS — BP 102/64 | HR 78 | Temp 97.8°F | Ht 67.0 in | Wt 162.0 lb

## 2023-07-06 DIAGNOSIS — J0140 Acute pansinusitis, unspecified: Secondary | ICD-10-CM | POA: Diagnosis not present

## 2023-07-06 MED ORDER — BENZONATATE 200 MG PO CAPS: 200.0000 mg | ORAL_CAPSULE | Freq: Three times a day (TID) | ORAL | 0 refills | Status: DC | PRN

## 2023-07-06 MED ORDER — HYDROCODONE BIT-HOMATROP MBR 5-1.5 MG/5ML PO SOLN: 5.0000 mL | Freq: Every evening | ORAL | 0 refills | Status: DC | PRN

## 2023-07-06 NOTE — Assessment & Plan Note (Addendum)
May still be viral Analgesics Will give benzonatate 200 and hycodan to keep her from coughing If worsens, will give augmentin ES for 7 days

## 2023-07-06 NOTE — Progress Notes (Signed)
Subjective:    Patient ID: Abigail Kramer, female    DOB: [birth year], 57 y.o.   MRN: [id number]  HPI Here due to a respiratory infection  Husband with bad respiratory infection Severe cough and wheezing  Now she has some symptoms and is concerned Has used albuterol in the past---with COVID Called [facility]---got benzonatate and albuterol yesterday  Recent hiatal hernia repair---with complications Will need repeat surgery probably She is afraid of things worsening if she coughs bad  Has had some post nasal drip for 5 days or so Tickle in throat Some cough--no sputum (but can feel mucus in there) Some frontal headache---ASA helps Low grade fever---99.6 Bad chills two nights ago No SOB No wheezing  Current Outpatient Medications on File Prior to Visit  Medication Sig Dispense Refill   albuterol (VENTOLIN HFA) 108 (90 Base) MCG/ACT inhaler Inhale 1-2 puffs into the lungs every 6 (six) hours as needed for wheezing or shortness of breath. 8 g 0   buPROPion (WELLBUTRIN XL) 150 MG 24 hr tablet Take 1 tablet (150 mg total) by mouth daily. 90 tablet 3   calcium carbonate (OSCAL) 1500 (600 Ca) MG TABS tablet Take 1,500 mg by mouth daily with breakfast.     cyanocobalamin (VITAMIN B12) 1000 MCG tablet Take 1,000 mcg by mouth daily.     docusate sodium (COLACE) 100 MG capsule Take 1 capsule (100 mg total) by mouth 2 (two) times daily. Okay to decrease to once daily or stop taking if having loose bowel movements 30 capsule 0   Magnesium 250 MG TABS Take 250 mg by mouth daily.     omeprazole (PRILOSEC) 40 MG capsule TAKE 1 CAPSULE BY MOUTH TWICE A DAY 60 capsule 3   ondansetron (ZOFRAN-ODT) 4 MG disintegrating tablet Take 1 tablet (4 mg total) by mouth every 8 (eight) hours as needed for nausea or vomiting. 20 tablet 0   progesterone (PROMETRIUM) 100 MG capsule Take 100 mg by mouth at bedtime.     thyroid (ARMOUR) 30 MG tablet Take 15 mg by mouth daily before breakfast. In 7 days start 30mg       No current facility-administered medications on file prior to visit.    No Known Allergies  Past Medical History:  Diagnosis Date   Anxiety    on meds   Barrett's esophagus    COVID-19    Depression    on meds   Family history of adverse reaction to anesthesia    mother PONV   Fibroid 01/2009   small   GERD (gastroesophageal reflux disease)    on meds   HSV infection    oral - treated by dentist    Hx: UTI (urinary tract infection)    Hypothyroidism    Irregular menses    Ovarian cyst 01/2009 and 04/2009    Past Surgical History:  Procedure Laterality Date   COLONOSCOPY  2011   DB-F/V-movi(good)-normal   ESOPHAGEAL MANOMETRY N/A 05/18/2023   Procedure: ESOPHAGEAL MANOMETRY (EM);  Surgeon: Napoleon Form, MD;  Location: [facility];  Service: Gastroenterology;  Laterality: N/A;   PARTIAL HYSTERECTOMY  2014   TUBAL LIGATION  1991   UPPER GASTROINTESTINAL ENDOSCOPY     VITRECTOMY AND CATARACT Bilateral    WISDOM TOOTH EXTRACTION     XI ROBOTIC ASSISTED HIATAL HERNIA REPAIR N/A 06/13/2023   Procedure: ROBOTIC HIATAL HERNIA REPAIR WITH FUNDOPLICATION, AND UPPER ENDOSCOPY AND A ROBOTIC ASSISTED LAP CHOLECYSTECTOMY;  Surgeon: Berna Bue, MD;  Location: [facility];  Service: General;  Laterality: N/A;    Family History  Problem Relation Age of Onset   Hypertension Mother    Breast cancer Mother    Lymphoma Mother    Myasthenia gravis Father    Colon polyps Brother 15   Hypertension Brother    Colon cancer Neg Hx    Esophageal cancer Neg Hx    Stomach cancer Neg Hx    Rectal cancer Neg Hx    Pancreatic cancer Neg Hx     Social History   Socioeconomic History   Marital status: Married    Spouse name: Not on file   Number of children: 2   Years of education: Not on file   Highest education level: Some college, no degree  Occupational History   Occupation: Airline pilot  Tobacco Use   Smoking status: Never   Smokeless tobacco: Never  Vaping Use   Vaping  status: Never Used  Substance and Sexual Activity   Alcohol use: Yes    Alcohol/week: 4.0 standard drinks of alcohol    Types: 4 Standard drinks or equivalent per week    Comment: occassionally   Drug use: No   Sexual activity: Not on file  Other Topics Concern   Not on file  Social History Narrative   Not on file   Social Determinants of Health   Financial Resource Strain: Low Risk  (04/07/2023)   Overall Financial Resource Strain (CARDIA)    Difficulty of Paying Living Expenses: Not hard at all  Food Insecurity: No Food Insecurity (04/07/2023)   Hunger Vital Sign    Worried About Running Out of Food in the Last Year: Never true    Ran Out of Food in the Last Year: Never true  Transportation Needs: No Transportation Needs (04/07/2023)   PRAPARE - Administrator, Civil Service (Medical): No    Lack of Transportation (Non-Medical): No  Physical Activity: Sufficiently Active (04/07/2023)   Exercise Vital Sign    Days of Exercise per Week: 5 days    Minutes of Exercise per Session: 30 min  Stress: No Stress Concern Present (04/07/2023)   [employer] of Occupational Health - Occupational Stress Questionnaire    Feeling of Stress : Not at all  Social Connections: Socially Integrated (04/07/2023)   Social Connection and Isolation Panel [NHANES]    Frequency of Communication with Friends and Family: More than three times a week    Frequency of Social Gatherings with Friends and Family: More than three times a week    Attends Religious Services: More than 4 times per year    Active Member of [employer] or Organizations: Yes    Attends Engineer, structural: More than 4 times per year    Marital Status: Married  Catering manager Violence: Not on file   Review of Systems COVID test negative yesterday Slight nausea--related to surgery On liquid diet now    Objective:   Physical Exam Constitutional:      Appearance: Normal appearance.  HENT:     Head:      Comments: Mild maxillary tenderness    Right Ear: Tympanic membrane and ear canal normal.     Left Ear: Tympanic membrane and ear canal normal.     Mouth/Throat:     Pharynx: No oropharyngeal exudate or posterior oropharyngeal erythema.  Pulmonary:     Effort: Pulmonary effort is normal.     Breath sounds: Normal breath sounds. No wheezing or rales.  Musculoskeletal:  Cervical back: Neck supple.  Lymphadenopathy:     Cervical: No cervical adenopathy.  Neurological:     Mental Status: She is alert.            Assessment & Plan:

## 2023-07-08 ENCOUNTER — Ambulatory Visit
Admission: RE | Admit: 2023-07-08 | Discharge: 2023-07-08 | Disposition: A | Discharge status: Home / Self Care | Payer: Managed Care, Other (non HMO) | Source: Ambulatory Visit | Attending: Surgery | Admitting: Surgery

## 2023-07-08 ENCOUNTER — Encounter: Payer: Self-pay | Admitting: Internal Medicine

## 2023-07-08 DIAGNOSIS — Z8719 Personal history of other diseases of the digestive system: Secondary | ICD-10-CM

## 2023-07-08 MED ORDER — AMOXICILLIN-POT CLAVULANATE 600-42.9 MG/5ML PO SUSR: 7.5000 mL | Freq: Two times a day (BID) | ORAL | 0 refills | Status: DC

## 2023-07-08 NOTE — Telephone Encounter (Signed)
Patient called back in regarding this,would like to know if she could have something sent in?

## 2023-07-18 ENCOUNTER — Other Ambulatory Visit: Payer: Self-pay | Admitting: Physician Assistant

## 2023-08-02 ENCOUNTER — Telehealth: Payer: Self-pay | Admitting: Gastroenterology

## 2023-08-02 NOTE — Telephone Encounter (Signed)
S/P robotic hiatal hernia repair with toupet fundoplication and cholecystectomy. Surgeon has recommended she have esophageal dilation due to lingering dysphagia. See the Care Everywhere not of 07/28/23. Please advise on scheduling. Thanks.

## 2023-08-02 NOTE — Telephone Encounter (Signed)
Patient called stating we were supposed to have received an "urgent" referral for Dr. Lavon Paganini to do an EGD on her as she was told her esophageal opening was "about the size of a b-b."  She is having difficulty eating and is losing weight.  She last saw Dr. Lavon Paganini in May for an Esophageal Manometry and wanted to see if she could just be set up for an EGD without having to come into the office again.  Please call patient and advise.  Thank you.

## 2023-08-04 ENCOUNTER — Telehealth: Payer: Self-pay | Admitting: Gastroenterology

## 2023-08-04 ENCOUNTER — Encounter: Payer: Self-pay | Admitting: Gastroenterology

## 2023-08-04 NOTE — Telephone Encounter (Signed)
Dr. Mikel Cella with [facility] called to get the notes for EM 5/29 and the EGDs from 11/05/2022 and  04/23/22. Please fax to [phone]

## 2023-08-04 NOTE — Telephone Encounter (Signed)
Records faxed as requested.

## 2023-08-05 NOTE — Telephone Encounter (Signed)
Please schedule next available appointment in office with APP or me soon to discuss plan for EGD with esophageal dilation.  Thanks

## 2023-08-08 NOTE — Telephone Encounter (Signed)
Patient contacted and scheduled.

## 2023-08-15 ENCOUNTER — Other Ambulatory Visit (INDEPENDENT_AMBULATORY_CARE_PROVIDER_SITE_OTHER): Payer: Managed Care, Other (non HMO)

## 2023-08-15 ENCOUNTER — Ambulatory Visit (INDEPENDENT_AMBULATORY_CARE_PROVIDER_SITE_OTHER): Payer: Managed Care, Other (non HMO) | Admitting: Nurse Practitioner

## 2023-08-15 ENCOUNTER — Encounter: Payer: Self-pay | Admitting: Nurse Practitioner

## 2023-08-15 VITALS — BP 124/70 | HR 88 | Ht 67.0 in | Wt 154.6 lb

## 2023-08-15 DIAGNOSIS — K222 Esophageal obstruction: Secondary | ICD-10-CM

## 2023-08-15 DIAGNOSIS — R1319 Other dysphagia: Secondary | ICD-10-CM

## 2023-08-15 LAB — CBC
HCT: 42.4 % (ref 36.0–46.0)
Hemoglobin: 13.8 g/dL (ref 12.0–15.0)
MCHC: 32.5 g/dL (ref 30.0–36.0)
MCV: 90.5 fl (ref 78.0–100.0)
Platelets: 317 10*3/uL (ref 150.0–400.0)
RBC: 4.68 Mil/uL (ref 3.87–5.11)
RDW: 15.1 % (ref 11.5–15.5)
WBC: 9.4 10*3/uL (ref 4.0–10.5)

## 2023-08-15 LAB — COMPREHENSIVE METABOLIC PANEL
ALT: 13 U/L (ref 0–35)
AST: 17 U/L (ref 0–37)
Albumin: 4.3 g/dL (ref 3.5–5.2)
Alkaline Phosphatase: 66 U/L (ref 39–117)
BUN: 14 mg/dL (ref 6–23)
CO2: 26 mEq/L (ref 19–32)
Calcium: 9.1 mg/dL (ref 8.4–10.5)
Chloride: 100 mEq/L (ref 96–112)
Creatinine, Ser: 0.65 mg/dL (ref 0.40–1.20)
GFR: 97.36 mL/min (ref >60.00)
Glucose, Bld: 109 mg/dL — ABNORMAL HIGH (ref 70–99)
Potassium: 3.7 mEq/L (ref 3.5–5.1)
Sodium: 137 mEq/L (ref 135–145)
Total Bilirubin: 0.7 mg/dL (ref 0.2–1.2)
Total Protein: 7.5 g/dL (ref 6.0–8.3)

## 2023-08-15 NOTE — Patient Instructions (Signed)
Your provider has requested that you go to the basement level for lab work before leaving today. Press "B" on the elevator. The lab is located at the first door on the left as you exit the elevator.   You have been scheduled for an endoscopy. Please follow written instructions given to you at your visit today.  If you use inhalers (even only as needed), please bring them with you on the day of your procedure.  If you take any of the following medications, they will need to be adjusted prior to your procedure:   DO NOT TAKE 7 DAYS PRIOR TO TEST- Trulicity (dulaglutide) Ozempic, Wegovy (semaglutide) Mounjaro (tirzepatide) Bydureon Bcise (exanatide extended release)  DO NOT TAKE 1 DAY PRIOR TO YOUR TEST Rybelsus (semaglutide) Adlyxin (lixisenatide) Victoza (liraglutide) Byetta (exanatide) ___________________________________________________________________________    Due to recent changes in healthcare laws, you may see the results of your imaging and laboratory studies on MyChart before your provider has had a chance to review them.  We understand that in some cases there may be results that are confusing or concerning to you. Not all laboratory results come back in the same time frame and the provider may be waiting for multiple results in order to interpret others.  Please give [place] 48 hours in order for your provider to thoroughly review all the results before contacting the office for clarification of your results.

## 2023-08-15 NOTE — Progress Notes (Unsigned)
     08/15/2023 Abigail Kramer [id number] 1966-06-23   Chief Complaint:  History of Present Illness: Abigail Kramer is a 57 year old female s/p robotic hernia repair with fundoplication and cholecystectomy 06/22/2023  She is eating soups, soft scrambled eggs, mushy vegetables.  Symptoms are more severe.   Docusate sodium   Protein   23 lbs since June.   Amoxicillin dry heaves, 1 dose, ear infection prior to surgery, Augmentin 10 days. Off 5 days.   Wrap too tight, ? Reverse surgery, observed overnight, steroid Dexa, repeat xray looked ok.   EGD 08/26/2023  S/P robotic hiatal hernia repair with toupet fundoplication and cholecystectomy. Surgeon has recommended she have esophageal dilation due to lingering dysphagia. See the Care Everywhere not of 07/28/23. Please advise on schedulin   EGD 11/05/2022: - Esophageal mucosal changes secondary to established short-segment Barrett's disease. Biopsied. - Benign-appearing esophageal stenosis. Dilated. - 5 cm hiatal hernia. - Gastritis. Biopsied. - Normal examined duodenum.  Colonoscopy 09/16/2021: - One 4 mm polyp in the transverse colon, removed with a cold snare. Resected and retrieved. - One less than 1 mm polyp in the transverse colon, removed with a cold biopsy forceps. Resected and retrieved. - Non-bleeding internal hemorrhoids.  - 7 years  TUBULAR ADENOMA (1 OF 2 FRAGMENTS) - BENIGN COLONIC MUCOSA (1 OF 2 FRAGMENTS) - NO HIGH-GRADE DYSPLASIA OR MALIGNANCY IDENTIFIED   Current Medications, Allergies, Past Medical History, Past Surgical History, Family History and Social History were reviewed in [employer] record.   Review of Systems:   Constitutional: Negative for fever, sweats, chills or weight loss.  Respiratory: Negative for shortness of breath.   Cardiovascular: Negative for chest pain, palpitations and leg swelling.  Gastrointestinal: See HPI.  Musculoskeletal: Negative for back pain or muscle  aches.  Neurological: Negative for dizziness, headaches or paresthesias.    Physical Exam: BP 124/70   Pulse 88   Ht 5\' 7"  (1.702 m)   Wt 154 lb 9.6 oz (70.1 kg)   LMP 12/20/2008   SpO2 99%   BMI 24.21 kg/m   Wt Readings from Last 3 Encounters:  08/15/23 154 lb 9.6 oz (70.1 kg)  07/06/23 162 lb (73.5 kg)  06/13/23 173 lb (78.5 kg)    General: in no acute distress. Head: Normocephalic and atraumatic. Eyes: No scleral icterus. Conjunctiva pink . Ears: Normal auditory acuity. Mouth: Dentition intact. No ulcers or lesions.  Lungs: Clear throughout to auscultation. Heart: Regular rate and rhythm, no murmur. Abdomen: Soft, nontender and nondistended. No masses or hepatomegaly. Normal bowel sounds x 4 quadrants.  Rectal: *** Musculoskeletal: Symmetrical with no gross deformities. Extremities: No edema. Neurological: Alert oriented x 4. No focal deficits.  Psychological: Alert and cooperative. Normal mood and affect  Assessment and Recommendations: ***

## 2023-08-16 ENCOUNTER — Encounter: Payer: Self-pay | Admitting: Nurse Practitioner

## 2023-08-16 ENCOUNTER — Encounter: Payer: Self-pay | Admitting: Gastroenterology

## 2023-08-26 ENCOUNTER — Ambulatory Visit: Payer: Managed Care, Other (non HMO) | Admitting: Gastroenterology

## 2023-08-26 ENCOUNTER — Encounter: Payer: Self-pay | Admitting: Gastroenterology

## 2023-08-26 VITALS — BP 111/54 | HR 63 | Temp 98.4°F | Resp 13 | Ht 67.0 in | Wt 154.0 lb

## 2023-08-26 DIAGNOSIS — K2970 Gastritis, unspecified, without bleeding: Secondary | ICD-10-CM

## 2023-08-26 DIAGNOSIS — K319 Disease of stomach and duodenum, unspecified: Secondary | ICD-10-CM | POA: Diagnosis not present

## 2023-08-26 DIAGNOSIS — K2270 Barrett's esophagus without dysplasia: Secondary | ICD-10-CM

## 2023-08-26 DIAGNOSIS — R1310 Dysphagia, unspecified: Secondary | ICD-10-CM

## 2023-08-26 MED ORDER — SODIUM CHLORIDE 0.9 % IV SOLN: 500.0000 mL | Freq: Once | INTRAVENOUS | Status: DC

## 2023-08-26 NOTE — Op Note (Signed)
[facility] Patient Name: Abigail Kramer Procedure Date: 08/26/2023 3:56 PM MRN: [id number] Endoscopist: Napoleon Form , MD, [id number] Age: 57 Referring MD:  Date of Birth: 1966/08/14 Gender: Female Account #: [id number] Procedure:                Upper GI endoscopy Indications:              Dysphagia Medicines:                Monitored Anesthesia Care Procedure:                Pre-Anesthesia Assessment:                           - Prior to the procedure, a History and Physical                            was performed, and patient medications and                            allergies were reviewed. The patient's tolerance of                            previous anesthesia was also reviewed. The risks                            and benefits of the procedure and the sedation                            options and risks were discussed with the patient.                            All questions were answered, and informed consent                            was obtained. Prior Anticoagulants: The patient has                            taken no anticoagulant or antiplatelet agents. ASA                            Grade Assessment: II - A patient with mild systemic                            disease. After reviewing the risks and benefits,                            the patient was deemed in satisfactory condition to                            undergo the procedure.                           After obtaining informed consent, the endoscope was  passed under direct vision. Throughout the                            procedure, the patient's blood pressure, pulse, and                            oxygen saturations were monitored continuously. The                            GIF [id number] #[id number] was introduced through the                            mouth, and advanced to the second part of duodenum.                            The upper GI endoscopy was accomplished  without                            difficulty. The patient tolerated the procedure                            well. Scope In: Scope Out: Findings:                 No endoscopic abnormality was evident in the                            esophagus to explain the patient's complaint of                            dysphagia. It was decided, however, to proceed with                            dilation of the lower third of the esophagus. A TTS                            dilator was passed through the scope. Dilation with                            an 18-19-20 mm x 8 cm CRE balloon dilator was                            performed to 20 mm. The dilation site was examined                            following endoscope reinsertion and showed no                            change.                           There were esophageal mucosal changes suspicious  for short-segment Barrett's esophagus present at                            the gastroesophageal junction. The maximum                            longitudinal extent of these mucosal changes was 3                            cm in length. Mucosa was biopsied with a cold                            forceps for histology in a targeted manner at                            intervals of 1 cm from 33 to 36 cm from the                            incisors. One specimen bottle was sent to pathology.                           Evidence of a prior Toupet fundoplication was found                            in the cardia.                           Patchy mild inflammation characterized by                            congestion (edema) and erythema was found in the                            gastric antrum and in the prepyloric region of the                            stomach. Biopsies were taken with a cold forceps                            for Helicobacter pylori testing.                           The examined duodenum was  normal. Complications:            No immediate complications. Estimated Blood Loss:     Estimated blood loss was minimal. Impression:               - No endoscopic esophageal abnormality to explain                            patient's dysphagia. Esophagus dilated. Dilated.                           - Esophageal mucosal changes suspicious for  short-segment Barrett's esophagus. Biopsied.                           - An a Toupet fundoplication was found.                           - Gastritis. Biopsied.                           - Normal examined duodenum. Recommendation:           - Patient has a contact number available for                            emergencies. The signs and symptoms of potential                            delayed complications were discussed with the                            patient. Return to normal activities tomorrow.                            Written discharge instructions were provided to the                            patient.                           - Resume previous diet.                           - Continue present medications.                           - Await pathology results.                           - Follow an antireflux regimen. Napoleon Form, MD 08/26/2023 4:29:46 PM This report has been signed electronically.

## 2023-08-26 NOTE — Patient Instructions (Signed)
YOU HAD AN ENDOSCOPIC PROCEDURE TODAY: Refer to the procedure report and other information in the discharge instructions given to you for any specific questions about what was found during the examination. If this information does not answer your questions, please call [place] office at [phone] to clarify.  ° °YOU SHOULD EXPECT: Some feelings of bloating in the abdomen. Passage of more gas than usual. Walking can help get rid of the air that was put into your GI tract during the procedure and reduce the bloating. If you had a lower endoscopy (such as a colonoscopy or flexible sigmoidoscopy) you may notice spotting of blood in your stool or on the toilet paper. Some abdominal soreness may be present for a day or two, also. ° °DIET: Your first meal following the procedure should be a light meal and then it is ok to progress to your normal diet. A half-sandwich or bowl of soup is an example of a good first meal. Heavy or fried foods are harder to digest and may make you feel nauseous or bloated. Drink plenty of fluids but you should avoid alcoholic beverages for 24 hours. If you had a esophageal dilation, please see attached instructions for diet.   ° °ACTIVITY: Your care partner should take you home directly after the procedure. You should plan to take it easy, moving slowly for the rest of the day. You can resume normal activity the day after the procedure however YOU SHOULD NOT DRIVE, use power tools, machinery or perform tasks that involve climbing or major physical exertion for 24 hours (because of the sedation medicines used during the test).  ° °SYMPTOMS TO REPORT IMMEDIATELY: °A gastroenterologist can be reached at any hour. Please call [phone]  for any of the following symptoms:  °• Following lower endoscopy (colonoscopy, flexible sigmoidoscopy) °Excessive amounts of blood in the stool  °Significant tenderness, worsening of abdominal pains  °Swelling of the abdomen that is new, acute  °Fever of 100°  or higher  °• Following upper endoscopy (EGD, EUS, ERCP, esophageal dilation) °Vomiting of blood or coffee ground material  °New, significant abdominal pain  °New, significant chest pain or pain under the shoulder blades  °Painful or persistently difficult swallowing  °New shortness of breath  °Black, tarry-looking or red, bloody stools ° °FOLLOW UP:  °If any biopsies were taken you will be contacted by phone or by letter within the next 1-3 weeks. Call [phone]  if you have not heard about the biopsies in 3 weeks.  °Please also call with any specific questions about appointments or follow up tests.YOU HAD AN ENDOSCOPIC PROCEDURE TODAY: Refer to the procedure report and other information in the discharge instructions given to you for any specific questions about what was found during the examination. If this information does not answer your questions, please call [place] office at [phone] to clarify.  ° °YOU SHOULD EXPECT: Some feelings of bloating in the abdomen. Passage of more gas than usual. Walking can help get rid of the air that was put into your GI tract during the procedure and reduce the bloating. If you had a lower endoscopy (such as a colonoscopy or flexible sigmoidoscopy) you may notice spotting of blood in your stool or on the toilet paper. Some abdominal soreness may be present for a day or two, also. ° °DIET: Your first meal following the procedure should be a light meal and then it is ok to progress to your normal diet. A half-sandwich or bowl of soup is an example   of a good first meal. Heavy or fried foods are harder to digest and may make you feel nauseous or bloated. Drink plenty of fluids but you should avoid alcoholic beverages for 24 hours. If you had a esophageal dilation, please see attached instructions for diet.   ° °ACTIVITY: Your care partner should take you home directly after the procedure. You should plan to take it easy, moving slowly for the rest of the day. You can resume  normal activity the day after the procedure however YOU SHOULD NOT DRIVE, use power tools, machinery or perform tasks that involve climbing or major physical exertion for 24 hours (because of the sedation medicines used during the test).  ° °SYMPTOMS TO REPORT IMMEDIATELY: °A gastroenterologist can be reached at any hour. Please call [phone]  for any of the following symptoms:  °• Following upper endoscopy (EGD, EUS, ERCP, esophageal dilation) °Vomiting of blood or coffee ground material  °New, significant abdominal pain  °New, significant chest pain or pain under the shoulder blades  °Painful or persistently difficult swallowing  °New shortness of breath  °Black, tarry-looking or red, bloody stools ° °FOLLOW UP:  °If any biopsies were taken you will be contacted by phone or by letter within the next 1-3 weeks. Call [phone]  if you have not heard about the biopsies in 3 weeks.  °Please also call with any specific questions about appointments or follow up tests. °

## 2023-08-26 NOTE — Progress Notes (Unsigned)
[name] History and Physical   Primary Care Physician:  Tower, Audrie Gallus, MD   Reason for Procedure:  Dysphagia  Plan:    EGD  with possible interventions as needed     HPI: Abigail Kramer is a very pleasant 57 y.o. female here for EGD with esophageal dilation for dysphagia s/p fundoplication.   The risks and benefits as well as alternatives of endoscopic procedure(s) have been discussed and reviewed. All questions answered. The patient agrees to proceed.    Past Medical History:  Diagnosis Date   Anxiety    on meds   Barrett's esophagus    COVID-19    Depression    on meds   Family history of adverse reaction to anesthesia    mother PONV   Fibroid 01/2009   small   GERD (gastroesophageal reflux disease)    on meds   HSV infection    oral - treated by dentist    Hx: UTI (urinary tract infection)    Hypothyroidism    Irregular menses    Ovarian cyst 01/2009 and 04/2009    Past Surgical History:  Procedure Laterality Date   CHOLECYSTECTOMY     COLONOSCOPY  2011   DB-F/V-movi(good)-normal   ESOPHAGEAL MANOMETRY N/A 05/18/2023   Procedure: ESOPHAGEAL MANOMETRY (EM);  Surgeon: Napoleon Form, MD;  Location: [facility];  Service: Gastroenterology;  Laterality: N/A;   PARTIAL HYSTERECTOMY  2014   TUBAL LIGATION  1991   UPPER GASTROINTESTINAL ENDOSCOPY     VITRECTOMY AND CATARACT Bilateral    WISDOM TOOTH EXTRACTION     XI ROBOTIC ASSISTED HIATAL HERNIA REPAIR N/A 06/13/2023   Procedure: ROBOTIC HIATAL HERNIA REPAIR WITH FUNDOPLICATION, AND UPPER ENDOSCOPY AND A ROBOTIC ASSISTED LAP CHOLECYSTECTOMY;  Surgeon: Berna Bue, MD;  Location: [facility];  Service: General;  Laterality: N/A;    Prior to Admission medications   Medication Sig Start Date End Date Taking? Authorizing Provider  buPROPion (WELLBUTRIN XL) 150 MG 24 hr tablet Take 1 tablet (150 mg total) by mouth daily. 12/29/22  Yes Tower, Audrie Gallus, MD  calcium carbonate (OSCAL) 1500 (600  Ca) MG TABS tablet Take 1,500 mg by mouth daily with breakfast.   Yes [provider]  cyanocobalamin (VITAMIN B12) 1000 MCG tablet Take 1,000 mcg by mouth daily.   Yes [provider]  Magnesium 250 MG TABS Take 250 mg by mouth daily.   Yes [provider]  omeprazole (PRILOSEC) 40 MG capsule TAKE 1 CAPSULE BY MOUTH TWICE A DAY 07/18/23  Yes Mishawn Didion, Eleonore Chiquito, MD  thyroid (ARMOUR) 30 MG tablet Take 15 mg by mouth daily before breakfast. In 7 days start 30mg    Yes [provider]  albuterol (VENTOLIN HFA) 108 (90 Base) MCG/ACT inhaler Inhale 1-2 puffs into the lungs every 6 (six) hours as needed for wheezing or shortness of breath. Patient not taking: Reported on 08/26/2023 12/24/21   Freddy Finner, NP  ondansetron (ZOFRAN-ODT) 4 MG disintegrating tablet Take 1 tablet (4 mg total) by mouth every 8 (eight) hours as needed for nausea or vomiting. Patient not taking: Reported on 08/26/2023 06/14/23   Berna Bue, MD  progesterone (PROMETRIUM) 100 MG capsule Take 100 mg by mouth at bedtime. 04/24/23   [provider]    Current Outpatient Medications  Medication Sig Dispense Refill   buPROPion (WELLBUTRIN XL) 150 MG 24 hr tablet Take 1 tablet (150 mg total) by mouth daily. 90 tablet 3   calcium carbonate (  OSCAL) 1500 (600 Ca) MG TABS tablet Take 1,500 mg by mouth daily with breakfast.     cyanocobalamin (VITAMIN B12) 1000 MCG tablet Take 1,000 mcg by mouth daily.     Magnesium 250 MG TABS Take 250 mg by mouth daily.     omeprazole (PRILOSEC) 40 MG capsule TAKE 1 CAPSULE BY MOUTH TWICE A DAY 60 capsule 5   thyroid (ARMOUR) 30 MG tablet Take 15 mg by mouth daily before breakfast. In 7 days start 30mg      albuterol (VENTOLIN HFA) 108 (90 Base) MCG/ACT inhaler Inhale 1-2 puffs into the lungs every 6 (six) hours as needed for wheezing or shortness of breath. (Patient not taking: Reported on 08/26/2023) 8 g 0   ondansetron (ZOFRAN-ODT) 4 MG disintegrating tablet  Take 1 tablet (4 mg total) by mouth every 8 (eight) hours as needed for nausea or vomiting. (Patient not taking: Reported on 08/26/2023) 20 tablet 0   progesterone (PROMETRIUM) 100 MG capsule Take 100 mg by mouth at bedtime.     Current Facility-Administered Medications  Medication Dose Route Frequency Provider Last Rate Last Admin   0.9 %  sodium chloride infusion  500 mL Intravenous Once Napoleon Form, MD        Allergies as of 08/26/2023   (No Known Allergies)    Family History  Problem Relation Age of Onset   Hypertension Mother    Breast cancer Mother    Lymphoma Mother    Myasthenia gravis Father    Colon polyps Brother 67   Hypertension Brother    Colon cancer Neg Hx    Esophageal cancer Neg Hx    Stomach cancer Neg Hx    Rectal cancer Neg Hx    Pancreatic cancer Neg Hx     Social History   Socioeconomic History   Marital status: Married    Spouse name: Not on file   Number of children: 2   Years of education: Not on file   Highest education level: Some college, no degree  Occupational History   Occupation: Airline pilot  Tobacco Use   Smoking status: Never   Smokeless tobacco: Never  Vaping Use   Vaping status: Never Used  Substance and Sexual Activity   Alcohol use: Yes    Alcohol/week: 4.0 standard drinks of alcohol    Types: 4 Standard drinks or equivalent per week    Comment: occassionally   Drug use: No   Sexual activity: Not on file  Other Topics Concern   Not on file  Social History Narrative   Not on file   Social Determinants of Health   Financial Resource Strain: Low Risk  (04/07/2023)   Overall Financial Resource Strain (CARDIA)    Difficulty of Paying Living Expenses: Not hard at all  Food Insecurity: No Food Insecurity (04/07/2023)   Hunger Vital Sign    Worried About Running Out of Food in the Last Year: Never true    Ran Out of Food in the Last Year: Never true  Transportation Needs: No Transportation Needs (04/07/2023)   PRAPARE -  Administrator, Civil Service (Medical): No    Lack of Transportation (Non-Medical): No  Physical Activity: Sufficiently Active (04/07/2023)   Exercise Vital Sign    Days of Exercise per Week: 5 days    Minutes of Exercise per Session: 30 min  Stress: No Stress Concern Present (04/07/2023)   [employer] of Occupational Health - Occupational Stress Questionnaire    Feeling of Stress :  Not at all  Social Connections: Socially Integrated (04/07/2023)   Social Connection and Isolation Panel [NHANES]    Frequency of Communication with Friends and Family: More than three times a week    Frequency of Social Gatherings with Friends and Family: More than three times a week    Attends Religious Services: More than 4 times per year    Active Member of [employer] or Organizations: Yes    Attends Engineer, structural: More than 4 times per year    Marital Status: Married  Catering manager Violence: Not on file    Review of Systems:  All other review of systems negative except as mentioned in the HPI.  Physical Exam: Vital signs in last 24 hours: BP (!) 149/100   Pulse 76   Temp 98.4 F (36.9 C)   Ht 5\' 7"  (1.702 m)   Wt 154 lb (69.9 kg)   LMP 12/20/2008   SpO2 100%   BMI 24.12 kg/m  General:   Alert, NAD Lungs:  Clear .   Heart:  Regular rate and rhythm Abdomen:  Soft, nontender and nondistended. Neuro/Psych:  Alert and cooperative. Normal mood and affect. A and O x 3  Reviewed labs, radiology imaging, old records and pertinent past GI work up  Patient is appropriate for planned procedure(s) and anesthesia in an ambulatory setting   K. Scherry Ran , MD [phone]

## 2023-08-26 NOTE — Progress Notes (Signed)
Pt's states no medical or surgical changes since previsit or office visit. 

## 2023-08-26 NOTE — Progress Notes (Unsigned)
Report to PACU, RN, vss, BBS= Clear.  

## 2023-08-26 NOTE — Progress Notes (Unsigned)
Called to room to assist during endoscopic procedure.  Patient ID and intended procedure confirmed with present staff. Received instructions for my participation in the procedure from the performing physician.  

## 2023-08-29 ENCOUNTER — Telehealth: Payer: Self-pay | Admitting: *Deleted

## 2023-08-29 NOTE — Telephone Encounter (Signed)
  Follow up Call-     08/26/2023    3:30 PM 11/05/2022    2:58 PM 04/23/2022    1:10 PM 09/16/2021   10:18 AM  Call back number  Post procedure Call Back phone  # [phone]- [phone]  Permission to leave phone message Yes Yes Yes Yes     Patient questions:  Do you have a fever, pain , or abdominal swelling? No. Pain Score  0 *  Have you tolerated food without any problems? Yes.    Have you been able to return to your normal activities? Yes.    Do you have any questions about your discharge instructions: Diet   No. Medications  No. Follow up visit  No.  Do you have questions or concerns about your Care? No.  Actions: * If pain score is 4 or above: No action needed, pain <4.

## 2023-08-31 LAB — SURGICAL PATHOLOGY

## 2023-09-12 ENCOUNTER — Other Ambulatory Visit: Payer: Self-pay | Admitting: Family Medicine

## 2023-09-13 ENCOUNTER — Encounter: Payer: Self-pay | Admitting: Gastroenterology

## 2023-09-20 ENCOUNTER — Encounter: Payer: Managed Care, Other (non HMO) | Admitting: Gastroenterology

## 2023-10-21 ENCOUNTER — Encounter: Payer: Self-pay | Admitting: Family Medicine

## 2023-10-21 ENCOUNTER — Ambulatory Visit (INDEPENDENT_AMBULATORY_CARE_PROVIDER_SITE_OTHER): Payer: Managed Care, Other (non HMO) | Admitting: Family Medicine

## 2023-10-21 VITALS — BP 136/88 | HR 71 | Temp 98.2°F | Ht 67.0 in | Wt 150.8 lb

## 2023-10-21 DIAGNOSIS — H6993 Unspecified Eustachian tube disorder, bilateral: Secondary | ICD-10-CM

## 2023-10-21 DIAGNOSIS — H6990 Unspecified Eustachian tube disorder, unspecified ear: Secondary | ICD-10-CM | POA: Insufficient documentation

## 2023-10-21 DIAGNOSIS — U071 COVID-19: Secondary | ICD-10-CM | POA: Diagnosis not present

## 2023-10-21 NOTE — Assessment & Plan Note (Signed)
Almost a week out of symptom start (was out of town)  Relatively mild Discussed headache and nausea yesterday (possible migraine type)-now resolved Today ETD symptoms-will treat with steroid ns  Discussed symptom care-see AVS Discussed isolation and masking protocols Update if not starting to improve in a week or if worsening  Call back and Er precautions noted in detail today

## 2023-10-21 NOTE — Patient Instructions (Signed)
Use flonase 2 sprays in each nostril daily for at least 2 weeks  This should decompress ears and help prevent a sinus infection   Nasal saline is fine also   Drink lots of fluids  If the headache returns and does not resolve let us know   Keep isolating until symptoms are better  Then continue to mask for 10 more days   Gets some rest   Update if not starting to improve in a week or if worsening

## 2023-10-21 NOTE — Progress Notes (Signed)
Subjective:    Patient ID: Abigail Kramer, female    DOB: 09/01/1966, 57 y.o.   MRN: [id number]  HPI  Wt Readings from Last 3 Encounters:  10/21/23 150 lb 12.8 oz (68.4 kg)  08/26/23 154 lb (69.9 kg)  08/15/23 154 lb 9.6 oz (70.1 kg)   23.62 kg/m  Vitals:   10/21/23 1205  BP: 136/88  Pulse: 71  Temp: 98.2 F (36.8 C)  SpO2: 99%    Pt presents for c/o ear fullness in setting of recent covid 19  Tested positive for covid on 10/27 (on vacation, husband had it also) Symptoms started on 24th   Yesterday overall felt like symptoms were improved  Then 3 pm got a headache and ear pressure  (above right eye and back of neck)  Some nausea  Dizzy- not full blown vertigo however (worried about that)  Took some meclizine and zofran   Today ear pressure  No pain  Worse on right side   Today not dizzy Headache is better      Over the counter  Netti pot No steroid ns now     Patient Active Problem List   Diagnosis Date Noted   ETD (eustachian tube dysfunction) 10/21/2023   S/P repair of paraesophageal hernia 06/13/2023   Hair loss 04/08/2023   Lipid screening 04/08/2023   Current use of proton pump inhibitor 12/29/2022   Epigastric abdominal pain 02/12/2022   Left lower quadrant abdominal pain 02/12/2022   Gastroesophageal reflux disease 06/08/2021   Colon cancer screening 06/08/2021   Breast mass, left 01/30/2021   Elevated BP without diagnosis of hypertension 05/20/2020   COVID-19 11/22/2019   Positive ANA (antinuclear antibody) 01/26/2017   Generalized anxiety disorder 10/22/2015   Fatigue 10/18/2014   HSV (herpes simplex virus) infection 10/18/2014   Raynaud phenomenon 10/18/2014   Symptoms, such as flushing, sleeplessness, headache, lack of concentration, associated with the menopause 12/25/2013   Routine general medical examination at a health care facility 04/08/2011   Past Medical History:  Diagnosis Date   Anxiety    on meds   Barrett's esophagus     COVID-19    Depression    on meds   Family history of adverse reaction to anesthesia    mother PONV   Fibroid 01/2009   small   GERD (gastroesophageal reflux disease)    on meds   HSV infection    oral - treated by dentist    Hx: UTI (urinary tract infection)    Hypothyroidism    Irregular menses    Ovarian cyst 01/2009 and 04/2009   Past Surgical History:  Procedure Laterality Date   CHOLECYSTECTOMY     COLONOSCOPY  2011   DB-F/V-movi(good)-normal   ESOPHAGEAL MANOMETRY N/A 05/18/2023   Procedure: ESOPHAGEAL MANOMETRY (EM);  Surgeon: Napoleon Form, MD;  Location: [facility];  Service: Gastroenterology;  Laterality: N/A;   PARTIAL HYSTERECTOMY  2014   TUBAL LIGATION  1991   UPPER GASTROINTESTINAL ENDOSCOPY     VITRECTOMY AND CATARACT Bilateral    WISDOM TOOTH EXTRACTION     XI ROBOTIC ASSISTED HIATAL HERNIA REPAIR N/A 06/13/2023   Procedure: ROBOTIC HIATAL HERNIA REPAIR WITH FUNDOPLICATION, AND UPPER ENDOSCOPY AND A ROBOTIC ASSISTED LAP CHOLECYSTECTOMY;  Surgeon: Berna Bue, MD;  Location: [facility];  Service: General;  Laterality: N/A;   Social History   Tobacco Use   Smoking status: Never   Smokeless tobacco: Never  Vaping Use   Vaping status: Never Used  Substance Use Topics   Alcohol use: Yes    Alcohol/week: 4.0 standard drinks of alcohol    Types: 4 Standard drinks or equivalent per week    Comment: occassionally   Drug use: No   Family History  Problem Relation Age of Onset   Hypertension Mother    Breast cancer Mother    Lymphoma Mother    Myasthenia gravis Father    Colon polyps Brother 58   Hypertension Brother    Colon cancer Neg Hx    Esophageal cancer Neg Hx    Stomach cancer Neg Hx    Rectal cancer Neg Hx    Pancreatic cancer Neg Hx    Allergies  Allergen Reactions   Amoxicillin Diarrhea and Nausea And Vomiting   Augmentin [Amoxicillin-Pot Clavulanate] Diarrhea and Nausea And Vomiting   Current Outpatient Medications on File  Prior to Visit  Medication Sig Dispense Refill   albuterol (VENTOLIN HFA) 108 (90 Base) MCG/ACT inhaler Inhale 1-2 puffs into the lungs every 6 (six) hours as needed for wheezing or shortness of breath. 8 g 0   buPROPion (WELLBUTRIN XL) 150 MG 24 hr tablet Take 1 tablet (150 mg total) by mouth daily. 90 tablet 3   calcium carbonate (OSCAL) 1500 (600 Ca) MG TABS tablet Take 1,500 mg by mouth daily with breakfast.     cyanocobalamin (VITAMIN B12) 1000 MCG tablet Take 1,000 mcg by mouth daily.     fluticasone (FLONASE) 50 MCG/ACT nasal spray Place 2 sprays into both nostrils daily.     Magnesium 250 MG TABS Take 250 mg by mouth daily.     omeprazole (PRILOSEC) 40 MG capsule TAKE 1 CAPSULE BY MOUTH TWICE A DAY 60 capsule 5   ondansetron (ZOFRAN-ODT) 4 MG disintegrating tablet Take 1 tablet (4 mg total) by mouth every 8 (eight) hours as needed for nausea or vomiting. 20 tablet 0   thyroid (ARMOUR) 30 MG tablet Take 15 mg by mouth daily before breakfast. In 7 days start 30mg      No current facility-administered medications on file prior to visit.    Review of Systems     Objective:   Physical Exam Constitutional:      General: She is not in acute distress.    Appearance: Normal appearance. She is well-developed. She is not ill-appearing, toxic-appearing or diaphoretic.  HENT:     Head: Normocephalic and atraumatic.     Comments: Nares are injected and congested      Right Ear: Tympanic membrane, ear canal and external ear normal.     Left Ear: Tympanic membrane, ear canal and external ear normal.     Ears:     Comments: TMs are dull but clear  No bulging on erythema     Nose: Congestion and rhinorrhea present.     Mouth/Throat:     Mouth: Mucous membranes are moist.     Pharynx: Oropharynx is clear. No oropharyngeal exudate or posterior oropharyngeal erythema.     Comments: Clear pnd  Eyes:     General:        Right eye: No discharge.        Left eye: No discharge.      Conjunctiva/sclera: Conjunctivae normal.     Pupils: Pupils are equal, round, and reactive to light.     Comments: No nystagmus   Cardiovascular:     Rate and Rhythm: Normal rate.     Heart sounds: Normal heart sounds.  Pulmonary:     Effort:  Pulmonary effort is normal. No respiratory distress.     Breath sounds: Normal breath sounds. No stridor. No wheezing, rhonchi or rales.     Comments: Good air exch  Chest:     Chest wall: No tenderness.  Musculoskeletal:     Cervical back: Normal range of motion and neck supple.  Lymphadenopathy:     Cervical: No cervical adenopathy.  Skin:    General: Skin is warm and dry.     Capillary Refill: Capillary refill takes less than 2 seconds.     Findings: No rash.  Neurological:     Mental Status: She is alert.     Cranial Nerves: No cranial nerve deficit.     Motor: No weakness.  Psychiatric:        Mood and Affect: Mood normal.           Assessment & Plan:   Problem List Items Addressed This Visit       Nervous and Auditory   ETD (eustachian tube dysfunction) - Primary    In setting of mild covid 19 symptoms  Reassuring exam Discussed symptoms care-see AVS Will start flonase daily for min 2 weeks  Saline irrigation as needed   Pt has dramamine on hand for dizziness if needed Update if not starting to improve in a week or if worsening  Call back and Er precautions noted in detail today          Other   COVID-19    Almost a week out of symptom start (was out of town)  Relatively mild Discussed headache and nausea yesterday (possible migraine type)-now resolved Today ETD symptoms-will treat with steroid ns  Discussed symptom care-see AVS Discussed isolation and masking protocols Update if not starting to improve in a week or if worsening  Call back and Er precautions noted in detail today

## 2023-10-21 NOTE — Assessment & Plan Note (Signed)
In setting of mild covid 19 symptoms  Reassuring exam Discussed symptoms care-see AVS Will start flonase daily for min 2 weeks  Saline irrigation as needed   Pt has dramamine on hand for dizziness if needed Update if not starting to improve in a week or if worsening  Call back and Er precautions noted in detail today

## 2023-12-16 ENCOUNTER — Other Ambulatory Visit: Payer: Self-pay | Admitting: Family Medicine

## 2024-01-15 ENCOUNTER — Other Ambulatory Visit: Payer: Self-pay | Admitting: Gastroenterology

## 2024-02-27 LAB — HM MAMMOGRAPHY

## 2024-03-01 ENCOUNTER — Encounter: Payer: Self-pay | Admitting: Family Medicine

## 2024-03-17 ENCOUNTER — Other Ambulatory Visit: Payer: Self-pay | Admitting: Family Medicine

## 2024-06-12 ENCOUNTER — Other Ambulatory Visit: Payer: Self-pay | Admitting: Family Medicine

## 2024-06-12 NOTE — Telephone Encounter (Signed)
 Pt is overdue for her CPE (labs prior) please schedule and route back to me to refill. Thanks

## 2024-06-12 NOTE — Telephone Encounter (Signed)
 Patient is scheduled last part of July for CPE and prior labs

## 2024-07-08 ENCOUNTER — Telehealth: Payer: Self-pay | Admitting: Family Medicine

## 2024-07-08 DIAGNOSIS — Z79899 Other long term (current) drug therapy: Secondary | ICD-10-CM

## 2024-07-08 DIAGNOSIS — Z0000 Encounter for general adult medical examination without abnormal findings: Secondary | ICD-10-CM

## 2024-07-08 DIAGNOSIS — Z13220 Encounter for screening for lipoid disorders: Secondary | ICD-10-CM

## 2024-07-08 DIAGNOSIS — R7309 Other abnormal glucose: Secondary | ICD-10-CM | POA: Insufficient documentation

## 2024-07-08 NOTE — Telephone Encounter (Signed)
-----   Message from Veva JINNY Ferrari sent at 06/19/2024  3:34 PM EDT ----- Regarding: Lab orders for Mon, 7.21.25 Patient is scheduled for CPX labs, please order future labs, Thanks , Veva

## 2024-07-09 ENCOUNTER — Ambulatory Visit: Payer: Self-pay | Admitting: Family Medicine

## 2024-07-09 ENCOUNTER — Other Ambulatory Visit (INDEPENDENT_AMBULATORY_CARE_PROVIDER_SITE_OTHER)

## 2024-07-09 DIAGNOSIS — Z13220 Encounter for screening for lipoid disorders: Secondary | ICD-10-CM

## 2024-07-09 DIAGNOSIS — Z79899 Other long term (current) drug therapy: Secondary | ICD-10-CM

## 2024-07-09 DIAGNOSIS — Z0000 Encounter for general adult medical examination without abnormal findings: Secondary | ICD-10-CM | POA: Diagnosis not present

## 2024-07-09 DIAGNOSIS — R7309 Other abnormal glucose: Secondary | ICD-10-CM

## 2024-07-09 LAB — COMPREHENSIVE METABOLIC PANEL WITH GFR
ALT: 44 U/L — ABNORMAL HIGH (ref 0–35)
AST: 29 U/L (ref 0–37)
Albumin: 4.5 g/dL (ref 3.5–5.2)
Alkaline Phosphatase: 51 U/L (ref 39–117)
BUN: 17 mg/dL (ref 6–23)
CO2: 28 meq/L (ref 19–32)
Calcium: 9.1 mg/dL (ref 8.4–10.5)
Chloride: 101 meq/L (ref 96–112)
Creatinine, Ser: 0.75 mg/dL (ref 0.40–1.20)
GFR: 87.48 mL/min (ref >60.00)
Glucose, Bld: 89 mg/dL (ref 70–99)
Potassium: 4.4 meq/L (ref 3.5–5.1)
Sodium: 137 meq/L (ref 135–145)
Total Bilirubin: 0.7 mg/dL (ref 0.2–1.2)
Total Protein: 7 g/dL (ref 6.0–8.3)

## 2024-07-09 LAB — HEMOGLOBIN A1C: Hgb A1c MFr Bld: 5.6 % (ref 4.6–6.5)

## 2024-07-09 LAB — LIPID PANEL
Cholesterol: 224 mg/dL — ABNORMAL HIGH (ref 0–200)
HDL: 87.4 mg/dL (ref >39.00)
LDL Cholesterol: 119 mg/dL — ABNORMAL HIGH (ref 0–99)
NonHDL: 136.89
Total CHOL/HDL Ratio: 3
Triglycerides: 89 mg/dL (ref 0.0–149.0)
VLDL: 17.8 mg/dL (ref 0.0–40.0)

## 2024-07-09 LAB — CBC WITH DIFFERENTIAL/PLATELET
Basophils Absolute: 0 K/uL (ref 0.0–0.1)
Basophils Relative: 0.6 % (ref 0.0–3.0)
Eosinophils Absolute: 0.1 K/uL (ref 0.0–0.7)
Eosinophils Relative: 1.1 % (ref 0.0–5.0)
HCT: 39.8 % (ref 36.0–46.0)
Hemoglobin: 13.4 g/dL (ref 12.0–15.0)
Lymphocytes Relative: 34.4 % (ref 12.0–46.0)
Lymphs Abs: 2.2 K/uL (ref 0.7–4.0)
MCHC: 33.6 g/dL (ref 30.0–36.0)
MCV: 90.7 fl (ref 78.0–100.0)
Monocytes Absolute: 0.4 K/uL (ref 0.1–1.0)
Monocytes Relative: 5.9 % (ref 3.0–12.0)
Neutro Abs: 3.6 K/uL (ref 1.4–7.7)
Neutrophils Relative %: 58 % (ref 43.0–77.0)
Platelets: 284 K/uL (ref 150.0–400.0)
RBC: 4.39 Mil/uL (ref 3.87–5.11)
RDW: 14 % (ref 11.5–15.5)
WBC: 6.3 K/uL (ref 4.0–10.5)

## 2024-07-09 LAB — TSH: TSH: 0.96 u[IU]/mL (ref 0.35–5.50)

## 2024-07-09 LAB — VITAMIN B12: Vitamin B-12: 894 pg/mL (ref 211–911)

## 2024-07-09 LAB — VITAMIN D 25 HYDROXY (VIT D DEFICIENCY, FRACTURES): VITD: 34.24 ng/mL (ref 30.00–100.00)

## 2024-07-16 ENCOUNTER — Encounter: Payer: Self-pay | Admitting: Family Medicine

## 2024-07-16 ENCOUNTER — Ambulatory Visit (INDEPENDENT_AMBULATORY_CARE_PROVIDER_SITE_OTHER): Admitting: Family Medicine

## 2024-07-16 VITALS — BP 136/72 | HR 77 | Temp 97.9°F | Ht 66.5 in | Wt 151.0 lb

## 2024-07-16 DIAGNOSIS — Z23 Encounter for immunization: Secondary | ICD-10-CM | POA: Diagnosis not present

## 2024-07-16 DIAGNOSIS — Z79899 Other long term (current) drug therapy: Secondary | ICD-10-CM

## 2024-07-16 DIAGNOSIS — Z1211 Encounter for screening for malignant neoplasm of colon: Secondary | ICD-10-CM

## 2024-07-16 DIAGNOSIS — R7309 Other abnormal glucose: Secondary | ICD-10-CM | POA: Diagnosis not present

## 2024-07-16 DIAGNOSIS — R7401 Elevation of levels of liver transaminase levels: Secondary | ICD-10-CM | POA: Insufficient documentation

## 2024-07-16 DIAGNOSIS — Z0000 Encounter for general adult medical examination without abnormal findings: Secondary | ICD-10-CM | POA: Diagnosis not present

## 2024-07-16 DIAGNOSIS — F411 Generalized anxiety disorder: Secondary | ICD-10-CM

## 2024-07-16 DIAGNOSIS — K219 Gastro-esophageal reflux disease without esophagitis: Secondary | ICD-10-CM

## 2024-07-16 NOTE — Assessment & Plan Note (Signed)
 Doing ok  Taking wellbutrin   Bio id HRT helps from [facility]

## 2024-07-16 NOTE — Assessment & Plan Note (Signed)
 Reviewed health habits including diet and exercise and skin cancer prevention Reviewed appropriate screening tests for age  Also reviewed health mt list, fam hx and immunization status , as well as social and family history   See HPI Labs reviewed and ordered Health Maintenance  Topic Date Due   HIV Screening  Never done   Hepatitis B Vaccine (1 of 3 - 19+ 3-dose series) Never done   Zoster (Shingles) Vaccine (1 of 2) Never done   DTaP/Tdap/Td vaccine (3 - Td or Tdap) 06/19/2022   COVID-19 Vaccine (4 - 2024-25 season) 08/01/2024*   Flu Shot  07/20/2024   Mammogram  02/26/2025   Colon Cancer Screening  09/16/2028   Hepatitis C Screening  Completed   HPV Vaccine  Aged Out   Meningitis B Vaccine  Aged Out  *Topic was postponed. The date shown is not the original due date.    Td today  Plans to get shingrix/check on coverage Gets HRT with [facility], aware of risks with fam history of breast cancer  Discussed fall prevention, supplements and exercise for bone density  PHQ 0

## 2024-07-16 NOTE — Progress Notes (Signed)
 Subjective:    Patient ID: Abigail Kramer, female    DOB: 07/10/1966, 58 y.o.   MRN: [id number]  HPI  Here for health maintenance exam and to review chronic medical problems   Wt Readings from Last 3 Encounters:  07/16/24 151 lb (68.5 kg)  10/21/23 150 lb 12.8 oz (68.4 kg)  08/26/23 154 lb (69.9 kg)   24.01 kg/m  Vitals:   07/16/24 0921  BP: 136/72  Pulse: 77  Temp: 97.9 F (36.6 C)  SpO2: 99%    Immunization History  Administered Date(s) Administered   H1N1 01/17/2009   Influenza Inj Mdck Quad Pf 11/02/2021   Influenza Whole 12/19/2006, 09/20/2007   Influenza, Quadrivalent, Recombinant, Inj, Pf 09/08/2018   Influenza, Seasonal, Injecte, Preservative Fre 09/23/2023   Influenza,inj,Quad PF,6+ Mos 10/22/2015, 01/26/2017, 08/28/2019, 11/19/2020   Influenza-Unspecified 09/18/2013, 10/15/2014, 09/23/2017   Moderna Sars-Covid-2 Vaccination 03/17/2020, 04/14/2020, 12/29/2020   Td 12/19/2006, 07/16/2024   Tdap 06/19/2012    Health Maintenance Due  Topic Date Due   HIV Screening  Never done   Hepatitis B Vaccines (1 of 3 - 19+ 3-dose series) Never done   Zoster Vaccines- Shingrix (1 of 2) Never done   Tetanus shot 06/2012 Tdap    Shingrix - interested / will check on coverage   Feeling pretty good   Mammogram 02/2024 Mother had breast cancer (in 90s) , also has lymphoma  Self breast exam- no lumps   Gyn health Partial hysterectomy 2014 for uterine cysts?  Does not see gyn  Does have some menopause symptoms Goes to [facility] - gets estrogen and testosterone  pellets-synthetic  Takes progesterone  orally    Colon cancer screening -colonoscopy 08/2021   Bone health   Falls- none  Fractures-none  Supplements -has not been taking D  Last vitamin D  Lab Results  Component Value Date   VD25OH 34.24 07/09/2024    Exercise  Uses a rebounder  Pilates  Paddle boarding  Weighted vest  Has a total gym -needs to use it    Mood    07/16/2024    9:21  AM 10/21/2023   12:09 PM 04/08/2023   10:52 AM 12/29/2022   12:41 PM 06/08/2021    2:11 PM  Depression screen PHQ 2/9  Decreased Interest 0 0 0 1 0  Down, Depressed, Hopeless 0 0 0 1 0  PHQ - 2 Score 0 0 0 2 0  Altered sleeping  0 0 1 0  Tired, decreased energy  0 1 1 0  Change in appetite  0 0 0 0  Feeling bad or failure about yourself   0 0 0 0  Trouble concentrating  0 0 3 0  Moving slowly or fidgety/restless  0 0 1 0  Suicidal thoughts  0 0 0 0  PHQ-9 Score  0 1 8 0  Difficult doing work/chores   Not difficult at all Very difficult Not difficult at all      10/21/2023   12:10 PM 04/08/2023   10:53 AM 12/29/2022   12:42 PM  GAD 7 : Generalized Anxiety Score  Nervous, Anxious, on Edge 0 0 1  Control/stop worrying 0 0 1  Worry too much - different things 0 0 2  Trouble relaxing 0 0 1  Restless 0 0 0  Easily annoyed or irritable 0 0 2  Afraid - awful might happen 0 0 1  Total GAD 7 Score 0 0 8  Anxiety Difficulty  Not difficult at all Very  difficult      Wellbutrin  xl 150 mg daily- does help  Good and bad days   On omeprazole  40 mg bid GERD Dysphasia  Under care of GI  Lab Results  Component Value Date   VITAMINB12 894 07/09/2024  Takes B12   Glucose Lab Results  Component Value Date   HGBA1C 5.6 07/09/2024     Lab Results  Component Value Date   NA 137 07/09/2024   K 4.4 07/09/2024   CO2 28 07/09/2024   GLUCOSE 89 07/09/2024   BUN 17 07/09/2024   CREATININE 0.75 07/09/2024   CALCIUM 9.1 07/09/2024   GFR 87.48 07/09/2024   GFRNONAA >60 06/14/2023   Lab Results  Component Value Date   WBC 6.3 07/09/2024   HGB 13.4 07/09/2024   HCT 39.8 07/09/2024   MCV 90.7 07/09/2024   PLT 284.0 07/09/2024   Lab Results  Component Value Date   ALT 44 (H) 07/09/2024   AST 29 07/09/2024   ALKPHOS 51 07/09/2024   BILITOT 0.7 07/09/2024   Lab Results  Component Value Date   TSH 0.96 07/09/2024   Taking armour thyroid   [facility] as well  Not diagnosed  with hypothyroid    Takes tylenol  - few times a week Some excedrin 3-4 glasses of wine per week        Cholesterol Lab Results  Component Value Date   CHOL 224 (H) 07/09/2024   CHOL 253 (H) 04/08/2023   CHOL 238 (H) 05/29/2021   Lab Results  Component Value Date   HDL 87.40 07/09/2024   HDL 92.10 04/08/2023   HDL 94.80 05/29/2021   Lab Results  Component Value Date   LDLCALC 119 (H) 07/09/2024   LDLCALC 138 (H) 04/08/2023   LDLCALC 123 (H) 05/29/2021   Lab Results  Component Value Date   TRIG 89.0 07/09/2024   TRIG 111.0 04/08/2023   TRIG 103.0 05/29/2021   Lab Results  Component Value Date   CHOLHDL 3 07/09/2024   CHOLHDL 3 04/08/2023   CHOLHDL 3 05/29/2021   Lab Results  Component Value Date   LDLDIRECT 99.3 12/18/2013   LDLDIRECT 90.3 01/17/2009   Eating really healthy  No red meat  Lots of veggies    Patient Active Problem List   Diagnosis Date Noted   Elevated ALT measurement 07/16/2024   Elevated glucose level 07/08/2024   S/P repair of paraesophageal hernia 06/13/2023   Hair loss 04/08/2023   Lipid screening 04/08/2023   Current use of proton pump inhibitor 12/29/2022   Left lower quadrant abdominal pain 02/12/2022   Gastroesophageal reflux disease 06/08/2021   Colon cancer screening 06/08/2021   Elevated BP without diagnosis of hypertension 05/20/2020   Positive ANA (antinuclear antibody) 01/26/2017   Generalized anxiety disorder 10/22/2015   HSV (herpes simplex virus) infection 10/18/2014   Raynaud phenomenon 10/18/2014   Symptoms, such as flushing, sleeplessness, headache, lack of concentration, associated with the menopause 12/25/2013   Routine general medical examination at a health care facility 04/08/2011   Past Medical History:  Diagnosis Date   Anxiety    on meds   Barrett's esophagus    COVID-19    Depression    on meds   Family history of adverse reaction to anesthesia    mother PONV   Fibroid 01/2009   small   GERD  (gastroesophageal reflux disease)    on meds   HSV infection    oral - treated by dentist    Hx:  UTI (urinary tract infection)    Hypothyroidism    Irregular menses    Ovarian cyst 01/2009 and 04/2009   Past Surgical History:  Procedure Laterality Date   CHOLECYSTECTOMY     COLONOSCOPY  2011   DB-F/V-movi(good)-normal   ESOPHAGEAL MANOMETRY N/A 05/18/2023   Procedure: ESOPHAGEAL MANOMETRY (EM);  Surgeon: Shila Gustav GAILS, MD;  Location: [facility];  Service: Gastroenterology;  Laterality: N/A;   PARTIAL HYSTERECTOMY  2014   TUBAL LIGATION  1991   UPPER GASTROINTESTINAL ENDOSCOPY     VITRECTOMY AND CATARACT Bilateral    WISDOM TOOTH EXTRACTION     XI ROBOTIC ASSISTED HIATAL HERNIA REPAIR N/A 06/13/2023   Procedure: ROBOTIC HIATAL HERNIA REPAIR WITH FUNDOPLICATION, AND UPPER ENDOSCOPY AND A ROBOTIC ASSISTED LAP CHOLECYSTECTOMY;  Surgeon: Signe Mitzie LABOR, MD;  Location: [facility];  Service: General;  Laterality: N/A;   Social History   Tobacco Use   Smoking status: Never   Smokeless tobacco: Never  Vaping Use   Vaping status: Never Used  Substance Use Topics   Alcohol use: Yes    Alcohol/week: 4.0 standard drinks of alcohol    Types: 4 Standard drinks or equivalent per week    Comment: occassionally   Drug use: No   Family History  Problem Relation Age of Onset   Hypertension Mother    Breast cancer Mother    Lymphoma Mother    Non-Hodgkin's lymphoma Mother    Myasthenia gravis Father    Colon polyps Brother 67   Hypertension Brother    Colon cancer Neg Hx    Esophageal cancer Neg Hx    Stomach cancer Neg Hx    Rectal cancer Neg Hx    Pancreatic cancer Neg Hx    Allergies  Allergen Reactions   Amoxicillin  Diarrhea and Nausea And Vomiting   Augmentin  [Amoxicillin -Pot Clavulanate] Diarrhea and Nausea And Vomiting   Current Outpatient Medications on File Prior to Visit  Medication Sig Dispense Refill   buPROPion  (WELLBUTRIN  XL) 150 MG 24 hr tablet TAKE 1 TABLET  BY MOUTH EVERY DAY 30 tablet 1   cyanocobalamin  (VITAMIN B12) 1000 MCG tablet Take 1,000 mcg by mouth daily.     Magnesium 250 MG TABS Take 250 mg by mouth daily.     omeprazole  (PRILOSEC) 40 MG capsule TAKE 1 CAPSULE BY MOUTH TWICE A DAY 60 capsule 5   spironolactone (ALDACTONE) 100 MG tablet Take 100 mg by mouth daily.     thyroid  (ARMOUR) 30 MG tablet Take 15 mg by mouth daily before breakfast. In 7 days start 30mg      albuterol  (VENTOLIN  HFA) 108 (90 Base) MCG/ACT inhaler Inhale 1-2 puffs into the lungs every 6 (six) hours as needed for wheezing or shortness of breath. (Patient not taking: Reported on 07/16/2024) 8 g 0   calcium carbonate (OSCAL) 1500 (600 Ca) MG TABS tablet Take 1,500 mg by mouth daily with breakfast. (Patient not taking: Reported on 07/16/2024)     fluticasone (FLONASE) 50 MCG/ACT nasal spray Place 2 sprays into both nostrils daily. (Patient not taking: Reported on 07/16/2024)     ondansetron  (ZOFRAN -ODT) 4 MG disintegrating tablet Take 1 tablet (4 mg total) by mouth every 8 (eight) hours as needed for nausea or vomiting. (Patient not taking: Reported on 07/16/2024) 20 tablet 0   No current facility-administered medications on file prior to visit.    Review of Systems  Constitutional:  Negative for activity change, appetite change, fatigue, fever and unexpected weight change.  HENT:  Negative for congestion, ear pain, rhinorrhea, sinus pressure and sore throat.   Eyes:  Negative for pain, redness and visual disturbance.  Respiratory:  Negative for cough, shortness of breath and wheezing.   Cardiovascular:  Negative for chest pain and palpitations.  Gastrointestinal:  Negative for abdominal pain, blood in stool, constipation and diarrhea.  Endocrine: Negative for polydipsia and polyuria.  Genitourinary:  Negative for dysuria, frequency and urgency.  Musculoskeletal:  Negative for arthralgias, back pain and myalgias.  Skin:  Negative for pallor and rash.   Allergic/Immunologic: Negative for environmental allergies.  Neurological:  Negative for dizziness, syncope and headaches.  Hematological:  Negative for adenopathy. Does not bruise/bleed easily.  Psychiatric/Behavioral:  Negative for decreased concentration and dysphoric mood. The patient is not nervous/anxious.        Objective:   Physical Exam Constitutional:      General: She is not in acute distress.    Appearance: Normal appearance. She is well-developed. She is not ill-appearing or diaphoretic.  HENT:     Head: Normocephalic and atraumatic.     Right Ear: Tympanic membrane, ear canal and external ear normal.     Left Ear: Tympanic membrane, ear canal and external ear normal.     Nose: Nose normal. No congestion.     Mouth/Throat:     Mouth: Mucous membranes are moist.     Pharynx: Oropharynx is clear. No posterior oropharyngeal erythema.  Eyes:     General: No scleral icterus.    Extraocular Movements: Extraocular movements intact.     Conjunctiva/sclera: Conjunctivae normal.     Pupils: Pupils are equal, round, and reactive to light.  Neck:     Thyroid : No thyromegaly.     Vascular: No carotid bruit or JVD.  Cardiovascular:     Rate and Rhythm: Normal rate and regular rhythm.     Pulses: Normal pulses.     Heart sounds: Normal heart sounds.     No gallop.  Pulmonary:     Effort: Pulmonary effort is normal. No respiratory distress.     Breath sounds: Normal breath sounds. No wheezing.     Comments: Good air exch Chest:     Chest wall: No tenderness.  Abdominal:     General: Bowel sounds are normal. There is no distension or abdominal bruit.     Palpations: Abdomen is soft. There is no mass.     Tenderness: There is no abdominal tenderness.     Hernia: No hernia is present.  Genitourinary:    Comments: Breast exam: No mass, nodules, thickening, tenderness, bulging, retraction, inflamation, nipple discharge or skin changes noted.  No axillary or clavicular LA.      Musculoskeletal:        General: No tenderness. Normal range of motion.     Cervical back: Normal range of motion and neck supple. No rigidity. No muscular tenderness.     Right lower leg: No edema.     Left lower leg: No edema.     Comments: No kyphosis   Lymphadenopathy:     Cervical: No cervical adenopathy.  Skin:    General: Skin is warm and dry.     Coloration: Skin is not pale.     Findings: No erythema or rash.     Comments: Solar lentigines diffusely   Neurological:     Mental Status: She is alert. Mental status is at baseline.     Cranial Nerves: No cranial nerve deficit.     Motor: No  abnormal muscle tone.     Coordination: Coordination normal.     Gait: Gait normal.     Deep Tendon Reflexes: Reflexes are normal and symmetric. Reflexes normal.  Psychiatric:        Mood and Affect: Mood normal.        Cognition and Memory: Cognition and memory normal.           Assessment & Plan:   Problem List Items Addressed This Visit       Digestive   Gastroesophageal reflux disease   Continues high dose omeprazole  for GERD with Barretts S/p surgery for [facility]  Ongoing GI care        Other   Routine general medical examination at a health care facility - Primary   Reviewed health habits including diet and exercise and skin cancer prevention Reviewed appropriate screening tests for age  Also reviewed health mt list, fam hx and immunization status , as well as social and family history   See HPI Labs reviewed and ordered Health Maintenance  Topic Date Due   HIV Screening  Never done   Hepatitis B Vaccine (1 of 3 - 19+ 3-dose series) Never done   Zoster (Shingles) Vaccine (1 of 2) Never done   DTaP/Tdap/Td vaccine (3 - Td or Tdap) 06/19/2022   COVID-19 Vaccine (4 - 2024-25 season) 08/01/2024*   Flu Shot  07/20/2024   Mammogram  02/26/2025   Colon Cancer Screening  09/16/2028   Hepatitis C Screening  Completed   HPV Vaccine  Aged Out   Meningitis B Vaccine  Aged  Out  *Topic was postponed. The date shown is not the original due date.    Td today  Plans to get shingrix/check on coverage Gets HRT with [facility], aware of risks with fam history of breast cancer  Discussed fall prevention, supplements and exercise for bone density  PHQ 0      Generalized anxiety disorder   Doing ok  Taking wellbutrin   Bio id HRT helps from [facility]      Elevated glucose level   Lab Results  Component Value Date   HGBA1C 5.6 07/09/2024   Commended on good diet       Elevated ALT measurement   Lab Results  Component Value Date   ALT 44 (H) 07/09/2024   AST 29 07/09/2024   ALKPHOS 51 07/09/2024   BILITOT 0.7 07/09/2024   Encouraged to  Minimize acetaminophen  and alcohol Is on testosterone -will d/w [facility]  Us  2023 normal with small gb polyp (noted no follow up req)  , no evidence of fatty liver  Re check 1-2 mo        Current use of proton pump inhibitor   Taking B12 Lab Results  Component Value Date   VITAMINB12 894 07/09/2024    May be life long for Barretts'      Colon cancer screening   Colonoscopy 08/2021       Other Visit Diagnoses       Need for prophylactic vaccination with tetanus-diphtheria (Td)       Relevant Orders   Td : Tetanus/diphtheria >7yo Preservative  free (Completed)

## 2024-07-16 NOTE — Assessment & Plan Note (Signed)
Colonoscopy 08/2021.  

## 2024-07-16 NOTE — Patient Instructions (Addendum)
 If you are interested in the shingles vaccine series (Shingrix), call your insurance or pharmacy to check on coverage and location it must be given.  If affordable - you can schedule it here or at your pharmacy depending on coverage   Td today   Get back on some vitamin D3   2000 international units daily   Stay active Any cardio is good  Add some strength training to your routine, this is important for bone and brain health and can reduce your risk of falls and help your body use insulin properly and regulate weight  Light weights, exercise bands , and internet videos are a good way to start  Yoga (chair or regular), machines , floor exercises or a gym with machines are also good options   One liver test is very slightly elevated Minimize tylenol  / acetaminophen  Minimize alcohol   ? If testosterone  could play a role-talk to your [facility]  Let' re check liver labs in 1-2 months   Keep eating healthy

## 2024-07-16 NOTE — Assessment & Plan Note (Signed)
 Continues high dose omeprazole  for GERD with Barretts S/p surgery for [facility]  Ongoing GI care

## 2024-07-16 NOTE — Assessment & Plan Note (Signed)
 Taking B12 Lab Results  Component Value Date   VITAMINB12 894 07/09/2024    May be life long for Barretts'

## 2024-07-16 NOTE — Assessment & Plan Note (Signed)
 Lab Results  Component Value Date   HGBA1C 5.6 07/09/2024   Commended on good diet

## 2024-07-16 NOTE — Assessment & Plan Note (Addendum)
 Lab Results  Component Value Date   ALT 44 (H) 07/09/2024   AST 29 07/09/2024   ALKPHOS 51 07/09/2024   BILITOT 0.7 07/09/2024   Encouraged to  Minimize acetaminophen  and alcohol Is on testosterone -will d/w [facility]  [facility]  2023 normal with small gb polyp (noted no follow up req)  , no evidence of fatty liver  Re check 1-2 mo

## 2024-08-10 ENCOUNTER — Other Ambulatory Visit: Payer: Self-pay | Admitting: Family Medicine

## 2024-08-13 ENCOUNTER — Other Ambulatory Visit (INDEPENDENT_AMBULATORY_CARE_PROVIDER_SITE_OTHER)

## 2024-08-13 ENCOUNTER — Ambulatory Visit: Payer: Self-pay | Admitting: Family Medicine

## 2024-08-13 DIAGNOSIS — R7401 Elevation of levels of liver transaminase levels: Secondary | ICD-10-CM | POA: Diagnosis not present

## 2024-08-13 LAB — HEPATIC FUNCTION PANEL
ALT: 24 U/L (ref 0–35)
AST: 21 U/L (ref 0–37)
Albumin: 4.4 g/dL (ref 3.5–5.2)
Alkaline Phosphatase: 49 U/L (ref 39–117)
Bilirubin, Direct: 0.1 mg/dL (ref 0.0–0.3)
Total Bilirubin: 0.6 mg/dL (ref 0.2–1.2)
Total Protein: 7.4 g/dL (ref 6.0–8.3)

## 2024-09-03 ENCOUNTER — Other Ambulatory Visit
